# Patient Record
Sex: Female | Born: 1995
Health system: Southern US, Community
[De-identification: ages and names within clinical notes are randomized; demographics above are authoritative.]

## PROBLEM LIST (undated history)

## (undated) DIAGNOSIS — K219 Gastro-esophageal reflux disease without esophagitis: Secondary | ICD-10-CM

## (undated) DIAGNOSIS — O24419 Gestational diabetes mellitus in pregnancy, unspecified control: Secondary | ICD-10-CM

## (undated) DIAGNOSIS — R197 Diarrhea, unspecified: Secondary | ICD-10-CM

## (undated) DIAGNOSIS — I10 Essential (primary) hypertension: Secondary | ICD-10-CM

## (undated) DIAGNOSIS — E739 Lactose intolerance, unspecified: Secondary | ICD-10-CM

## (undated) DIAGNOSIS — F419 Anxiety disorder, unspecified: Secondary | ICD-10-CM

## (undated) HISTORY — PX: CHOLECYSTECTOMY: SHX55

## (undated) HISTORY — DX: Gestational diabetes mellitus in pregnancy, unspecified control: O24.419

## (undated) HISTORY — PX: OTHER SURGICAL HISTORY: SHX169

## (undated) HISTORY — DX: Lactose intolerance, unspecified: E73.9

## (undated) HISTORY — PX: WISDOM TOOTH EXTRACTION: SHX21

## (undated) HISTORY — DX: Essential (primary) hypertension: I10

---
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
## 2014-08-10 ENCOUNTER — Ambulatory Visit (INDEPENDENT_AMBULATORY_CARE_PROVIDER_SITE_OTHER): Payer: 59 | Admitting: Advanced Practice Midwife

## 2014-08-10 ENCOUNTER — Encounter: Payer: Self-pay | Admitting: Advanced Practice Midwife

## 2014-08-10 VITALS — BP 132/98 | Ht 66.0 in | Wt 240.0 lb

## 2014-08-10 DIAGNOSIS — Z309 Encounter for contraceptive management, unspecified: Secondary | ICD-10-CM

## 2014-08-10 DIAGNOSIS — Z3009 Encounter for other general counseling and advice on contraception: Secondary | ICD-10-CM

## 2014-08-10 MED ORDER — MISOPROSTOL 200 MCG PO TABS: 600.0000 ug | ORAL_TABLET | Freq: Once | ORAL | Status: DC

## 2014-08-10 NOTE — Patient Instructions (Signed)
Levonorgestrel intrauterine device (IUD) What is this medicine? LEVONORGESTREL IUD (LEE voe nor jes trel) is a contraceptive (birth control) device. The device is placed inside the uterus by a healthcare professional. It is used to prevent pregnancy and can also be used to treat heavy bleeding that occurs during your period. Depending on the device, it can be used for 3 to 5 years. This medicine may be used for other purposes; ask your health care provider or pharmacist if you have questions. COMMON BRAND NAME(S): LILETTA, Mirena, Skyla What should I tell my health care provider before I take this medicine? They need to know if you have any of these conditions: -abnormal Pap smear -cancer of the breast, uterus, or cervix -diabetes -endometritis -genital or pelvic infection now or in the past -have more than one sexual partner or your partner has more than one partner -heart disease -history of an ectopic or tubal pregnancy -immune system problems -IUD in place -liver disease or tumor -problems with blood clots or take blood-thinners -use intravenous drugs -uterus of unusual shape -vaginal bleeding that has not been explained -an unusual or allergic reaction to levonorgestrel, other hormones, silicone, or polyethylene, medicines, foods, dyes, or preservatives -pregnant or trying to get pregnant -breast-feeding How should I use this medicine? This device is placed inside the uterus by a health care professional. Talk to your pediatrician regarding the use of this medicine in children. Special care may be needed. Overdosage: If you think you have taken too much of this medicine contact a poison control center or emergency room at once. NOTE: This medicine is only for you. Do not share this medicine with others. What if I miss a dose? This does not apply. What may interact with this medicine? Do not take this medicine with any of the following  medications: -amprenavir -bosentan -fosamprenavir This medicine may also interact with the following medications: -aprepitant -barbiturate medicines for inducing sleep or treating seizures -bexarotene -griseofulvin -medicines to treat seizures like carbamazepine, ethotoin, felbamate, oxcarbazepine, phenytoin, topiramate -modafinil -pioglitazone -rifabutin -rifampin -rifapentine -some medicines to treat HIV infection like atazanavir, indinavir, lopinavir, nelfinavir, tipranavir, ritonavir -St. John's wort -warfarin This list may not describe all possible interactions. Give your health care provider a list of all the medicines, herbs, non-prescription drugs, or dietary supplements you use. Also tell them if you smoke, drink alcohol, or use illegal drugs. Some items may interact with your medicine. What should I watch for while using this medicine? Visit your doctor or health care professional for regular check ups. See your doctor if you or your partner has sexual contact with others, becomes HIV positive, or gets a sexual transmitted disease. This product does not protect you against HIV infection (AIDS) or other sexually transmitted diseases. You can check the placement of the IUD yourself by reaching up to the top of your vagina with clean fingers to feel the threads. Do not pull on the threads. It is a good habit to check placement after each menstrual period. Call your doctor right away if you feel more of the IUD than just the threads or if you cannot feel the threads at all. The IUD may come out by itself. You may become pregnant if the device comes out. If you notice that the IUD has come out use a backup birth control method like condoms and call your health care provider. Using tampons will not change the position of the IUD and are okay to use during your period. What side effects may   I notice from receiving this medicine? Side effects that you should report to your doctor or  health care professional as soon as possible: -allergic reactions like skin rash, itching or hives, swelling of the face, lips, or tongue -fever, flu-like symptoms -genital sores -high blood pressure -no menstrual period for 6 weeks during use -pain, swelling, warmth in the leg -pelvic pain or tenderness -severe or sudden headache -signs of pregnancy -stomach cramping -sudden shortness of breath -trouble with balance, talking, or walking -unusual vaginal bleeding, discharge -yellowing of the eyes or skin Side effects that usually do not require medical attention (report to your doctor or health care professional if they continue or are bothersome): -acne -breast pain -change in sex drive or performance -changes in weight -cramping, dizziness, or faintness while the device is being inserted -headache -irregular menstrual bleeding within first 3 to 6 months of use -nausea This list may not describe all possible side effects. Call your doctor for medical advice about side effects. You may report side effects to FDA at 1-800-FDA-1088. Where should I keep my medicine? This does not apply. NOTE: This sheet is a summary. It may not cover all possible information. If you have questions about this medicine, talk to your doctor, pharmacist, or health care provider.  2015, Elsevier/Gold Standard. (2011-08-28 13:54:04)  

## 2014-08-10 NOTE — Progress Notes (Signed)
[facility] Visit  Patient name: Alexandra Jones MRN [id number]  Date of birth: March 03, 1996  CC & HPI:  Alexandra Jones is a 18 y.o. Caucasian female presenting today for to discuss Mirena.  She is currently on COC's and is going abroad this summer--doesn't want to have to worry about taking a pill everyday and does not want Nexplanon  Pertinent History Reviewed:  Medical & Surgical Hx:   History reviewed. No pertinent past medical history. Past Surgical History  Procedure Laterality Date  . Cholecystectomy     Current outpatient prescriptions: norethindrone-ethinyl estradiol (MICROGESTIN,JUNEL,LOESTRIN) 1-20 MG-MCG tablet, Take 1 tablet by mouth daily., Disp: , Rfl: ;  misoprostol (CYTOTEC) 200 MCG tablet, Take 3 tablets (600 mcg total) by mouth once. 6 hours before your appointment, Disp: 3 tablet, Rfl: 0 Social History: Reviewed -  reports that she has never smoked. She has never used smokeless tobacco.  Objective Findings:  Vitals: BP 132/98 mmHg  Ht 5\' 6"  (1.676 m)  Wt 240 lb (108.863 kg)  BMI 38.76 kg/m2  LMP 07/11/2014  No results found for this or any previous visit (from the past 24 hour(s)).   Assessment & Plan:  A:   Contraception counseling P:  Cytotec 66mcg PO 6 hours before appt   F/U 1 week for IUD(lyletta).  Pt will be on her period then   CRESENZO-DISHMAN,Mamie Hundertmark CNM 08/10/2014 3:34 PM

## 2014-08-17 ENCOUNTER — Encounter: Payer: Self-pay | Admitting: Obstetrics and Gynecology

## 2014-08-17 ENCOUNTER — Ambulatory Visit (INDEPENDENT_AMBULATORY_CARE_PROVIDER_SITE_OTHER): Payer: 59 | Admitting: Obstetrics and Gynecology

## 2014-08-17 VITALS — BP 140/82 | Ht 66.0 in | Wt 236.0 lb

## 2014-08-17 DIAGNOSIS — Z30014 Encounter for initial prescription of intrauterine contraceptive device: Secondary | ICD-10-CM

## 2014-08-17 DIAGNOSIS — Z3200 Encounter for pregnancy test, result unknown: Secondary | ICD-10-CM

## 2014-08-17 DIAGNOSIS — Z3202 Encounter for pregnancy test, result negative: Secondary | ICD-10-CM

## 2014-08-17 DIAGNOSIS — Z113 Encounter for screening for infections with a predominantly sexual mode of transmission: Secondary | ICD-10-CM

## 2014-08-17 LAB — POCT URINE PREGNANCY: Preg Test, Ur: NEGATIVE

## 2014-08-17 NOTE — Progress Notes (Signed)
Patient ID: Alexandra Jones, female   DOB: 10/18/1995, 19 y.o.   MRN: [id number] Pt here today for IUD insertion. Pt last had intercourse on 08/05/14, Pt stopped taking her BCP on Saturday and started her period yesterday. Pt had a negative UPT today.  [place] PROCEDURE NOTE  Alexandra Jones is a 19 y.o. No obstetric history on file. here for Mirena IUD insertion. No GYN concerns.  No paps in past will do GC/ CHl  IUD Insertion Procedure Note Patient identified, informed consent performed, consent signed.   Discussed risks of irregular bleeding, cramping, infection, malpositioning or misplacement of the IUD outside the uterus which may require further procedure such as laparoscopy. Time out was performed.  Urine pregnancy test negative.  Speculum placed in the vagina.  Cervix visualized.  Cleaned with Betadine x 2.  Grasped anteriorly with a single tooth tenaculum.  Uterus sounded to 7 cm. Liletta IUD placed per manufacturer's recommendations.  Strings trimmed to 3 cm. Tenaculum was removed, good hemostasis noted.  Patient tolerated procedure well.  The prongs appear incompletely spread on u/s post procedure, but are within 1 cm of apex of endometrium  Patient was given post-procedure instructions.  She was advised to have backup contraception for one week.  Patient was also asked to check IUD strings periodically and follow up in 2-3weeks for IUD check.

## 2014-08-19 LAB — GC/CHLAMYDIA PROBE AMP
CT Probe RNA: NEGATIVE
GC Probe RNA: NEGATIVE

## 2014-09-07 ENCOUNTER — Other Ambulatory Visit: Payer: Self-pay | Admitting: Obstetrics and Gynecology

## 2014-09-07 DIAGNOSIS — Z30431 Encounter for routine checking of intrauterine contraceptive device: Secondary | ICD-10-CM

## 2014-09-14 ENCOUNTER — Ambulatory Visit (INDEPENDENT_AMBULATORY_CARE_PROVIDER_SITE_OTHER): Payer: 59

## 2014-09-14 ENCOUNTER — Ambulatory Visit (INDEPENDENT_AMBULATORY_CARE_PROVIDER_SITE_OTHER): Payer: 59 | Admitting: Obstetrics and Gynecology

## 2014-09-14 ENCOUNTER — Other Ambulatory Visit: Payer: Self-pay | Admitting: Obstetrics and Gynecology

## 2014-09-14 ENCOUNTER — Encounter: Payer: Self-pay | Admitting: Obstetrics and Gynecology

## 2014-09-14 VITALS — BP 130/60 | Wt 241.0 lb

## 2014-09-14 DIAGNOSIS — Z30431 Encounter for routine checking of intrauterine contraceptive device: Secondary | ICD-10-CM

## 2014-09-14 DIAGNOSIS — J069 Acute upper respiratory infection, unspecified: Secondary | ICD-10-CM

## 2014-09-14 MED ORDER — HYDROCODONE-HOMATROPINE 5-1.5 MG/5ML PO SYRP: 5.0000 mL | ORAL_SOLUTION | Freq: Four times a day (QID) | ORAL | Status: DC | PRN

## 2014-09-14 MED ORDER — DM-GUAIFENESIN ER 30-600 MG PO TB12: 1.0000 | ORAL_TABLET | Freq: Two times a day (BID) | ORAL | Status: DC

## 2014-09-14 MED ORDER — AZITHROMYCIN 1 G PO PACK: 1.0000 g | PACK | Freq: Once | ORAL | Status: DC

## 2014-09-14 NOTE — Progress Notes (Signed)
Patient ID: Alexandra Jones, female   DOB: 09/16/1995, 19 y.o.   MRN: [id number]   [facility] Visit  Patient name: Alexandra Jones MRN [id number]  Date of birth: 02-14-1996  CC & HPI:  Alexandra Jones is a 19 y.o. female presenting today for u/s to confirm IUD location, due to difficult  Insertion. GYNECOLOGIC SONOGRAM   Alexandra Jones is a 19 y.o. No obstetric history on file for a pelvic sonogram for ck IUD.  Uterus Anteverted   Endometrium IUD noted centrally within the endometrial caivty  Right ovary Appears WNL normal follicular pattern noted   Left ovary Appears WNL normal follicular pattern noted   No free fluid or adnexal masses noted within the pelvis  Technician Comments:  Anteverted uterus, IUD located centrally within the cavity, bilateral adnexa appears WNL   Lazarus Gowda 09/14/2014 3:38 PM   ROS:  Has had a nonproductive cough x 3 wk, and desires tx  Pertinent History Reviewed:   Reviewed: Significant for had normal liter than usual menses this month. Medical        History reviewed. No pertinent past medical history.                            Surgical Hx:    Past Surgical History  Procedure Laterality Date  . Cholecystectomy     Medications: Reviewed & Updated - see associated section                      No current outpatient prescriptions on file.   Social History: Reviewed -  reports that she has never smoked. She has never used smokeless tobacco.  Objective Findings:  Vitals: Blood pressure 130/60, weight 241 lb (109.317 kg), last menstrual period 08/16/2014.  Physical Examination: Physical Examination: General appearance - alert, well appearing, and in no distress, oriented to person, place, and time and overweight Mouth - mucous membranes moist, pharynx normal without lesions, tonsils normal, dental hygiene good, tongue normal and nonpurulent tonsils Neck - supple, no  significant adenopathy               Chest - clear to auscultation, no wheezes, rales or rhonchi, symmetric air entry, breathing deeply reproduces the cough Abdomen - soft, nontender, nondistended, no masses or organomegaly Pelvic - normal external genitalia, vulva, vagina, cervix, uterus and adnexa, CERVIX: normal appearing cervix without discharge or lesions, string short 5 mm but in place    Assessment & Plan:   A:1. iud recheck: confimed IUD location     2. URI with persistent cough P:  1. IUD use, expectations reviewed 2. Rx Azithromycin, Mucinex, Hycodan  This documentation was by [facility] and is considered accurate.

## 2014-09-14 NOTE — Progress Notes (Signed)
Patient ID: Alexandra Jones, female   DOB: 05-08-1996, 19 y.o.   MRN: [id number] Pt here today for follow up on IUD insertion.

## 2015-10-23 DIAGNOSIS — D235 Other benign neoplasm of skin of trunk: Secondary | ICD-10-CM | POA: Diagnosis not present

## 2015-12-26 ENCOUNTER — Telehealth: Payer: Self-pay | Admitting: Gastroenterology

## 2015-12-26 MED ORDER — RIFAXIMIN 200 MG PO TABS: 200.0000 mg | ORAL_TABLET | Freq: Three times a day (TID) | ORAL | Status: DC

## 2015-12-26 NOTE — Telephone Encounter (Signed)
ABX needed for travel. Rx sent.

## 2015-12-27 NOTE — Telephone Encounter (Signed)
Note on the bag AS DIRECTED BY DR.FIELDS since this is different strength than the one she sent to the pharmacy.  Call if you have questions.

## 2015-12-27 NOTE — Telephone Encounter (Signed)
T/C from Dr. Oneida Alar to leave samples of the Xifaxin 550 mg at front for pt. I am leaving #18 tablets at front for pt to pick up.

## 2015-12-30 NOTE — Telephone Encounter (Signed)
REVIEWED-NO ADDITIONAL RECOMMENDATIONS. 

## 2016-01-28 ENCOUNTER — Other Ambulatory Visit (HOSPITAL_COMMUNITY)
Admission: RE | Admit: 2016-01-28 | Discharge: 2016-01-28 | Disposition: A | Discharge status: Home / Self Care | Payer: 59 | Source: Ambulatory Visit | Attending: Pathology | Admitting: Pathology

## 2016-01-28 DIAGNOSIS — R21 Rash and other nonspecific skin eruption: Secondary | ICD-10-CM | POA: Insufficient documentation

## 2016-01-28 LAB — CBC WITH DIFFERENTIAL/PLATELET
Basophils Absolute: 0.2 10*3/uL — ABNORMAL HIGH (ref 0.0–0.1)
Basophils Relative: 3 %
Eosinophils Absolute: 0.1 10*3/uL (ref 0.0–0.7)
Eosinophils Relative: 2 %
HCT: 39.8 % (ref 36.0–46.0)
Hemoglobin: 13.6 g/dL (ref 12.0–15.0)
Lymphocytes Relative: 43 %
Lymphs Abs: 2.2 10*3/uL (ref 0.7–4.0)
MCH: 30 pg (ref 26.0–34.0)
MCHC: 34.2 g/dL (ref 30.0–36.0)
MCV: 87.7 fL (ref 78.0–100.0)
Monocytes Absolute: 0.6 10*3/uL (ref 0.1–1.0)
Monocytes Relative: 13 %
Neutro Abs: 1.9 10*3/uL (ref 1.7–7.7)
Neutrophils Relative %: 39 %
Platelets: 132 10*3/uL — ABNORMAL LOW (ref 150–400)
RBC: 4.54 MIL/uL (ref 3.87–5.11)
RDW: 13.2 % (ref 11.5–15.5)
WBC: 5 10*3/uL (ref 4.0–10.5)

## 2016-01-28 LAB — COMPREHENSIVE METABOLIC PANEL
ALT: 48 U/L (ref 14–54)
AST: 45 U/L — ABNORMAL HIGH (ref 15–41)
Albumin: 4.3 g/dL (ref 3.5–5.0)
Alkaline Phosphatase: 56 U/L (ref 38–126)
Anion gap: 7 (ref 5–15)
BUN: 11 mg/dL (ref 6–20)
CO2: 24 mmol/L (ref 22–32)
Calcium: 9 mg/dL (ref 8.9–10.3)
Chloride: 106 mmol/L (ref 101–111)
Creatinine, Ser: 0.74 mg/dL (ref 0.44–1.00)
GFR calc Af Amer: >60 mL/min (ref >60)
GFR calc non Af Amer: >60 mL/min (ref >60)
Glucose, Bld: 98 mg/dL (ref 65–99)
Potassium: 4 mmol/L (ref 3.5–5.1)
Sodium: 137 mmol/L (ref 135–145)
Total Bilirubin: 0.5 mg/dL (ref 0.3–1.2)
Total Protein: 7.8 g/dL (ref 6.5–8.1)

## 2016-01-29 LAB — VARICELLA ZOSTER ANTIBODY, IGM: Varicella-Zoster Ab, IgM: 1.88 index — ABNORMAL HIGH (ref 0.00–0.90)

## 2016-01-29 LAB — VARICELLA ZOSTER ANTIBODY, IGG: Varicella IgG: 879 index (ref >165)

## 2016-01-31 DIAGNOSIS — L410 Pityriasis lichenoides et varioliformis acuta: Secondary | ICD-10-CM | POA: Diagnosis not present

## 2016-03-20 DIAGNOSIS — L905 Scar conditions and fibrosis of skin: Secondary | ICD-10-CM | POA: Diagnosis not present

## 2016-03-20 DIAGNOSIS — B09 Unspecified viral infection characterized by skin and mucous membrane lesions: Secondary | ICD-10-CM | POA: Diagnosis not present

## 2016-06-03 ENCOUNTER — Other Ambulatory Visit: Payer: Self-pay | Admitting: Obstetrics and Gynecology

## 2016-06-03 DIAGNOSIS — T839XXA Unspecified complication of genitourinary prosthetic device, implant and graft, initial encounter: Secondary | ICD-10-CM

## 2016-06-03 DIAGNOSIS — T8389XA Other specified complication of genitourinary prosthetic devices, implants and grafts, initial encounter: Principal | ICD-10-CM

## 2016-06-03 DIAGNOSIS — T8332XA Displacement of intrauterine contraceptive device, initial encounter: Secondary | ICD-10-CM

## 2016-06-03 NOTE — Progress Notes (Signed)
Ultrasound non--ob to be ordered for Saturday 10/28 due to missing IUD

## 2016-06-07 ENCOUNTER — Ambulatory Visit (HOSPITAL_COMMUNITY)
Admission: RE | Admit: 2016-06-07 | Discharge: 2016-06-07 | Disposition: A | Discharge status: Home / Self Care | Payer: 59 | Source: Ambulatory Visit | Attending: Obstetrics and Gynecology | Admitting: Obstetrics and Gynecology

## 2016-06-07 DIAGNOSIS — Z975 Presence of (intrauterine) contraceptive device: Secondary | ICD-10-CM | POA: Diagnosis not present

## 2016-06-07 DIAGNOSIS — T839XXA Unspecified complication of genitourinary prosthetic device, implant and graft, initial encounter: Secondary | ICD-10-CM

## 2016-06-07 DIAGNOSIS — T8332XA Displacement of intrauterine contraceptive device, initial encounter: Secondary | ICD-10-CM | POA: Diagnosis not present

## 2016-08-20 DIAGNOSIS — L905 Scar conditions and fibrosis of skin: Secondary | ICD-10-CM | POA: Diagnosis not present

## 2016-09-13 ENCOUNTER — Telehealth: Payer: 59 | Admitting: Nurse Practitioner

## 2016-09-13 DIAGNOSIS — J111 Influenza due to unidentified influenza virus with other respiratory manifestations: Secondary | ICD-10-CM

## 2016-09-13 MED ORDER — OSELTAMIVIR PHOSPHATE 75 MG PO CAPS: 75.0000 mg | ORAL_CAPSULE | Freq: Two times a day (BID) | ORAL | 0 refills | Status: DC

## 2016-09-13 NOTE — Progress Notes (Signed)

## 2016-10-03 DIAGNOSIS — L905 Scar conditions and fibrosis of skin: Secondary | ICD-10-CM | POA: Diagnosis not present

## 2016-10-05 ENCOUNTER — Telehealth: Payer: 59 | Admitting: Family

## 2016-10-05 DIAGNOSIS — J329 Chronic sinusitis, unspecified: Secondary | ICD-10-CM | POA: Diagnosis not present

## 2016-10-05 DIAGNOSIS — B9689 Other specified bacterial agents as the cause of diseases classified elsewhere: Secondary | ICD-10-CM | POA: Diagnosis not present

## 2016-10-05 MED ORDER — DOXYCYCLINE HYCLATE 100 MG PO TABS: 100.0000 mg | ORAL_TABLET | Freq: Two times a day (BID) | ORAL | 0 refills | Status: DC

## 2016-10-05 NOTE — Progress Notes (Signed)

## 2016-10-10 DIAGNOSIS — M26609 Unspecified temporomandibular joint disorder, unspecified side: Secondary | ICD-10-CM | POA: Diagnosis not present

## 2016-10-10 DIAGNOSIS — H6982 Other specified disorders of Eustachian tube, left ear: Secondary | ICD-10-CM | POA: Diagnosis not present

## 2017-02-09 DIAGNOSIS — K319 Disease of stomach and duodenum, unspecified: Secondary | ICD-10-CM | POA: Diagnosis not present

## 2017-02-09 DIAGNOSIS — R1013 Epigastric pain: Secondary | ICD-10-CM | POA: Diagnosis not present

## 2017-02-09 DIAGNOSIS — Z9049 Acquired absence of other specified parts of digestive tract: Secondary | ICD-10-CM | POA: Diagnosis not present

## 2017-02-20 DIAGNOSIS — Z6836 Body mass index (BMI) 36.0-36.9, adult: Secondary | ICD-10-CM | POA: Diagnosis not present

## 2017-02-20 DIAGNOSIS — Z0000 Encounter for general adult medical examination without abnormal findings: Secondary | ICD-10-CM | POA: Diagnosis not present

## 2017-02-20 DIAGNOSIS — R1013 Epigastric pain: Secondary | ICD-10-CM | POA: Diagnosis not present

## 2017-02-20 DIAGNOSIS — F419 Anxiety disorder, unspecified: Secondary | ICD-10-CM | POA: Diagnosis not present

## 2017-03-13 ENCOUNTER — Encounter: Payer: Self-pay | Admitting: Internal Medicine

## 2017-05-01 ENCOUNTER — Ambulatory Visit (INDEPENDENT_AMBULATORY_CARE_PROVIDER_SITE_OTHER): Payer: 59 | Admitting: Gastroenterology

## 2017-05-01 ENCOUNTER — Encounter: Payer: Self-pay | Admitting: Gastroenterology

## 2017-05-01 VITALS — BP 112/68 | HR 72 | Temp 97.4°F | Ht 65.0 in | Wt 226.6 lb

## 2017-05-01 DIAGNOSIS — R1010 Upper abdominal pain, unspecified: Secondary | ICD-10-CM | POA: Diagnosis not present

## 2017-05-01 DIAGNOSIS — K625 Hemorrhage of anus and rectum: Secondary | ICD-10-CM | POA: Diagnosis not present

## 2017-05-01 DIAGNOSIS — R197 Diarrhea, unspecified: Secondary | ICD-10-CM | POA: Insufficient documentation

## 2017-05-01 DIAGNOSIS — R109 Unspecified abdominal pain: Secondary | ICD-10-CM | POA: Insufficient documentation

## 2017-05-01 HISTORY — DX: Diarrhea, unspecified: R19.7

## 2017-05-01 NOTE — Progress Notes (Addendum)
REVIEWED-NO ADDITIONAL RECOMMENDATIONS.  Primary Care Physician:  Deloria Lair., MD  Primary Gastroenterologist:  Barney Drain, MD   Chief Complaint  Patient presents with  . Abdominal Pain    HPI:  Alexandra Jones is a 21 y.o. female here at the request of Dr. Scotty Court for further evaluation of abdominal pain, diarrhea.   Patient states she's had problems with her stomach ever since she had her gallbladder out at age 66. She may go a couple weeks at a time without any major issues but then she'll develop for 5 days of explosive diarrhea, 5-10 times daily and sometimes associated with bright red blood per rectum which she feels is from anorectal irritation. During these episodes she'll have abdominal cramping and discomfort. Passes hot yellow liquid stool. PCP tried Prilosec in the morning and Zantac in the evening without relief. Denies significant heartburn. Only rarely with certain foods. Denies vomiting or dysphagia. Complains of nocturnal diarrhea with these episodes. In between episodes she may have regular bowel movements or even be constipated. She has utilized Imodium with some relief for her diarrhea in the past. Avoids laxatives.  Travel to [place] in December 2017. Works at [employer]. Plans to go to get her masters for occupational therapy.  No current outpatient prescriptions on file.   No current facility-administered medications for this visit.     Allergies as of 05/01/2017 - Review Complete 05/01/2017  Allergen Reaction Noted  . Penicillins Rash 08/10/2014    History reviewed. No pertinent past medical history.  Past Surgical History:  Procedure Laterality Date  . CHOLECYSTECTOMY      Family History  Problem Relation Age of Onset  . Hypertension Maternal Grandmother   . Cancer Maternal Grandmother        breast  . Cancer Maternal Grandfather        prostate  . Colon cancer Maternal Grandfather        over 20  . Celiac disease Neg Hx   . Crohn's disease  Neg Hx   . Ulcerative colitis Neg Hx     Social History   Social History  . Marital status: Single    Spouse name: N/A  . Number of children: N/A  . Years of education: N/A   Occupational History  . Not on file.   Social History Main Topics  . Smoking status: Never Smoker  . Smokeless tobacco: Never Used  . Alcohol use No  . Drug use: No  . Sexual activity: Yes    Birth control/ protection: IUD   Other Topics Concern  . Not on file   Social History Narrative  . No narrative on file      ROS:  General: Negative for anorexia, Unintentional weight loss, fever, chills, fatigue, weakness. Eyes: Negative for vision changes.  ENT: Negative for hoarseness, difficulty swallowing , nasal congestion. CV: Negative for chest pain, angina, palpitations, dyspnea on exertion, peripheral edema.  Respiratory: Negative for dyspnea at rest, dyspnea on exertion, cough, sputum, wheezing.  GI: See history of present illness. GU:  Negative for dysuria, hematuria, urinary incontinence, urinary frequency, nocturnal urination.  MS: Negative for joint pain, low back pain.  Derm: Negative for rash or itching.  Neuro: Negative for weakness, abnormal sensation, seizure, frequent headaches, memory loss, confusion.  Psych: Negative for anxiety, depression, suicidal ideation, hallucinations.  Endo: Negative for unusual weight change.  Heme: Negative for bruising or bleeding. Allergy: Negative for rash or hives.    Physical Examination:  BP 112/68  Pulse 72   Temp (!) 97.4 F (36.3 C) (Oral)   Ht 5\' 5"  (1.651 m)   Wt 226 lb 9.6 oz (102.8 kg)   LMP  (LMP Unknown) Comment: IUD  BMI 37.71 kg/m    General: Well-nourished, well-developed in no acute distress.  Head: Normocephalic, atraumatic.   Eyes: Conjunctiva pink, no icterus. Mouth: Oropharyngeal mucosa moist and pink , no lesions erythema or exudate. Neck: Supple without thyromegaly, masses, or lymphadenopathy.  Lungs: Clear to  auscultation bilaterally.  Heart: Regular rate and rhythm, no murmurs rubs or gallops.  Abdomen: Bowel sounds are normal, nontender, nondistended, no hepatosplenomegaly or masses, no abdominal bruits or    hernia , no rebound or guarding.   Rectal: Not performed Extremities: No lower extremity edema. No clubbing or deformities.  Neuro: Alert and oriented x 4 , grossly normal neurologically.  Skin: Warm and dry, no rash or jaundice.   Psych: Alert and cooperative, normal mood and affect.

## 2017-05-01 NOTE — Assessment & Plan Note (Signed)
Intermittent explosive diarrhea lasting for days at a time but may go weeks in between without symptoms. Associated with abdominal pain/cramping and sometimes minimal hematochezia. Symptoms occurred after cholecystectomy at age 21. She may have IBS with benign anorectal bleeding versus bile salt diarrhea. Cannot exclude IBD, celiac, malignancy. Less likely infectious etiology given intermittent nature of symptoms.   Recommend labs. Anticipate colonoscopy in the future. Further recommendations to follow

## 2017-05-01 NOTE — Patient Instructions (Signed)
1. Please have your labs done and make sure we get a copy. Our fax number is [phone].

## 2017-05-04 NOTE — Progress Notes (Signed)
CC'D TO PCP °

## 2017-05-08 ENCOUNTER — Telehealth: Payer: Self-pay

## 2017-05-08 NOTE — Telephone Encounter (Signed)
Lab results received and in file box for Alexandra Jones, [place].

## 2017-05-09 LAB — TISSUE TRANSGLUTAMINASE, IGA: Transglutaminase IgA: <2 U/mL (ref 0–3)

## 2017-05-18 NOTE — Telephone Encounter (Signed)
See result note.  

## 2017-05-18 NOTE — Progress Notes (Signed)
LMOM to call.

## 2017-05-18 NOTE — Progress Notes (Signed)
Labs came over fax from [facility] thru patient employer and not all came over electronically therefore will have them scanned.  Dated 05/06/2018 White blood cell count 7300, hemoglobin 12.9, hematocrit 39.2, platelets 2 in 14,000, glucose 91, BUN 11, creatinine 0.76, sodium 142, potassium 4.6, total bilirubin 0.3, alkaline phosphatase 53, AST 13, ALT 17, albumin 4.2, TSH 2.4, IgA 109 normal  TTG was less than 2 as above.   Proceed with colonoscopy with Dr. Oneida Alar for further evaluation of chronic diarrhea, rectal bleeding if patient agreeable. Augment conscious sedation with Phenergan 25 mg IV 45 minutes before the procedure.

## 2017-05-18 NOTE — Progress Notes (Signed)
Pt is aware and OK to schedule colonoscopy.

## 2017-05-18 NOTE — Progress Notes (Signed)
To Alexandra Jones.

## 2017-05-19 ENCOUNTER — Other Ambulatory Visit: Payer: Self-pay

## 2017-05-19 DIAGNOSIS — K529 Noninfective gastroenteritis and colitis, unspecified: Secondary | ICD-10-CM

## 2017-05-19 DIAGNOSIS — K625 Hemorrhage of anus and rectum: Secondary | ICD-10-CM

## 2017-05-19 MED ORDER — PEG 3350-KCL-NA BICARB-NACL 420 G PO SOLR: 4000.0000 mL | ORAL | 0 refills | Status: DC

## 2017-06-11 IMAGING — US US TRANSVAGINAL NON-OB
1 series · 14 of 25 positions shown · non-contrast
Comparison: 09/14/2014.

CLINICAL DATA: Unable to locate IUD string.

EXAM:
TRANSABDOMINAL AND TRANSVAGINAL ULTRASOUND OF PELVIS
TECHNIQUE: Both transabdominal and transvaginal ultrasound examinations of the
pelvis were performed. Transabdominal technique was performed for
global imaging of the pelvis including uterus, ovaries, adnexal
regions, and pelvic cul-de-sac. It was necessary to proceed with
endovaginal exam following the transabdominal exam to visualize the
endometrium in better detail.

[Series 1: us transvaginal non-ob · 0.20mm/px · 14 of 125 slices shown]
[im 1/125]
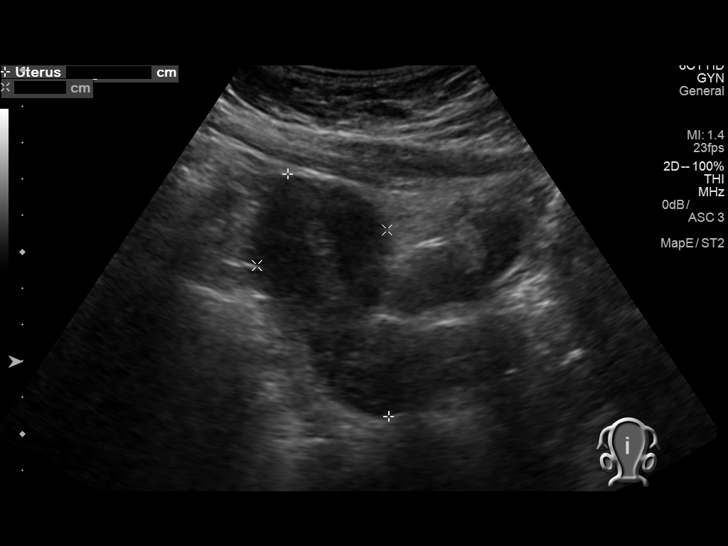
[im 11/125]
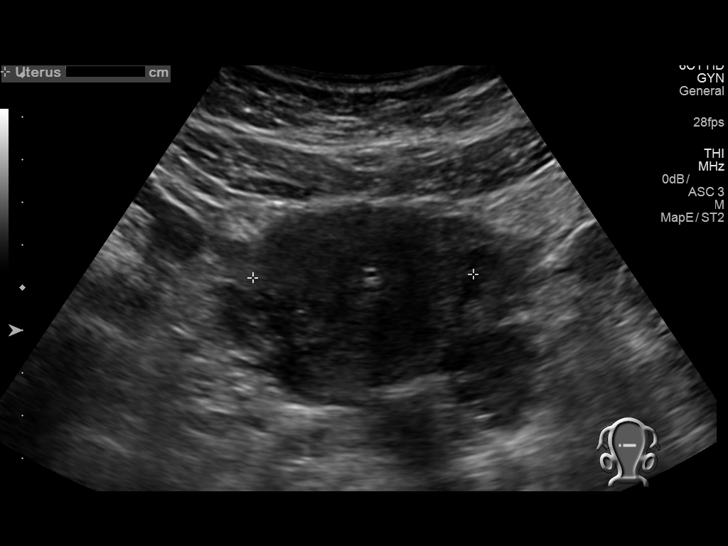
[im 21/125]
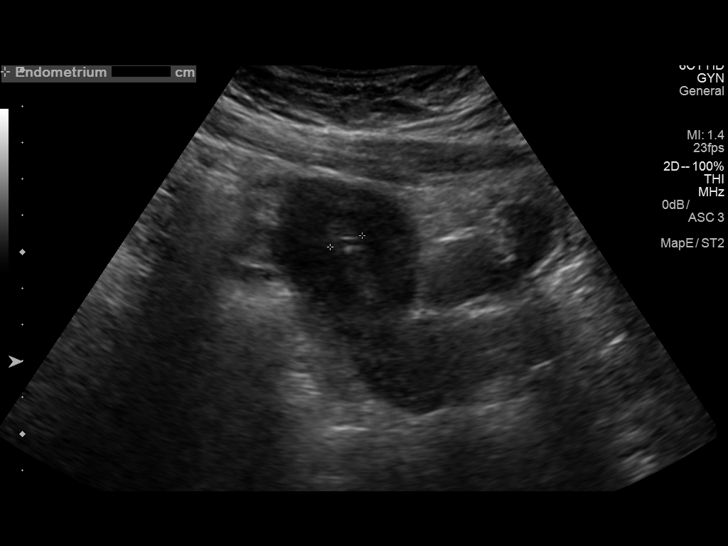
[im 32/125]
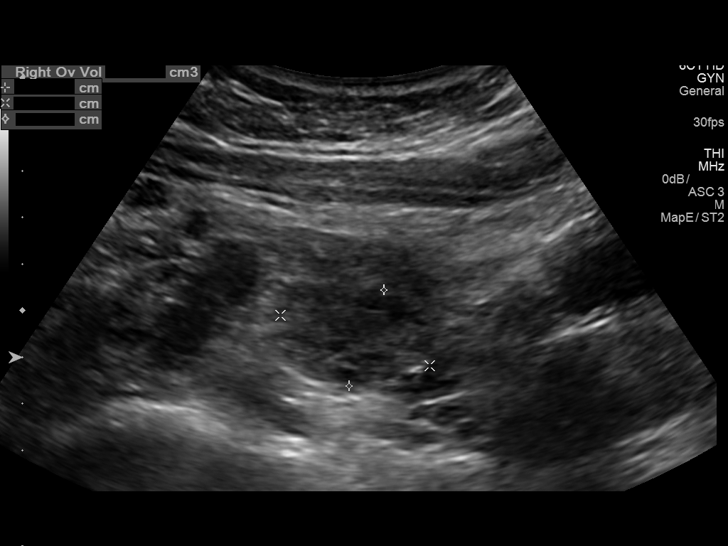
[im 42/125]
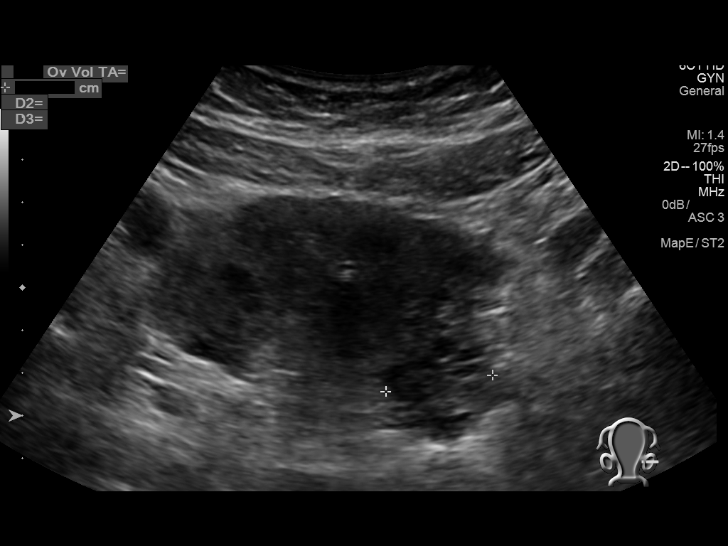
[im 47/125]
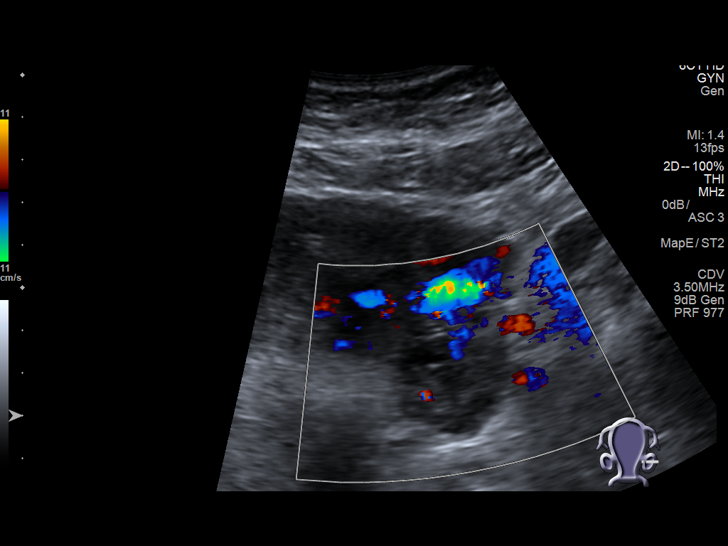
[im 57/125]
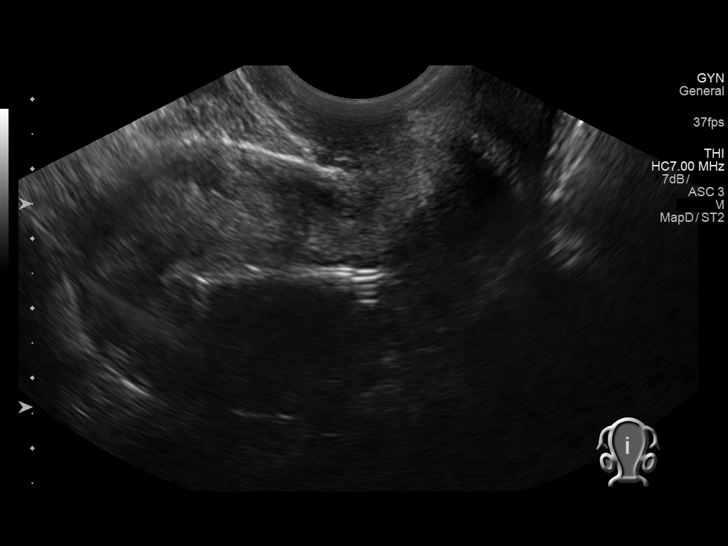
[im 68/125]
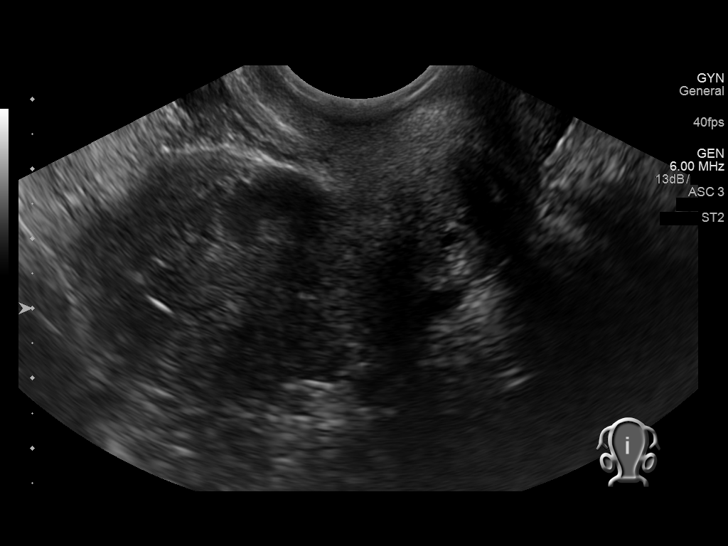
[im 78/125]
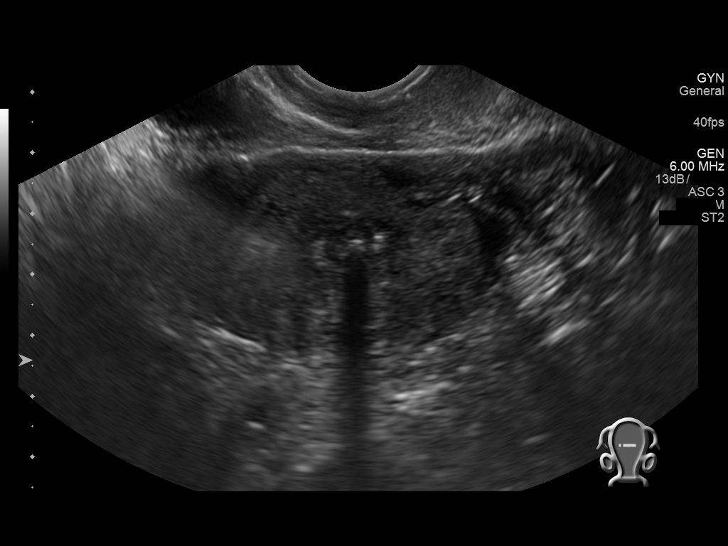
[im 83/125]
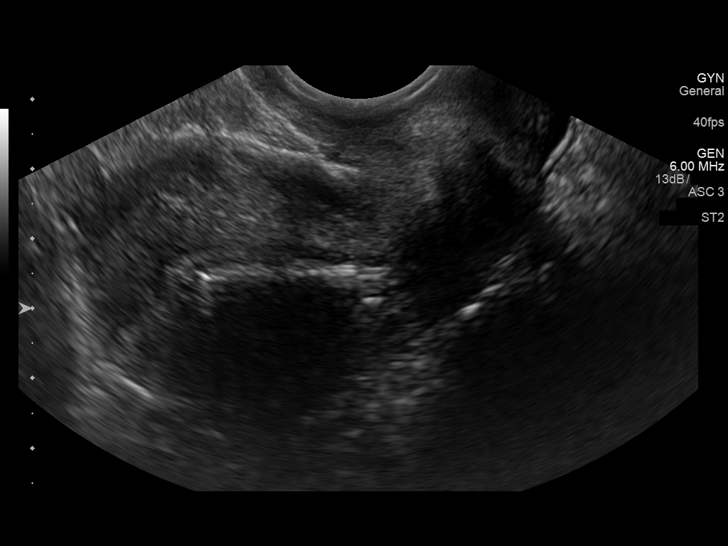
[im 94/125]
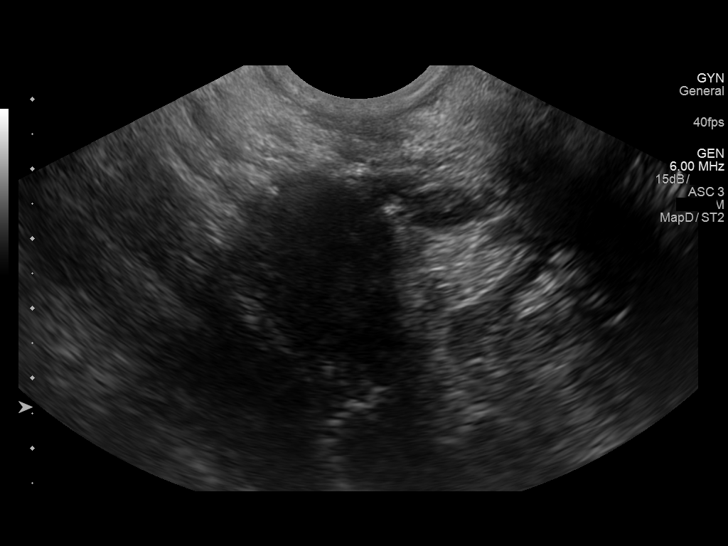
[im 104/125]
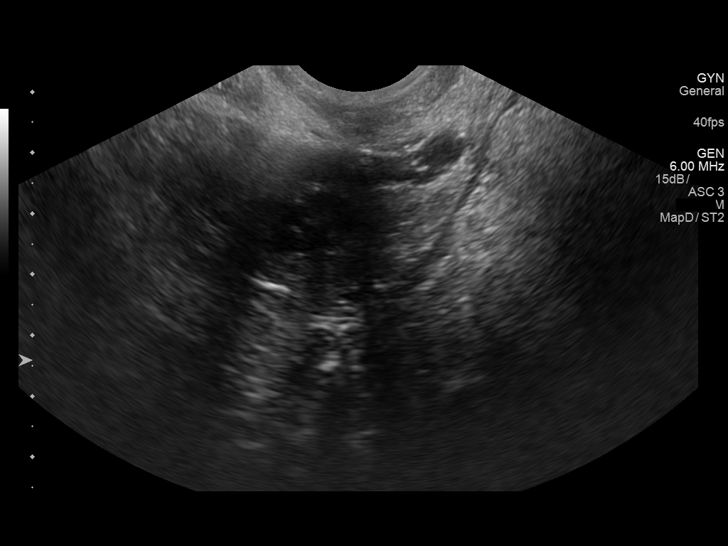
[im 114/125]
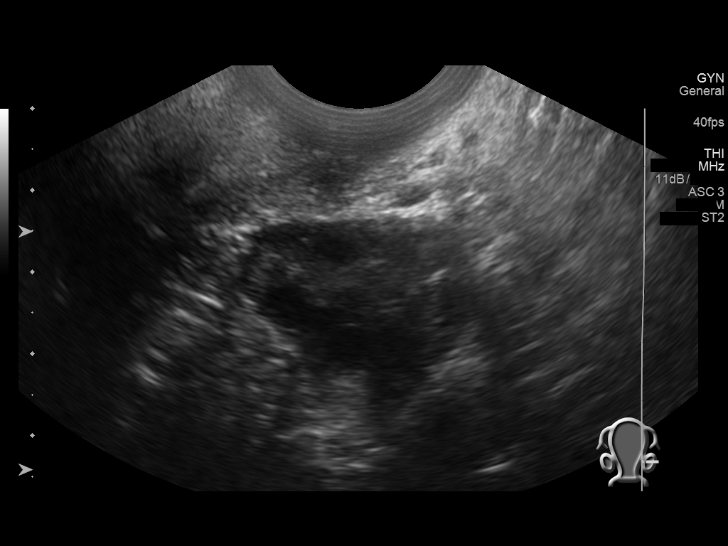
[im 125/125]
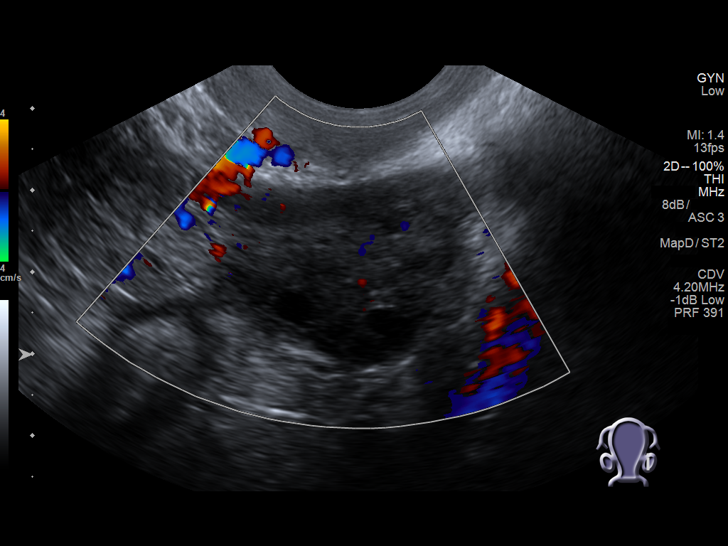

[14 of 25 positions shown; findings below may reference images not displayed]

FINDINGS: Uterus

Measurements: 7.3 x 5.1 x 3.7 cm. No fibroids or other mass
visualized.

Endometrium

Thickness: 9.2 mm. IUD in expected position within the endometrial
canal.

Right ovary

Measurements: 3.7 x 2.8 x 2.2 cm. Normal appearance/no adnexal mass.

Left ovary

Measurements: 3.3 x 2.7 x 2.0 cm. Normal appearance/no adnexal mass.

Other findings

No abnormal free fluid.
IMPRESSION: Normally positioned intrauterine device.  No abnormality seen.

## 2017-06-18 ENCOUNTER — Telehealth: Payer: Self-pay

## 2017-06-18 NOTE — Telephone Encounter (Signed)
Called pt. TCS for 06/22/17 moved up to 9:15am, pt to arrive at 8:15am. She is aware to start drinking prep at 4:15am and be NPO after 6:15am.

## 2017-06-22 ENCOUNTER — Encounter (HOSPITAL_COMMUNITY): Payer: Self-pay | Admitting: *Deleted

## 2017-06-22 ENCOUNTER — Ambulatory Visit (HOSPITAL_COMMUNITY)
Admission: RE | Admit: 2017-06-22 | Discharge: 2017-06-22 | Disposition: A | Discharge status: Home / Self Care | Payer: Commercial Managed Care - PPO | Source: Ambulatory Visit | Attending: Gastroenterology | Admitting: Gastroenterology

## 2017-06-22 ENCOUNTER — Encounter (HOSPITAL_COMMUNITY)
Admission: RE | Disposition: A | Discharge status: Home / Self Care | Payer: Self-pay | Source: Ambulatory Visit | Attending: Gastroenterology

## 2017-06-22 ENCOUNTER — Other Ambulatory Visit: Payer: Self-pay

## 2017-06-22 DIAGNOSIS — K529 Noninfective gastroenteritis and colitis, unspecified: Secondary | ICD-10-CM | POA: Diagnosis not present

## 2017-06-22 DIAGNOSIS — K219 Gastro-esophageal reflux disease without esophagitis: Secondary | ICD-10-CM | POA: Insufficient documentation

## 2017-06-22 DIAGNOSIS — Z803 Family history of malignant neoplasm of breast: Secondary | ICD-10-CM | POA: Insufficient documentation

## 2017-06-22 DIAGNOSIS — Z880 Allergy status to penicillin: Secondary | ICD-10-CM | POA: Diagnosis not present

## 2017-06-22 DIAGNOSIS — Z79899 Other long term (current) drug therapy: Secondary | ICD-10-CM | POA: Diagnosis not present

## 2017-06-22 DIAGNOSIS — K921 Melena: Secondary | ICD-10-CM | POA: Insufficient documentation

## 2017-06-22 DIAGNOSIS — K625 Hemorrhage of anus and rectum: Secondary | ICD-10-CM | POA: Diagnosis not present

## 2017-06-22 DIAGNOSIS — Q438 Other specified congenital malformations of intestine: Secondary | ICD-10-CM | POA: Insufficient documentation

## 2017-06-22 DIAGNOSIS — Z8042 Family history of malignant neoplasm of prostate: Secondary | ICD-10-CM | POA: Diagnosis not present

## 2017-06-22 DIAGNOSIS — Z8249 Family history of ischemic heart disease and other diseases of the circulatory system: Secondary | ICD-10-CM | POA: Insufficient documentation

## 2017-06-22 DIAGNOSIS — R197 Diarrhea, unspecified: Secondary | ICD-10-CM | POA: Diagnosis present

## 2017-06-22 DIAGNOSIS — Z800 Family history of malignant neoplasm of digestive organs: Secondary | ICD-10-CM | POA: Diagnosis not present

## 2017-06-22 DIAGNOSIS — K648 Other hemorrhoids: Secondary | ICD-10-CM | POA: Insufficient documentation

## 2017-06-22 HISTORY — DX: Gastro-esophageal reflux disease without esophagitis: K21.9

## 2017-06-22 HISTORY — PX: COLONOSCOPY: SHX5424

## 2017-06-22 SURGERY — COLONOSCOPY
Anesthesia: Moderate Sedation

## 2017-06-22 MED ORDER — MIDAZOLAM HCL 5 MG/5ML IJ SOLN
INTRAMUSCULAR | Status: DC | PRN
  Administered 2017-06-22 (×4): 2 mg via INTRAVENOUS

## 2017-06-22 MED ORDER — SODIUM CHLORIDE 0.9% FLUSH
INTRAVENOUS | Status: AC
  Filled 2017-06-22: qty 10

## 2017-06-22 MED ORDER — PROMETHAZINE HCL 25 MG/ML IJ SOLN
25.0000 mg | Freq: Once | INTRAMUSCULAR | Status: AC
  Administered 2017-06-22: 25 mg via INTRAVENOUS

## 2017-06-22 MED ORDER — MEPERIDINE HCL 100 MG/ML IJ SOLN
INTRAMUSCULAR | Status: DC | PRN
  Administered 2017-06-22: 50 mg
  Administered 2017-06-22 (×2): 25 mg

## 2017-06-22 MED ORDER — SODIUM CHLORIDE 0.9 % IV SOLN
INTRAVENOUS | Status: DC
  Administered 2017-06-22: 09:00:00 via INTRAVENOUS

## 2017-06-22 MED ORDER — MIDAZOLAM HCL 5 MG/5ML IJ SOLN
INTRAMUSCULAR | Status: AC
  Filled 2017-06-22: qty 10

## 2017-06-22 MED ORDER — MEPERIDINE HCL 100 MG/ML IJ SOLN
INTRAMUSCULAR | Status: AC
  Filled 2017-06-22: qty 2

## 2017-06-22 MED ORDER — PROMETHAZINE HCL 25 MG/ML IJ SOLN
INTRAMUSCULAR | Status: AC
  Filled 2017-06-22: qty 1

## 2017-06-22 NOTE — Op Note (Signed)
[facility] Patient Name: Alexandra Jones Procedure Date: 06/22/2017 8:58 AM MRN: [id number] Date of Birth: Sep 11, 1995 Attending MD: Barney Drain MD, MD CSN: [id number] Age: 21 Admit Type: Outpatient Procedure:                Colonoscopy with COLD FORCEPS BIOPSY Indications:              Clinically significant diarrhea of unexplained                            origin-INTERMITTENT AND ASSOCIATED Hematochezia,                            PSHx: CHOLECYSTECTOMY Providers:                Barney Drain MD, MD, Janeece Riggers, RN, Aram Candela Referring MD:             Zella Richer. Scotty Court, MD Medicines:                Promethazine 25 mg IV, Meperidine 100 mg IV,                            Midazolam 7 mg IV Complications:            No immediate complications. Estimated Blood Loss:     Estimated blood loss was minimal. Procedure:                Pre-Anesthesia Assessment:                           - Prior to the procedure, a History and Physical                            was performed, and patient medications and                            allergies were reviewed. The patient's tolerance of                            previous anesthesia was also reviewed. The risks                            and benefits of the procedure and the sedation                            options and risks were discussed with the patient.                            All questions were answered, and informed consent                            was obtained. Prior Anticoagulants: The patient has                            taken ibuprofen, last dose was 7 days prior to  procedure. ASA Grade Assessment: I - A normal,                            healthy patient. After reviewing the risks and                            benefits, the patient was deemed in satisfactory                            condition to undergo the procedure. After obtaining                            informed consent, the colonoscope  was passed under                            direct vision. Throughout the procedure, the                            patient's blood pressure, pulse, and oxygen                            saturations were monitored continuously. The                            EC-[id number] ([id number]) scope was introduced through                            the anus and advanced to the 15 cm into the ileum.                            The colonoscopy was somewhat difficult due to a                            tortuous colon. Successful completion of the                            procedure was aided by COLOWRAP. The patient                            tolerated the procedure well. The quality of the                            bowel preparation was good. The terminal ileum,                            ileocecal valve, appendiceal orifice, and rectum                            were photographed. Scope In: 9:36:10 AM Scope Out: 9:53:53 AM Scope Withdrawal Time: 0 hours 14 minutes 13 seconds  Total Procedure Duration: 0 hours 17 minutes 43 seconds  Findings:      The terminal ileum appeared normal.      The recto-sigmoid colon and sigmoid colon were mildly redundant.  Biopsies for histology were taken with a cold forceps from the cecum,       ascending colon, transverse colon, descending colon and sigmoid colon       for evaluation of microscopic colitis.      Internal hemorrhoids were found during retroflexion. The hemorrhoids       were small. Impression:               - The examined portion of the ileum was normal.                           - Redundant LEFT colon.                           - RECTAL BLEEDING DUE TO Internal hemorrhoids.                           - INTERMITTENT DIARRHEA MOST LIKELY DUE TO BILE                            SALT INDUCED DIARRHEA, LESS LIKELY MICROSCOPIC                            COLITIS Moderate Sedation:      Moderate (conscious) sedation was administered by the endoscopy nurse        and supervised by the endoscopist. The following parameters were       monitored: oxygen saturation, heart rate, blood pressure, and response       to care. Total physician intraservice time was 33 minutes. Recommendation:           - High fiber diet and low fat diet.                           - Continue present medications. CHEW ONE TUMES WITH                            MEALS UP TO THREE IMES A DAY [place].                           - Await pathology results.                           - Return to my office in 3 months.                           - Patient has a contact number available for                            emergencies. The signs and symptoms of potential                            delayed complications were discussed with the                            patient. Return to normal activities tomorrow.  Written discharge instructions were provided to the                            patient.                           - Repeat colonoscopy [day] AGE 85 FOR SURVEILLANCE. Procedure Code(s):        --- Professional ---                           [phone], Colonoscopy, flexible; with biopsy, single                            or multiple                           99152, Moderate sedation services provided by the                            same physician or other qualified health care                            professional performing the diagnostic or                            therapeutic service that the sedation supports,                            requiring the presence of an independent trained                            observer to assist in the monitoring of the                            patient's level of consciousness and physiological                            status; initial 15 minutes of intraservice time,                            patient age 53 years or older                           [phone], Moderate sedation services; each additional                             15 minutes intraservice time Diagnosis Code(s):        --- Professional ---                           K64.8, Other hemorrhoids                           R19.7, Diarrhea, unspecified  K92.1, Melena (includes Hematochezia)                           Q43.8, Other specified congenital malformations of                            intestine CPT copyright 2016 [employer]. All rights reserved. The codes documented in this report are preliminary and upon coder review may  be revised to meet current compliance requirements. Barney Drain, MD Barney Drain MD, MD 06/22/2017 10:04:57 AM This report has been signed electronically. Number of Addenda: 0

## 2017-06-22 NOTE — Discharge Instructions (Signed)
THE LAST PART OF YOUR SMALL BOWEL IS NORMAL. YOUR LOOSE STOOLS ARE MOST LIKELY DUE TO NOT HAVING A GALLBALDDR. YOU DID NOT HAVE ANY POLYPS. You have  internal hemorrhoids , which is the most cause for your rectal bleeding. I BIOPSIED YOUR COLON.   CONTINUE YOUR WEIGHT LOSS EFFORTS.  WHILE I DO NOT WANT TO ALARM YOU, YOUR BODY MASS INDEX IS OVER 30 WHICH MEANS YOU ARE OBESE. OBESITY CAN ACTIVATE CANCER GENES. OBESITY IS ASSOCIATED WITH AN INCREASED RISK FOR CIRRHOSIS AND ALL CANCERS, INCLUDING ESOPHAGEAL AND COLON CANCER.   Strictly FOLLOW A HIGH FIBER/LOW FAT DIET. AVOID ITEMS THAT CAUSE BLOATING. MEATS SHOULD BE BAKED, BROILED, OR BOILED. AVOID FRIED FOODS. SEE INFO BELOW.   TO PREVENT DIARRHEA, CHEW ONE TUMS WITH MEALS THREE TIMES A DAY.  IF NEEDED USE IMODIUM ONE 30 MINUTES THREE TIMES A DAY WITH MEALS AND AT BEDTIME TO PREVENT DIARRHEA.    YOUR BIOPSY RESULTS WILL BE AVAILABLE IN MY CHART AFTER NOV 16 AND MY OFFICE WILL CONTACT YOU IN 10-14 DAYS WITH YOUR RESULTS.   FOLLOW UP IN 3 MOS.   Next colonoscopy AT AGE 89.  Colonoscopy Care After Read the instructions outlined below and refer to this sheet in the next week. These discharge instructions provide you with general information on caring for yourself after you leave the hospital. While your treatment has been planned according to the most current medical practices available, unavoidable complications occasionally occur. If you have any problems or questions after discharge, call DR. Antron Seth, [phone].  ACTIVITY  You may resume your regular activity, but move at a slower pace for the next 24 hours.   Take frequent rest periods for the next 24 hours.   Walking will help get rid of the air and reduce the bloated feeling in your belly (abdomen).   No driving for 24 hours (because of the medicine (anesthesia) used during the test).   You may shower.   Do not sign any important legal documents or operate any machinery for 24  hours (because of the anesthesia used during the test).    NUTRITION  Drink plenty of fluids.   You may resume your normal diet as instructed by your doctor.   Begin with a light meal and progress to your normal diet. Heavy or fried foods are harder to digest and may make you feel sick to your stomach (nauseated).   Avoid alcoholic beverages for 24 hours or as instructed.    MEDICATIONS  You may resume your normal medications.   WHAT YOU CAN EXPECT TODAY  Some feelings of bloating in the abdomen.   Passage of more gas than usual.   Spotting of blood in your stool or on the toilet paper  .  IF YOU HAD POLYPS REMOVED DURING THE COLONOSCOPY:  Eat a soft diet IF YOU HAVE NAUSEA, BLOATING, ABDOMINAL PAIN, OR VOMITING.    FINDING OUT THE RESULTS OF YOUR TEST Not all test results are available during your visit. DR. Oneida Alar WILL CALL YOU WITHIN 14 DAYS OF YOUR PROCEDUE WITH YOUR RESULTS. Do not assume everything is normal if you have not heard from DR. Niveah Boerner, CALL HER OFFICE AT [phone].  SEEK IMMEDIATE MEDICAL ATTENTION AND CALL THE OFFICE: [phone] IF:  You have more than a spotting of blood in your stool.   Your belly is swollen (abdominal distention).   You are nauseated or vomiting.   You have a temperature over 101F.   You have abdominal pain  or discomfort that is severe or gets worse throughout the day.  High-Fiber Diet A high-fiber diet changes your normal diet to include more whole grains, legumes, fruits, and vegetables. Changes in the diet involve replacing refined carbohydrates with unrefined foods. The calorie level of the diet is essentially unchanged. The Dietary Reference Intake (recommended amount) for adult males is 38 grams per day. For adult females, it is 25 grams per day. Pregnant and lactating women should consume 28 grams of fiber per day. Fiber is the intact part of a plant that is not broken down during digestion. Functional fiber is fiber  that has been isolated from the plant to provide a beneficial effect in the body.  PURPOSE  Increase stool bulk.   Ease and regulate bowel movements.   Lower cholesterol.   REDUCE RISK OF COLON CANCER  INDICATIONS THAT YOU NEED MORE FIBER  Constipation and hemorrhoids.   Uncomplicated diverticulosis (intestine condition) and irritable bowel syndrome.   Weight management.   As a protective measure against hardening of the arteries (atherosclerosis), diabetes, and cancer.   GUIDELINES FOR INCREASING FIBER IN THE DIET  Start adding fiber to the diet slowly. A gradual increase of about 5 more grams (2 slices of whole-wheat bread, 2 servings of most fruits or vegetables, or 1 bowl of high-fiber cereal) per day is best. Too rapid an increase in fiber may result in constipation, flatulence, and bloating.   Drink enough water and fluids to keep your urine clear or pale yellow. Water, juice, or caffeine-free drinks are recommended. Not drinking enough fluid may cause constipation.   Eat a variety of high-fiber foods rather than one type of fiber.   Try to increase your intake of fiber through using high-fiber foods rather than fiber pills or supplements that contain small amounts of fiber.   The goal is to change the types of food eaten. Do not supplement your present diet with high-fiber foods, but replace foods in your present diet.   INCLUDE A VARIETY OF FIBER SOURCES  Replace refined and processed grains with whole grains, canned fruits with fresh fruits, and incorporate other fiber sources. White rice, white breads, and most bakery goods contain little or no fiber.   Brown whole-grain rice, buckwheat oats, and many fruits and vegetables are all good sources of fiber. These include: broccoli, Brussels sprouts, cabbage, cauliflower, beets, sweet potatoes, white potatoes (skin on), carrots, tomatoes, eggplant, squash, berries, fresh fruits, and dried fruits.   Cereals appear to be  the richest source of fiber. Cereal fiber is found in whole grains and bran. Bran is the fiber-rich outer coat of cereal grain, which is largely removed in refining. In whole-grain cereals, the bran remains. In breakfast cereals, the largest amount of fiber is found in those with "bran" in their names. The fiber content is sometimes indicated on the label.   You may need to include additional fruits and vegetables each day.   In baking, for 1 cup white flour, you may use the following substitutions:   1 cup whole-wheat flour minus 2 tablespoons.   1/2 cup white flour plus 1/2 cup whole-wheat flour.   Low-Fat Diet BREADS, CEREALS, PASTA, RICE, DRIED PEAS, AND BEANS These products are high in carbohydrates and most are low in fat. Therefore, they can be increased in the diet as substitutes for fatty foods. They too, however, contain calories and should not be eaten in excess. Cereals can be eaten for snacks as well as for breakfast.  FRUITS AND VEGETABLES It is good to eat fruits and vegetables. Besides being sources of fiber, both are rich in vitamins and some minerals. They help you get the daily allowances of these nutrients. Fruits and vegetables can be used for snacks and desserts.  MEATS Limit lean meat, chicken, [place], and fish to no more than 6 ounces per day. Beef, Pork, and Lamb Use lean cuts of beef, pork, and lamb. Lean cuts include:  Extra-lean ground beef.  Arm roast.  Sirloin tip.  Center-cut ham.  Round steak.  Loin chops.  Rump roast.  Tenderloin.  Trim all fat off the outside of meats before cooking. It is not necessary to severely decrease the intake of red meat, but lean choices should be made. Lean meat is rich in protein and contains a highly absorbable form of iron. Premenopausal women, in particular, should avoid reducing lean red meat because this could increase the risk for low red blood cells (iron-deficiency anemia).  Chicken and [place] These are good  sources of protein. The fat of poultry can be reduced by removing the skin and underlying fat layers before cooking. Chicken and [place] can be substituted for lean red meat in the diet. Poultry should not be fried or covered with high-fat sauces. Fish and Shellfish Fish is a good source of protein. Shellfish contain cholesterol, but they usually are low in saturated fatty acids. The preparation of fish is important. Like chicken and [place], they should not be fried or covered with high-fat sauces. EGGS Egg whites contain no fat or cholesterol. They can be eaten often. Try 1 to 2 egg whites instead of whole eggs in recipes or use egg substitutes that do not contain yolk. MILK AND DAIRY PRODUCTS Use skim or 1% milk instead of 2% or whole milk. Decrease whole milk, natural, and processed cheeses. Use nonfat or low-fat (2%) cottage cheese or low-fat cheeses made from vegetable oils. Choose nonfat or low-fat (1 to 2%) yogurt. Experiment with evaporated skim milk in recipes that call for heavy cream. Substitute low-fat yogurt or low-fat cottage cheese for sour cream in dips and salad dressings. Have at least 2 servings of low-fat dairy products, such as 2 glasses of skim (or 1%) milk each day to help get your daily calcium intake. FATS AND OILS Reduce the total intake of fats, especially saturated fat. Butterfat, lard, and beef fats are high in saturated fat and cholesterol. These should be avoided as much as possible. Vegetable fats do not contain cholesterol, but certain vegetable fats, such as coconut oil, palm oil, and palm kernel oil are very high in saturated fats. These should be limited. These fats are often used in bakery goods, processed foods, popcorn, oils, and nondairy creamers. Vegetable shortenings and some peanut butters contain hydrogenated oils, which are also saturated fats. Read the labels on these foods and check for saturated vegetable oils. Unsaturated vegetable oils and fats do not raise  blood cholesterol. However, they should be limited because they are fats and are high in calories. Total fat should still be limited to 30% of your daily caloric intake. Desirable liquid vegetable oils are corn oil, cottonseed oil, olive oil, canola oil, safflower oil, soybean oil, and sunflower oil. Peanut oil is not as good, but small amounts are acceptable. Buy a heart-healthy tub margarine that has no partially hydrogenated oils in the ingredients. Mayonnaise and salad dressings often are made from unsaturated fats, but they should also be limited because of their high calorie and fat  content. Seeds, nuts, peanut butter, olives, and avocados are high in fat, but the fat is mainly the unsaturated type. These foods should be limited mainly to avoid excess calories and fat. OTHER EATING TIPS Snacks  Most sweets should be limited as snacks. They tend to be rich in calories and fats, and their caloric content outweighs their nutritional value. Some good choices in snacks are graham crackers, melba toast, soda crackers, bagels (no egg), English muffins, fruits, and vegetables. These snacks are preferable to snack crackers, [place] fries, TORTILLA CHIPS, and POTATO chips. Popcorn should be air-popped or cooked in small amounts of liquid vegetable oil. Desserts Eat fruit, low-fat yogurt, and fruit ices instead of pastries, cake, and cookies. Sherbet, angel food cake, gelatin dessert, frozen low-fat yogurt, or other frozen products that do not contain saturated fat (pure fruit juice bars, frozen ice pops) are also acceptable.  COOKING METHODS Choose those methods that use little or no fat. They include: Poaching.  Braising.  Steaming.  Grilling.  Baking.  Stir-frying.  Broiling.  Microwaving.  Foods can be cooked in a nonstick pan without added fat, or use a nonfat cooking spray in regular cookware. Limit fried foods and avoid frying in saturated fat. Add moisture to lean meats by using water, broth,  cooking wines, and other nonfat or low-fat sauces along with the cooking methods mentioned above. Soups and stews should be chilled after cooking. The fat that forms on top after a few hours in the refrigerator should be skimmed off. When preparing meals, avoid using excess salt. Salt can contribute to raising blood pressure in some people.  EATING AWAY FROM HOME Order entres, potatoes, and vegetables without sauces or butter. When meat exceeds the size of a deck of cards (3 to 4 ounces), the rest can be taken home for another meal. Choose vegetable or fruit salads and ask for low-calorie salad dressings to be served on the side. Use dressings sparingly. Limit high-fat toppings, such as bacon, crumbled eggs, cheese, sunflower seeds, and olives. Ask for heart-healthy tub margarine instead of butter.    Hemorrhoids Hemorrhoids are dilated (enlarged) veins around the rectum. Sometimes clots will form in the veins. This makes them swollen and painful. These are called thrombosed hemorrhoids. Causes of hemorrhoids include:  Constipation.   Straining to have a bowel movement.   HEAVY LIFTING  HOME CARE INSTRUCTIONS  Eat a well balanced diet and drink 6 to 8 glasses of water every day to avoid constipation. You may also use a bulk laxative.   Avoid straining to have bowel movements.   Keep anal area dry and clean.   Do not use a donut shaped pillow or sit on the toilet for long periods. This increases blood pooling and pain.   Move your bowels when your body has the urge; this will require less straining and will decrease pain and pressure.

## 2017-06-22 NOTE — H&P (Signed)
Primary Care Physician:  Deloria Lair., MD Primary Gastroenterologist:  Dr. Oneida Alar  Pre-Procedure History & Physical: HPI:  Alexandra Jones is a 21 y.o. female here for Diarrhea/BRBPR.  Past Medical History:  Diagnosis Date  . GERD (gastroesophageal reflux disease)     Past Surgical History:  Procedure Laterality Date  . CHOLECYSTECTOMY    . wisdom teeth extraction      Prior to Admission medications   Medication Sig Start Date End Date Taking? Authorizing Provider  ibuprofen (ADVIL,MOTRIN) 200 MG tablet Take 400 mg daily as needed by mouth for headache or moderate pain.   Yes [provider]  loratadine (CLARITIN) 10 MG tablet Take 10 mg daily as needed by mouth for allergies.   Yes [provider]  Multiple Vitamin (MULTIVITAMIN WITH MINERALS) TABS tablet Take 1 tablet daily by mouth.   Yes [provider]  polyethylene glycol-electrolytes (TRILYTE) 420 g solution Take 4,000 mLs by mouth as directed. 05/19/17  Yes Sarabella Caprio L, MD  Probiotic CAPS Take 1 capsule daily by mouth.   Yes [provider]  ranitidine (ZANTAC) 150 MG tablet Take 150 mg daily as needed by mouth for heartburn.    [provider]    Allergies as of 05/19/2017 - Review Complete 05/01/2017  Allergen Reaction Noted  . Penicillins Rash 08/10/2014    Family History  Problem Relation Age of Onset  . Hypertension Maternal Grandmother   . Cancer Maternal Grandmother        breast  . Cancer Maternal Grandfather        prostate  . Colon cancer Maternal Grandfather        over 8  . Celiac disease Neg Hx   . Crohn's disease Neg Hx   . Ulcerative colitis Neg Hx     Social History   Socioeconomic History  . Marital status: Single    Spouse name: Not on file  . Number of children: Not on file  . Years of education: Not on file  . Highest education level: Not on file  Social Needs  . Financial resource strain: Not on file  . Food insecurity - worry:  Not on file  . Food insecurity - inability: Not on file  . Transportation needs - medical: Not on file  . Transportation needs - non-medical: Not on file  Occupational History  . Not on file  Tobacco Use  . Smoking status: Never Smoker  . Smokeless tobacco: Never Used  Substance and Sexual Activity  . Alcohol use: Yes    Alcohol/week: 0.0 oz    Comment: occasional  . Drug use: No  . Sexual activity: Yes    Birth control/protection: IUD  Other Topics Concern  . Not on file  Social History Narrative  . Not on file    Review of Systems: See HPI, otherwise negative ROS   Physical Exam: BP 122/69   Pulse 91   Temp 99.2 F (37.3 C) (Oral)   Resp 19   SpO2 100%  General:   Alert,  pleasant and cooperative in NAD Head:  Normocephalic and atraumatic. Neck:  Supple; Lungs:  Clear throughout to auscultation.    Heart:  Regular rate and rhythm. Abdomen:  Soft, nontender and nondistended. Normal bowel sounds, without guarding, and without rebound.   Neurologic:  Alert and  oriented x4;  grossly normal neurologically.  Impression/Plan:     Diarrhea/BRBPR  PLAN: TCS TODAY WITH BIOPSY DISCUSSED PROCEDURE, BENEFITS, & RISKS: < 1% chance  of medication reaction, bleeding, perforation, or rupture of spleen/liver.

## 2017-06-23 LAB — TISSUE TRANSGLUTAMINASE, IGA: Tissue Transglutaminase Ab, IgA: <2 U/mL (ref 0–3)

## 2017-06-23 NOTE — Progress Notes (Signed)
PT is aware.

## 2017-06-23 NOTE — Progress Notes (Signed)
PATIENT SCHEDULED  °

## 2017-06-25 ENCOUNTER — Encounter (HOSPITAL_COMMUNITY): Payer: Self-pay | Admitting: Gastroenterology

## 2017-09-22 ENCOUNTER — Ambulatory Visit: Payer: Commercial Managed Care - PPO | Admitting: Gastroenterology

## 2017-12-17 DIAGNOSIS — E669 Obesity, unspecified: Secondary | ICD-10-CM | POA: Insufficient documentation

## 2018-01-13 ENCOUNTER — Ambulatory Visit (INDEPENDENT_AMBULATORY_CARE_PROVIDER_SITE_OTHER): Payer: Commercial Managed Care - PPO | Admitting: Advanced Practice Midwife

## 2018-01-13 ENCOUNTER — Other Ambulatory Visit (HOSPITAL_COMMUNITY)
Admission: RE | Admit: 2018-01-13 | Discharge: 2018-01-13 | Disposition: A | Discharge status: Home / Self Care | Payer: Commercial Managed Care - PPO | Source: Ambulatory Visit | Attending: Advanced Practice Midwife | Admitting: Advanced Practice Midwife

## 2018-01-13 ENCOUNTER — Encounter: Payer: Self-pay | Admitting: Advanced Practice Midwife

## 2018-01-13 ENCOUNTER — Other Ambulatory Visit: Payer: Self-pay

## 2018-01-13 VITALS — BP 130/88 | HR 86 | Ht 66.0 in | Wt 236.0 lb

## 2018-01-13 DIAGNOSIS — Z01419 Encounter for gynecological examination (general) (routine) without abnormal findings: Secondary | ICD-10-CM | POA: Insufficient documentation

## 2018-01-13 DIAGNOSIS — Z975 Presence of (intrauterine) contraceptive device: Secondary | ICD-10-CM | POA: Insufficient documentation

## 2018-01-13 NOTE — Progress Notes (Signed)
Alexandra Jones Expose 22 y.o.  Vitals:   01/13/18 1558  BP: 130/88  Pulse: 86     Filed Weights   01/13/18 1558  Weight: 236 lb (107 kg)    Past Medical History: Past Medical History:  Diagnosis Date  . GERD (gastroesophageal reflux disease)     Past Surgical History: Past Surgical History:  Procedure Laterality Date  . CHOLECYSTECTOMY    . COLONOSCOPY N/A 06/22/2017   Procedure: COLONOSCOPY;  Surgeon: Danie Binder, MD;  Location: [facility];  Service: Endoscopy;  Laterality: N/A;  1:45pm  . wisdom teeth extraction      Family History: Family History  Problem Relation Age of Onset  . Hypertension Maternal Grandmother   . Cancer Maternal Grandmother        breast  . Cancer Maternal Grandfather        prostate  . Colon cancer Maternal Grandfather        over 53  . Celiac disease Neg Hx   . Crohn's disease Neg Hx   . Ulcerative colitis Neg Hx     Social History: Social History   Tobacco Use  . Smoking status: Never Smoker  . Smokeless tobacco: Never Used  Substance Use Topics  . Alcohol use: Yes    Alcohol/week: 0.0 oz    Comment: occasional  . Drug use: No    Allergies:  Allergies  Allergen Reactions  . Hydrocodone Hives  . Penicillins Rash    fever Has patient had a PCN reaction causing immediate rash, facial/tongue/throat swelling, SOB or lightheadedness with hypotension: Yes Has patient had a PCN reaction causing severe rash involving mucus membranes or skin necrosis: No Has patient had a PCN reaction that required hospitalization: No Has patient had a PCN reaction occurring within the last 10 years: Yes If all of the above answers are "NO", then may proceed with Cephalosporin use.       Current Outpatient Medications:  .  escitalopram (LEXAPRO) 10 MG tablet, Take 5 mg by mouth daily., Disp: , Rfl: 2 .  ibuprofen (ADVIL,MOTRIN) 200 MG tablet, Take 400 mg daily as needed by mouth for headache or moderate pain., Disp: , Rfl:  .  Levonorgestrel  (LILETTA, 52 MG,) 19.5 MCG/DAY IUD IUD, 1 each by Intrauterine route once., Disp: , Rfl:  .  loratadine (CLARITIN) 10 MG tablet, Take 10 mg daily as needed by mouth for allergies., Disp: , Rfl:  .  Multiple Vitamin (MULTI-VITAMINS) TABS, Take by mouth., Disp: , Rfl:  .  Probiotic CAPS, Take 1 capsule daily by mouth., Disp: , Rfl:   History of Present Illness: here for pap and physical.  Has had [place] IUD since 12/15; IUD strings not palpable so had [place] which revealed properly placed IUD.  rarely bleeds.  Has seen GI MD for abdominal pain/bloating, colonoscopy/bx were normal. Still has some issues   Review of Systems   Patient denies any headaches, blurred vision, shortness of breath, chest pain, problems w/ urination, or intercourse.   Physical Exam: General:  Well developed, well nourished, no acute distress Skin:  Warm and dry Neck:  Midline trachea, normal thyroid Lungs; Clear to auscultation bilaterally Breast:  No dominant palpable mass, retraction, or nipple discharge Cardiovascular: Regular rate and rhythm Abdomen:  Soft, non tender, no hepatosplenomegaly Pelvic:  External genitalia is normal in appearance.  The vagina is normal in appearance.  The cervix is nulliparous.  IUD strings are visible.  Uterus is felt to be normal size, shape,  and contour.  No adnexal masses or tenderness noted.  Extremities:  No swelling or varicosities noted Psych:  No mood changes.     Impression: normal GYN exam  Plan: if pap normal, repeat q 3 years Has had gardsil

## 2018-01-15 LAB — CYTOLOGY - PAP
Chlamydia: NEGATIVE
Diagnosis: NEGATIVE
Neisseria Gonorrhea: NEGATIVE

## 2018-01-20 ENCOUNTER — Encounter (INDEPENDENT_AMBULATORY_CARE_PROVIDER_SITE_OTHER): Payer: Self-pay

## 2018-01-20 ENCOUNTER — Other Ambulatory Visit: Payer: Self-pay | Admitting: Advanced Practice Midwife

## 2018-01-20 MED ORDER — FLUCONAZOLE 150 MG PO TABS: 150.0000 mg | ORAL_TABLET | Freq: Once | ORAL | 0 refills | Status: AC

## 2018-02-16 DIAGNOSIS — F419 Anxiety disorder, unspecified: Secondary | ICD-10-CM | POA: Diagnosis not present

## 2018-02-16 DIAGNOSIS — R519 Headache, unspecified: Secondary | ICD-10-CM | POA: Insufficient documentation

## 2018-02-16 DIAGNOSIS — R51 Headache: Secondary | ICD-10-CM | POA: Diagnosis not present

## 2018-02-16 DIAGNOSIS — R140 Abdominal distension (gaseous): Secondary | ICD-10-CM | POA: Diagnosis not present

## 2018-03-02 DIAGNOSIS — Z0100 Encounter for examination of eyes and vision without abnormal findings: Secondary | ICD-10-CM | POA: Diagnosis not present

## 2018-03-08 DIAGNOSIS — Z23 Encounter for immunization: Secondary | ICD-10-CM | POA: Diagnosis not present

## 2018-03-08 DIAGNOSIS — Z111 Encounter for screening for respiratory tuberculosis: Secondary | ICD-10-CM | POA: Diagnosis not present

## 2018-03-24 DIAGNOSIS — Z111 Encounter for screening for respiratory tuberculosis: Secondary | ICD-10-CM | POA: Diagnosis not present

## 2018-03-29 ENCOUNTER — Ambulatory Visit (HOSPITAL_COMMUNITY)
Admission: RE | Admit: 2018-03-29 | Discharge: 2018-03-29 | Disposition: A | Discharge status: Home / Self Care | Payer: 59 | Source: Ambulatory Visit | Attending: Family Medicine | Admitting: Family Medicine

## 2018-03-29 ENCOUNTER — Other Ambulatory Visit (HOSPITAL_COMMUNITY): Payer: Self-pay | Admitting: Family Medicine

## 2018-03-29 DIAGNOSIS — S6991XD Unspecified injury of right wrist, hand and finger(s), subsequent encounter: Secondary | ICD-10-CM | POA: Diagnosis not present

## 2018-03-29 DIAGNOSIS — S6701XA Crushing injury of right thumb, initial encounter: Secondary | ICD-10-CM | POA: Diagnosis not present

## 2018-03-29 DIAGNOSIS — S6991XA Unspecified injury of right wrist, hand and finger(s), initial encounter: Secondary | ICD-10-CM | POA: Diagnosis not present

## 2018-04-09 ENCOUNTER — Telehealth: Payer: 59 | Admitting: Nurse Practitioner

## 2018-04-09 DIAGNOSIS — H60339 Swimmer's ear, unspecified ear: Secondary | ICD-10-CM

## 2018-04-09 MED ORDER — CIPROFLOXACIN-DEXAMETHASONE 0.3-0.1 % OT SUSP: 4.0000 [drp] | Freq: Two times a day (BID) | OTIC | 0 refills | Status: DC

## 2018-04-09 MED ORDER — CIPROFLOXACIN HCL 500 MG PO TABS: 500.0000 mg | ORAL_TABLET | Freq: Two times a day (BID) | ORAL | 0 refills | Status: DC

## 2018-04-09 NOTE — Progress Notes (Deleted)
I also sent in cipro 500mg  1 po bid for 5 days

## 2018-04-09 NOTE — Progress Notes (Addendum)
E Visit for Swimmer's Ear  We are sorry that you are not feeling well. Here is how we plan to help!  Based on what you have shared with me it looks like you have swimmers ear. Swimmer's ear is a redness or swelling, irritation, or infection of your outer ear canal.  These symptoms usually occur within a few days of swimming.  Your ear canal is a tube that goes from the opening of the ear to the eardrum.  When water stays in your ear canal, germs can grow.  This is a painful condition that often happens to children and swimmers of all ages.  It is not contagious and oral antibiotics are not required to treat uncomplicated swimmer's ear.  The usual symptoms include: Itching inside the ear, Redness or a sense of swelling in the ear, Pain when the ear is tugged on when pressure is placed on the ear, Pus draining from the infected ear. and I have prescribed: Ciprofloxin 0.2% and hydrocortisone 1% otic suspension 3 drops in affected ears twice daily until completed  Also sent in cipro 500mg  1  Po bid for 5 days.   In certain cases swimmer's ear may progress to a more serious bacterial infection of the middle or inner ear.  If you have a fever 102 and up and significantly worsening symptoms, this could indicate a more serious infection moving to the middle/inner and needs face to face evaluation in an office by a provider.  Your symptoms should improve over the next 3 days and should resolve in about 7 days.  HOME CARE:   Wash your hands frequently.  Do not place the tip of the bottle on your ear or touch it with your fingers.  You can take Acetominophen 650 mg every 4-6 hours as needed for pain.  If pain is severe or moderate, you can apply a heating pad (set on low) or hot water bottle (wrapped in a towel) to outer ear for 20 minutes.  This will also increase drainage.  Avoid ear plugs  Do not use Q-tips  After showers, help the water run out by tilting your head to one side.  GET HELP RIGHT  AWAY IF:   Fever is over 102.2 degrees.  You develop progressive ear pain or hearing loss.  Ear symptoms persist longer than 3 days after treatment.  MAKE SURE YOU:   Understand these instructions.  Will watch your condition.  Will get help right away if you are not doing well or get worse.  TO PREVENT SWIMMER'S EAR:  Use a bathing cap or custom fitted swim molds to keep your ears dry.  Towel off after swimming to dry your ears.  Tilt your head or pull your earlobes to allow the water to escape your ear canal.  If there is still water in your ears, consider using a hairdryer on the lowest setting.  Thank you for choosing an e-visit. Your e-visit answers were reviewed by a board certified advanced clinical practitioner to complete your personal care plan. Depending upon the condition, your plan could have included both over the counter or prescription medications. Please review your pharmacy choice. Be sure that the pharmacy you have chosen is open so that you can pick up your prescription now.  If there is a problem you may message your provider in [place] to have the prescription routed to another pharmacy. Your safety is important to [place]. If you have drug allergies check your prescription carefully.  For the  next 24 hours, you can use MyChart to ask questions about today's visit, request a non-urgent call back, or ask for a work or school excuse from your e-visit provider. You will get an email in the next two days asking about your experience. I hope that your e-visit has been valuable and will speed your recovery.       

## 2018-04-09 NOTE — Addendum Note (Signed)
Addended by: Chevis Pretty on: 04/09/2018 08:45 PM   Modules accepted: Orders

## 2018-06-03 DIAGNOSIS — Z23 Encounter for immunization: Secondary | ICD-10-CM | POA: Diagnosis not present

## 2018-07-05 MED FILL — ESCITALOPRAM 5 MG TABLET: 5 | 30 days supply | Qty: 30 | Fill #0

## 2018-08-06 MED FILL — ESCITALOPRAM 5 MG TABLET: 5 | 90 days supply | Qty: 90 | Fill #1

## 2018-11-05 MED FILL — ESCITALOPRAM 5 MG TABLET: 5 | 30 days supply | Qty: 30 | Fill #0

## 2018-12-13 MED FILL — ESCITALOPRAM 5 MG TABLET: 5 | 90 days supply | Qty: 90 | Fill #0

## 2019-02-11 ENCOUNTER — Telehealth: Payer: 59 | Admitting: Family

## 2019-02-11 DIAGNOSIS — H9201 Otalgia, right ear: Secondary | ICD-10-CM

## 2019-02-11 NOTE — Progress Notes (Signed)
With the foul odor and hearing loss, this requires a physical exam.   Based on what you shared with me, I feel your condition warrants further evaluation and I recommend that you be seen for a face to face office visit.  NOTE: If you entered your credit card information for this eVisit, you will not be charged. You may see a "hold" on your card for the $35 but that hold will drop off and you will not have a charge processed.  If you are having a true medical emergency please call 911.     For an urgent face to face visit, [facility] has five urgent care centers for your convenience:    [email or web address] to reserve your spot online an avoid wait times  [facility] [address] [place], [place] [address] Modified hours of operation: Monday-Friday, 12 PM to 6 PM  Closed Saturday & Sunday  *Across the street from [place] (New Address!) [address] [place], [place] [address] *Just off [address], across the road from [place] hours of operation: Monday-Friday, 12 PM to 6 PM  Closed Saturday & Sunday   The following sites will take your insurance:  . [facility]    [phone]                  Get Driving Directions  [address], [place] [address] . 10 am to 8 pm Monday-Friday . 12 pm to 8 pm Saturday-Sunday   . [facility] at [place]                  Get Driving Directions  [address], [place], [place] [address] . 8 am to 8 pm Monday-Friday . 9 am to 6 pm Saturday . 11 am to 6 pm Sunday   . [facility] at [place]                  Get Driving Directions   [address] [place], [place] [address] . 8 am to 8 pm Monday-Friday . 8 am to 4 pm Saturday-Sunday    . [facility] [place]  [address], [place], [place] [address]  . Monday-Friday, 12 PM to 6 PM    Your e-visit answers were reviewed by a board certified advanced clinical practitioner to complete your personal care plan.  Thank you for using e-Visits.

## 2019-02-23 ENCOUNTER — Telehealth: Payer: 59 | Admitting: Family

## 2019-02-23 DIAGNOSIS — H60501 Unspecified acute noninfective otitis externa, right ear: Secondary | ICD-10-CM | POA: Diagnosis not present

## 2019-02-23 MED ORDER — CIPROFLOXACIN-DEXAMETHASONE 0.3-0.1 % OT SUSP: 4.0000 [drp] | Freq: Two times a day (BID) | OTIC | 0 refills | Status: DC

## 2019-02-23 NOTE — Progress Notes (Signed)
E Visit for Swimmer's Ear  We are sorry that you are not feeling well. Here is how we plan to help!  Based on what you have shared with me it looks like you have swimmers ear. Swimmer's ear is a redness or swelling, irritation, or infection of your outer ear canal.  These symptoms usually occur within a few days of swimming.  Your ear canal is a tube that goes from the opening of the ear to the eardrum.  When water stays in your ear canal, germs can grow.  This is a painful condition that often happens to children and swimmers of all ages.  It is not contagious and oral antibiotics are not required to treat uncomplicated swimmer's ear.  The usual symptoms include: Itching inside the ear, Redness or a sense of swelling in the ear, Pain when the ear is tugged on when pressure is placed on the ear, Pus draining from the infected ear. and I have prescribed: Ciprofloxin 0.2% and hydrocortisone 1% otic suspension 3 drops in affected ears twice daily for 7 days  Approximately 5 minutes was spent documenting and reviewing patient's chart.    In certain cases swimmer's ear may progress to a more serious bacterial infection of the middle or inner ear.  If you have a fever 102 and up and significantly worsening symptoms, this could indicate a more serious infection moving to the middle/inner and needs face to face evaluation in an office by a provider.  Your symptoms should improve over the next 3 days and should resolve in about 7 days.  HOME CARE:   Wash your hands frequently.  Do not place the tip of the bottle on your ear or touch it with your fingers.  You can take Acetominophen 650 mg every 4-6 hours as needed for pain.  If pain is severe or moderate, you can apply a heating pad (set on low) or hot water bottle (wrapped in a towel) to outer ear for 20 minutes.  This will also increase drainage.  Avoid ear plugs  Do not use Q-tips  After showers, help the water run out by tilting your head to one  side.  GET HELP RIGHT AWAY IF:   Fever is over 102.2 degrees.  You develop progressive ear pain or hearing loss.  Ear symptoms persist longer than 3 days after treatment.  MAKE SURE YOU:   Understand these instructions.  Will watch your condition.  Will get help right away if you are not doing well or get worse.  TO PREVENT SWIMMER'S EAR:  Use a bathing cap or custom fitted swim molds to keep your ears dry.  Towel off after swimming to dry your ears.  Tilt your head or pull your earlobes to allow the water to escape your ear canal.  If there is still water in your ears, consider using a hairdryer on the lowest setting.  Thank you for choosing an e-visit. Your e-visit answers were reviewed by a board certified advanced clinical practitioner to complete your personal care plan. Depending upon the condition, your plan could have included both over the counter or prescription medications. Please review your pharmacy choice. Be sure that the pharmacy you have chosen is open so that you can pick up your prescription now.  If there is a problem you may message your provider in [place] to have the prescription routed to another pharmacy. Your safety is important to [place]. If you have drug allergies check your prescription carefully.  For the  next 24 hours, you can use MyChart to ask questions about today's visit, request a non-urgent call back, or ask for a work or school excuse from your e-visit provider. You will get an email in the next two days asking about your experience. I hope that your e-visit has been valuable and will speed your recovery.

## 2019-03-14 DIAGNOSIS — F419 Anxiety disorder, unspecified: Secondary | ICD-10-CM

## 2019-03-14 DIAGNOSIS — R1013 Epigastric pain: Secondary | ICD-10-CM | POA: Insufficient documentation

## 2019-03-14 HISTORY — DX: Anxiety disorder, unspecified: F41.9

## 2019-03-14 NOTE — Progress Notes (Signed)
New Patient Office Visit  Assessment & Plan:  1. Well adult exam - Preventive care education provided.   2. Generalized anxiety disorder - Well controlled on current regimen.  - escitalopram (LEXAPRO) 10 MG tablet; Take 0.5 tablets (5 mg total) by mouth daily.  Dispense: 45 tablet; Refill: 3 - CMP14+EGFR; Future  3. Gastroesophageal reflux disease, esophagitis presence not specified - Well controlled on current regimen.  - CMP14+EGFR; Future  4. Acute otitis externa of right ear, unspecified type - Encouraged to use antibiotic ear drops for a few more days.   5. Obesity (BMI 35.0-39.9 without comorbidity) - Encouraged diet and continuance of daily exercise.  - Lipid panel; Future  6. Screening-pulmonary TB - PPD   Follow-up: Return in about 1 year (around 03/15/2020) for annual physical. and on Friday to have TB test read and fasting labs.   Hendricks Limes, MSN, APRN, FNP-C [facility]  Subjective:  Patient ID: Alexandra Jones, female    DOB: Nov 06, 1995  Age: 23 y.o. MRN: [id number]  Patient Care Team: Loman Brooklyn, FNP as PCP - General (Family Medicine) Danie Binder, MD as Consulting Physician (Gastroenterology) Jonnie Kind, MD as Consulting Physician (Obstetrics and Gynecology)  CC:  Chief Complaint  Patient presents with  . New Patient (Initial Visit)    HPI Alexandra Jones presents to establish care. She is transferring from [place]; I am her previous PCP. Patient due for annual physical and is also in need of medication refills.   Eye exam: last year Dentist: every 6 months Diet: nothing special Exercise: for the past few weeks she has been walking or doing a beach body workout daily Pap: UTD Immunizations: UTD  Anxiety: patient states she is doing well on current dose of Lexapro.   Depression screen [facility] 2/9 03/16/2019 01/13/2018  Decreased Interest 0 0  Down, Depressed, Hopeless 0 0  PHQ - 2 Score 0 0   GAD 7  : Generalized Anxiety Score 03/16/2019  Nervous, Anxious, on Edge 1  Control/stop worrying 1  Worry too much - different things 1  Trouble relaxing 1  Restless 0  Easily annoyed or irritable 1  Afraid - awful might happen 0  Total GAD 7 Score 5  Anxiety Difficulty Not difficult at all   GERD: patient started taking OTC famotidine which is helping acid reflux.   Ear pain: patient reports she was recently treated for otitis externa. The antibiotic drops she used did help significantly but she reports she is still experiencing some pain and pressure in the right ear.   Review of Systems  Constitutional: Negative for chills, fever, malaise/fatigue and weight loss.  HENT: Positive for ear pain (right). Negative for congestion, ear discharge, nosebleeds, sinus pain, sore throat and tinnitus.   Eyes: Negative for blurred vision, double vision, pain, discharge and redness.  Respiratory: Negative for cough, shortness of breath and wheezing.   Cardiovascular: Negative for chest pain, palpitations and leg swelling.  Gastrointestinal: Positive for diarrhea. Negative for abdominal pain, constipation, heartburn, nausea and vomiting.  Genitourinary: Negative for dysuria, frequency and urgency.  Musculoskeletal: Negative for myalgias.  Skin: Negative for rash.  Neurological: Negative for dizziness, seizures, weakness and headaches.  Psychiatric/Behavioral: Negative for depression, substance abuse and suicidal ideas. The patient is nervous/anxious.     Current Outpatient Medications:  .  escitalopram (LEXAPRO) 10 MG tablet, Take 0.5 tablets (5 mg total) by mouth daily., Disp: 45 tablet, Rfl: 3 .  famotidine (  PEPCID) 20 MG tablet, Take 20 mg by mouth 2 (two) times daily., Disp: , Rfl:  .  ibuprofen (ADVIL,MOTRIN) 200 MG tablet, Take 400 mg daily as needed by mouth for headache or moderate pain., Disp: , Rfl:  .  Levonorgestrel (LILETTA, 52 MG,) 19.5 MCG/DAY IUD IUD, 1 each by Intrauterine route once.,  Disp: , Rfl:  .  loratadine (CLARITIN) 10 MG tablet, Take 10 mg daily as needed by mouth for allergies., Disp: , Rfl:  .  Multiple Vitamin (MULTI-VITAMINS) TABS, Take by mouth., Disp: , Rfl:  .  Probiotic CAPS, Take 1 capsule daily by mouth., Disp: , Rfl:   Allergies  Allergen Reactions  . Hydrocodone Hives  . Penicillins Rash    fever Has patient had a PCN reaction causing immediate rash, facial/tongue/throat swelling, SOB or lightheadedness with hypotension: Yes Has patient had a PCN reaction causing severe rash involving mucus membranes or skin necrosis: No Has patient had a PCN reaction that required hospitalization: No Has patient had a PCN reaction occurring within the last 10 years: Yes If all of the above answers are "NO", then may proceed with Cephalosporin use.     Past Medical History:  Diagnosis Date  . Anxiety disorder 03/14/2019   Last Assessment & Plan:  Controlled with Lexapro 5 mg PO QD.  Marland Kitchen Diarrhea 05/01/2017  . GERD (gastroesophageal reflux disease)     Past Surgical History:  Procedure Laterality Date  . CHOLECYSTECTOMY    . COLONOSCOPY N/A 06/22/2017   Procedure: COLONOSCOPY;  Surgeon: Danie Binder, MD;  Location: [facility];  Service: Endoscopy;  Laterality: N/A;  1:45pm  . wisdom teeth extraction      Family History  Problem Relation Age of Onset  . Prostate cancer Father   . Hypertension Maternal Grandmother   . Breast cancer Maternal Grandmother 80  . Kidney failure Maternal Grandmother   . Colon cancer Maternal Grandfather 41  . Heart failure Maternal Grandfather   . Prostate cancer Maternal Grandfather   . Celiac disease Neg Hx   . Crohn's disease Neg Hx   . Ulcerative colitis Neg Hx     Social History   Socioeconomic History  . Marital status: Single    Spouse name: Not on file  . Number of children: Not on file  . Years of education: Not on file  . Highest education level: Not on file  Occupational History  . Not on file   Social Needs  . Financial resource strain: Not on file  . Food insecurity    Worry: Not on file    Inability: Not on file  . Transportation needs    Medical: Not on file    Non-medical: Not on file  Tobacco Use  . Smoking status: Never Smoker  . Smokeless tobacco: Never Used  Substance and Sexual Activity  . Alcohol use: Yes    Alcohol/week: 0.0 standard drinks    Comment: occasional  . Drug use: No  . Sexual activity: Yes    Birth control/protection: I.U.D.  Lifestyle  . Physical activity    Days per week: Not on file    Minutes per session: Not on file  . Stress: Not on file  Relationships  . Social Herbalist on phone: Not on file    Gets together: Not on file    Attends religious service: Not on file    Active member of club or organization: Not on file    Attends meetings  of clubs or organizations: Not on file    Relationship status: Not on file  . Intimate partner violence    Fear of current or ex partner: Not on file    Emotionally abused: Not on file    Physically abused: Not on file    Forced sexual activity: Not on file  Other Topics Concern  . Not on file  Social History Narrative  . Not on file    Objective:   Today's Vitals: BP 118/85   Pulse (!) 59   Temp (!) 97.1 F (36.2 C) (Oral)   Ht '5\' 6"'  (1.676 m)   Wt 248 lb 12.8 oz (112.9 kg)   BMI 40.16 kg/m   Physical Exam Vitals signs reviewed.  Constitutional:      General: She is not in acute distress.    Appearance: Normal appearance. She is obese. She is not ill-appearing, toxic-appearing or diaphoretic.  HENT:     Head: Normocephalic and atraumatic.     Right Ear: Tympanic membrane and external ear normal. There is no impacted cerumen.     Left Ear: Tympanic membrane, ear canal and external ear normal. There is no impacted cerumen.     Ears:     Comments: Right ear canal with erythema and irritation.     Nose: Nose normal. No congestion or rhinorrhea.     Mouth/Throat:      Mouth: Mucous membranes are moist.     Pharynx: Oropharynx is clear. No oropharyngeal exudate or posterior oropharyngeal erythema.  Eyes:     General: No scleral icterus.       Right eye: No discharge.        Left eye: No discharge.     Conjunctiva/sclera: Conjunctivae normal.     Pupils: Pupils are equal, round, and reactive to light.  Neck:     Musculoskeletal: Normal range of motion and neck supple. No neck rigidity or muscular tenderness.  Cardiovascular:     Rate and Rhythm: Normal rate and regular rhythm.     Heart sounds: Normal heart sounds. No murmur. No friction rub. No gallop.   Pulmonary:     Effort: Pulmonary effort is normal. No respiratory distress.     Breath sounds: Normal breath sounds. No stridor. No wheezing, rhonchi or rales.  Abdominal:     General: Abdomen is flat. Bowel sounds are normal. There is no distension.     Palpations: Abdomen is soft. There is no mass.     Tenderness: There is no abdominal tenderness. There is no guarding or rebound.     Hernia: No hernia is present.  Musculoskeletal: Normal range of motion.  Lymphadenopathy:     Cervical: No cervical adenopathy.  Skin:    General: Skin is warm and dry.     Capillary Refill: Capillary refill takes less than 2 seconds.  Neurological:     General: No focal deficit present.     Mental Status: She is alert and oriented to person, place, and time. Mental status is at baseline.  Psychiatric:        Mood and Affect: Mood normal.        Behavior: Behavior normal.        Thought Content: Thought content normal.        Judgment: Judgment normal.

## 2019-03-15 ENCOUNTER — Other Ambulatory Visit: Payer: Self-pay

## 2019-03-16 ENCOUNTER — Ambulatory Visit (INDEPENDENT_AMBULATORY_CARE_PROVIDER_SITE_OTHER): Payer: 59 | Admitting: Family Medicine

## 2019-03-16 ENCOUNTER — Encounter: Payer: Self-pay | Admitting: Family Medicine

## 2019-03-16 VITALS — BP 118/85 | HR 59 | Temp 97.1°F | Ht 66.0 in | Wt 248.8 lb

## 2019-03-16 DIAGNOSIS — E669 Obesity, unspecified: Secondary | ICD-10-CM

## 2019-03-16 DIAGNOSIS — K219 Gastro-esophageal reflux disease without esophagitis: Secondary | ICD-10-CM

## 2019-03-16 DIAGNOSIS — F411 Generalized anxiety disorder: Secondary | ICD-10-CM

## 2019-03-16 DIAGNOSIS — Z0000 Encounter for general adult medical examination without abnormal findings: Secondary | ICD-10-CM

## 2019-03-16 DIAGNOSIS — H60501 Unspecified acute noninfective otitis externa, right ear: Secondary | ICD-10-CM

## 2019-03-16 DIAGNOSIS — Z111 Encounter for screening for respiratory tuberculosis: Secondary | ICD-10-CM

## 2019-03-16 MED ORDER — ESCITALOPRAM OXALATE 10 MG PO TABS: 5.0000 mg | ORAL_TABLET | Freq: Every day | ORAL | 3 refills | Status: DC

## 2019-03-16 MED FILL — ESCITALOPRAM 5 MG TABLET: 5 | 60 days supply | Qty: 60 | Fill #1

## 2019-03-16 NOTE — Patient Instructions (Addendum)
Return on Friday to have your TB test read and for fasting labs.   Preventive Care 25-25 Years Old, Female Preventive care refers to visits with your health care provider and lifestyle choices that can promote health and wellness. This includes:  A yearly physical exam. This may also be called an annual well check.  Regular dental visits and eye exams.  Immunizations.  Screening for certain conditions.  Healthy lifestyle choices, such as eating a healthy diet, getting regular exercise, not using drugs or products that contain nicotine and tobacco, and limiting alcohol use. What can I expect for my preventive care visit? Physical exam Your health care provider will check your:  Height and weight. This may be used to calculate body mass index (BMI), which tells if you are at a healthy weight.  Heart rate and blood pressure.  Skin for abnormal spots. Counseling Your health care provider may ask you questions about your:  Alcohol, tobacco, and drug use.  Emotional well-being.  Home and relationship well-being.  Sexual activity.  Eating habits.  Work and work Statistician.  Method of birth control.  Menstrual cycle.  Pregnancy history. What immunizations do I need?  Influenza (flu) vaccine  This is recommended every year. Tetanus, diphtheria, and pertussis (Tdap) vaccine  You may need a Td booster every 10 years. Varicella (chickenpox) vaccine  You may need this if you have not been vaccinated. Human papillomavirus (HPV) vaccine  If recommended by your health care provider, you may need three doses over 6 months. Measles, mumps, and rubella (MMR) vaccine  You may need at least one dose of MMR. You may also need a second dose. Meningococcal conjugate (MenACWY) vaccine  One dose is recommended if you are age 63-21 years and a first-year college student living in a residence hall, or if you have one of several medical conditions. You may also need additional  booster doses. Pneumococcal conjugate (PCV13) vaccine  You may need this if you have certain conditions and were not previously vaccinated. 25 Pneumococcal polysaccharide (PPSV23) vaccine  You may need one or two doses if you smoke cigarettes or if you have certain conditions. Hepatitis A vaccine  You may need this if you have certain conditions or if you travel or work in places where you may be exposed to hepatitis A. Hepatitis B vaccine  You may need this if you have certain conditions or if you travel or work in places where you may be exposed to hepatitis B. Haemophilus influenzae type b (Hib) vaccine  You may need this if you have certain conditions. You may receive vaccines as individual doses or as more than one vaccine together in one shot (combination vaccines). Talk with your health care provider about the risks and benefits of combination vaccines. What tests do I need?  Blood tests  Lipid and cholesterol levels. These may be checked every 5 years starting at age 16.  Hepatitis C test.  Hepatitis B test. Screening  Diabetes screening. This is done by checking your blood sugar (glucose) after you have not eaten for a while (fasting).  Sexually transmitted disease (STD) testing.  BRCA-related cancer screening. This may be done if you have a family history of breast, ovarian, tubal, or peritoneal cancers.  Pelvic exam and Pap test. This may be done every 3 years starting at age 62. Starting at age 76, this may be done every 5 years if you have a Pap test in combination with an HPV test. Talk with your health care  provider about your test results, treatment options, and if necessary, the need for more tests. Follow these instructions at home: Eating and drinking   Eat a diet that includes fresh fruits and vegetables, whole grains, lean protein, and low-fat dairy.  Take vitamin and mineral supplements as recommended by your health care provider.  Do not drink alcohol  if: ? Your health care provider tells you not to drink. ? You are pregnant, may be pregnant, or are planning to become pregnant.  If you drink alcohol: ? Limit how much you have to 0-1 drink a day. ? Be aware of how much alcohol is in your drink. In the [place] one drink equals one 12 oz bottle of beer (355 mL), one 5 oz glass of wine (148 mL), or one 1 oz glass of hard liquor (44 mL). Lifestyle  Take daily care of your teeth and gums.  Stay active. Exercise for at least 30 minutes on 5 or more days each week.  Do not use any products that contain nicotine or tobacco, such as cigarettes, e-cigarettes, and chewing tobacco. If you need help quitting, ask your health care provider.  If you are sexually active, practice safe sex. Use a condom or other form of birth control (contraception) in order to prevent pregnancy and STIs (sexually transmitted infections). If you plan to become pregnant, see your health care provider for a preconception visit. What's next?  Visit your health care provider once a year for a well check visit.  Ask your health care provider how often you should have your eyes and teeth checked.  Stay up to date on all vaccines. This information is not intended to replace advice given to you by your health care provider. Make sure you discuss any questions you have with your health care provider. Document Released: 09/23/2001 Document Revised: 04/08/2018 Document Reviewed: 04/08/2018 Elsevier Patient Education  2020 [employer].

## 2019-03-18 ENCOUNTER — Other Ambulatory Visit: Payer: 59

## 2019-03-18 ENCOUNTER — Other Ambulatory Visit: Payer: Self-pay

## 2019-03-18 DIAGNOSIS — F411 Generalized anxiety disorder: Secondary | ICD-10-CM | POA: Diagnosis not present

## 2019-03-18 DIAGNOSIS — E669 Obesity, unspecified: Secondary | ICD-10-CM

## 2019-03-18 DIAGNOSIS — K219 Gastro-esophageal reflux disease without esophagitis: Secondary | ICD-10-CM | POA: Diagnosis not present

## 2019-03-18 LAB — CMP14+EGFR
ALT: 26 IU/L (ref 0–32)
AST: 24 IU/L (ref 0–40)
Albumin/Globulin Ratio: 1.8 (ref 1.2–2.2)
Albumin: 4.6 g/dL (ref 3.9–5.0)
Alkaline Phosphatase: 57 IU/L (ref 39–117)
BUN/Creatinine Ratio: 11 (ref 9–23)
BUN: 9 mg/dL (ref 6–20)
Bilirubin Total: 0.5 mg/dL (ref 0.0–1.2)
CO2: 22 mmol/L (ref 20–29)
Calcium: 9.4 mg/dL (ref 8.7–10.2)
Chloride: 100 mmol/L (ref 96–106)
Creatinine, Ser: 0.84 mg/dL (ref 0.57–1.00)
GFR calc Af Amer: 112 mL/min/{1.73_m2} (ref >59)
GFR calc non Af Amer: 97 mL/min/{1.73_m2} (ref >59)
Globulin, Total: 2.5 g/dL (ref 1.5–4.5)
Glucose: 90 mg/dL (ref 65–99)
Potassium: 4.4 mmol/L (ref 3.5–5.2)
Sodium: 136 mmol/L (ref 134–144)
Total Protein: 7.1 g/dL (ref 6.0–8.5)

## 2019-03-18 LAB — TB SKIN TEST
Induration: 0 mm
TB Skin Test: NEGATIVE

## 2019-03-18 LAB — LIPID PANEL
Chol/HDL Ratio: 5.1 ratio — ABNORMAL HIGH (ref 0.0–4.4)
Cholesterol, Total: 167 mg/dL (ref 100–199)
HDL: 33 mg/dL — ABNORMAL LOW (ref >39)
LDL Calculated: 96 mg/dL (ref 0–99)
Triglycerides: 190 mg/dL — ABNORMAL HIGH (ref 0–149)
VLDL Cholesterol Cal: 38 mg/dL (ref 5–40)

## 2019-05-19 MED FILL — ESCITALOPRAM 5 MG TABLET: 5 | 30 days supply | Qty: 30 | Fill #0

## 2019-05-30 ENCOUNTER — Other Ambulatory Visit: Payer: Self-pay

## 2019-05-30 ENCOUNTER — Encounter: Payer: Self-pay | Admitting: Family Medicine

## 2019-05-30 ENCOUNTER — Ambulatory Visit (INDEPENDENT_AMBULATORY_CARE_PROVIDER_SITE_OTHER): Payer: 59 | Admitting: Family Medicine

## 2019-05-30 DIAGNOSIS — H9201 Otalgia, right ear: Secondary | ICD-10-CM | POA: Diagnosis not present

## 2019-05-30 DIAGNOSIS — H9211 Otorrhea, right ear: Secondary | ICD-10-CM | POA: Diagnosis not present

## 2019-05-30 MED ORDER — CIPROFLOXACIN HCL 500 MG PO TABS: 500.0000 mg | ORAL_TABLET | Freq: Two times a day (BID) | ORAL | 0 refills | Status: DC

## 2019-05-30 MED ORDER — CIPROFLOXACIN HCL 500 MG PO TABS: 500.0000 mg | ORAL_TABLET | Freq: Two times a day (BID) | ORAL | 0 refills | Status: AC

## 2019-05-30 NOTE — Progress Notes (Signed)
Virtual Visit via telephone Note Due to COVID-19 pandemic this visit was conducted virtually. This visit type was conducted due to national recommendations for restrictions regarding the COVID-19 Pandemic (e.g. social distancing, sheltering in place) in an effort to limit this patient's exposure and mitigate transmission in our community. All issues noted in this document were discussed and addressed.  A physical exam was not performed with this format.   I connected with Marliss Coots on 05/30/19 at 1150 by telephone and verified that I am speaking with the correct person using two identifiers. Archana DOLORAS BERTO is currently located at home and family is currently with them during visit. The provider, Monia Pouch, FNP is located in their office at time of visit.  I discussed the limitations, risks, security and privacy concerns of performing an evaluation and management service by telephone and the availability of in person appointments. I also discussed with the patient that there may be a patient responsible charge related to this service. The patient expressed understanding and agreed to proceed.  Subjective:  Patient ID: Alexandra Jones, female    DOB: 1995/12/03, 23 y.o.   MRN: [id number]  Chief Complaint:  Otalgia   HPI: BHAVIKA BIANCONI is a 23 y.o. female presenting on 05/30/2019 for Otalgia   Pt reports recurrent right ear pain with drainage. States she has had this at least three times in the last year. States she has fullness in her ear with clear drainage. Denies halo ring from drainage on pillow. States she has been treated with Ciprodex several times. States this is very expensive and she can not afford it. She has not seen ENT.  Otalgia  There is pain in the right ear. This is a recurrent problem. The current episode started more than 1 year ago. The problem has been waxing and waning. There has been no fever. The pain is at a severity of 2/10. The pain is mild. Associated symptoms  include ear discharge and hearing loss. Pertinent negatives include no abdominal pain, coughing, diarrhea, headaches, neck pain, rash, rhinorrhea, sore throat or vomiting. The treatment provided moderate relief.     Relevant past medical, surgical, family, and social history reviewed and updated as indicated.  Allergies and medications reviewed and updated.   Past Medical History:  Diagnosis Date  . Anxiety disorder 03/14/2019   Last Assessment & Plan:  Controlled with Lexapro 5 mg PO QD.  Marland Kitchen Diarrhea 05/01/2017  . GERD (gastroesophageal reflux disease)     Past Surgical History:  Procedure Laterality Date  . CHOLECYSTECTOMY    . COLONOSCOPY N/A 06/22/2017   Procedure: COLONOSCOPY;  Surgeon: Danie Binder, MD;  Location: [facility];  Service: Endoscopy;  Laterality: N/A;  1:45pm  . wisdom teeth extraction      Social History   Socioeconomic History  . Marital status: Single    Spouse name: Not on file  . Number of children: Not on file  . Years of education: Not on file  . Highest education level: Not on file  Occupational History  . Not on file  Social Needs  . Financial resource strain: Not on file  . Food insecurity    Worry: Not on file    Inability: Not on file  . Transportation needs    Medical: Not on file    Non-medical: Not on file  Tobacco Use  . Smoking status: Never Smoker  . Smokeless tobacco: Never Used  Substance and Sexual Activity  . Alcohol  use: Yes    Alcohol/week: 0.0 standard drinks    Comment: occasional  . Drug use: No  . Sexual activity: Yes    Birth control/protection: I.U.D.  Lifestyle  . Physical activity    Days per week: Not on file    Minutes per session: Not on file  . Stress: Not on file  Relationships  . Social Herbalist on phone: Not on file    Gets together: Not on file    Attends religious service: Not on file    Active member of club or organization: Not on file    Attends meetings of clubs or  organizations: Not on file    Relationship status: Not on file  . Intimate partner violence    Fear of current or ex partner: Not on file    Emotionally abused: Not on file    Physically abused: Not on file    Forced sexual activity: Not on file  Other Topics Concern  . Not on file  Social History Narrative  . Not on file    Outpatient Encounter Medications as of 05/30/2019  Medication Sig  . ciprofloxacin (CIPRO) 500 MG tablet Take 1 tablet (500 mg total) by mouth 2 (two) times daily for 10 days.  Marland Kitchen escitalopram (LEXAPRO) 10 MG tablet Take 0.5 tablets (5 mg total) by mouth daily.  . famotidine (PEPCID) 20 MG tablet Take 20 mg by mouth 2 (two) times daily.  Marland Kitchen ibuprofen (ADVIL,MOTRIN) 200 MG tablet Take 400 mg daily as needed by mouth for headache or moderate pain.  . Levonorgestrel (LILETTA, 52 MG,) 19.5 MCG/DAY IUD IUD 1 each by Intrauterine route once.  . loratadine (CLARITIN) 10 MG tablet Take 10 mg daily as needed by mouth for allergies.  . Multiple Vitamin (MULTI-VITAMINS) TABS Take by mouth.  . Probiotic CAPS Take 1 capsule daily by mouth.  . [DISCONTINUED] ciprofloxacin (CIPRO) 500 MG tablet Take 1 tablet (500 mg total) by mouth 2 (two) times daily for 5 days.   No facility-administered encounter medications on file as of 05/30/2019.     Allergies  Allergen Reactions  . Hydrocodone Hives  . Penicillins Rash    fever Has patient had a PCN reaction causing immediate rash, facial/tongue/throat swelling, SOB or lightheadedness with hypotension: Yes Has patient had a PCN reaction causing severe rash involving mucus membranes or skin necrosis: No Has patient had a PCN reaction that required hospitalization: No Has patient had a PCN reaction occurring within the last 10 years: Yes If all of the above answers are "NO", then may proceed with Cephalosporin use.     Review of Systems  Constitutional: Negative for activity change, appetite change, chills, fatigue and fever.   HENT: Positive for ear discharge, ear pain and hearing loss. Negative for congestion, dental problem, facial swelling, postnasal drip, rhinorrhea and sore throat.   Eyes: Negative.   Respiratory: Negative for cough, chest tightness and shortness of breath.   Cardiovascular: Negative for chest pain, palpitations and leg swelling.  Gastrointestinal: Negative for abdominal pain, blood in stool, constipation, diarrhea, nausea and vomiting.  Endocrine: Negative.   Genitourinary: Negative for dysuria, frequency and urgency.  Musculoskeletal: Negative for arthralgias, myalgias and neck pain.  Skin: Negative.  Negative for rash.  Allergic/Immunologic: Negative.   Neurological: Negative for dizziness, weakness, light-headedness and headaches.  Hematological: Negative.   Psychiatric/Behavioral: Negative for confusion, hallucinations, sleep disturbance and suicidal ideas.  All other systems reviewed and are negative.  Observations/Objective: No vital signs or physical exam, this was a telephone or virtual health encounter.  Pt alert and oriented, answers all questions appropriately, and able to speak in full sentences.    Assessment and Plan: Chandel was seen today for otalgia.  Diagnoses and all orders for this visit:  Drainage from right ear Acute otalgia, right Likely recurrent otitis externa. Due to recurrence of symptoms despite treatment, will refer to ENT. Pt unable to afford Ciprodex so will treat with oral Cirpo. Report any new, worsening, or persistent symptoms. Follow up with ENT as discussed.  -     Ambulatory referral to ENT -     ciprofloxacin (CIPRO) 500 MG tablet; Take 1 tablet (500 mg total) by mouth 2 (two) times daily for 10 days.     Follow Up Instructions: No follow-ups on file.    I discussed the assessment and treatment plan with the patient. The patient was provided an opportunity to ask questions and all were answered. The patient agreed with the plan and  demonstrated an understanding of the instructions.   The patient was advised to call back or seek an in-person evaluation if the symptoms worsen or if the condition fails to improve as anticipated.  The above assessment and management plan was discussed with the patient. The patient verbalized understanding of and has agreed to the management plan. Patient is aware to call the clinic if they develop any new symptoms or if symptoms persist or worsen. Patient is aware when to return to the clinic for a follow-up visit. Patient educated on when it is appropriate to go to the emergency department.    I provided 15 minutes of non-face-to-face time during this encounter. The call started at 1150. The call ended at 1205. The other time was used for coordination of care.    Monia Pouch, FNP-C [facility] [address] [place], [place] [address] [phone] 05/30/19

## 2019-05-31 ENCOUNTER — Telehealth: Payer: Self-pay | Admitting: Family Medicine

## 2019-06-01 ENCOUNTER — Ambulatory Visit (INDEPENDENT_AMBULATORY_CARE_PROVIDER_SITE_OTHER): Payer: 59 | Admitting: Family Medicine

## 2019-06-01 ENCOUNTER — Encounter: Payer: Self-pay | Admitting: Family Medicine

## 2019-06-01 ENCOUNTER — Ambulatory Visit
Admission: EM | Admit: 2019-06-01 | Discharge: 2019-06-01 | Disposition: A | Discharge status: Home / Self Care | Payer: 59 | Attending: Emergency Medicine | Admitting: Emergency Medicine

## 2019-06-01 ENCOUNTER — Other Ambulatory Visit: Payer: Self-pay

## 2019-06-01 DIAGNOSIS — H9201 Otalgia, right ear: Secondary | ICD-10-CM | POA: Diagnosis not present

## 2019-06-01 DIAGNOSIS — H6241 Otitis externa in other diseases classified elsewhere, right ear: Secondary | ICD-10-CM

## 2019-06-01 DIAGNOSIS — H60391 Other infective otitis externa, right ear: Secondary | ICD-10-CM | POA: Diagnosis not present

## 2019-06-01 DIAGNOSIS — B369 Superficial mycosis, unspecified: Secondary | ICD-10-CM

## 2019-06-01 MED ORDER — CLOTRIMAZOLE 1 % EX SOLN: CUTANEOUS | 0 refills | Status: DC

## 2019-06-01 NOTE — ED Provider Notes (Signed)
[facility]   QB:[id number] 06/01/19 Arrival Time: [id number]  CC: RT EAR PAIN  SUBJECTIVE: History from: patient.  Alexandra Jones is a 23 y.o. female who presents with of RT ear pain and drainage x 2 weeks.  Denies a precipitating event, such as swimming or wearing ear plugs.  Patient states the pain is constant and sharp/ shooting in character.  Patient has tried ciprodex ear drops and ciprofloxacin 500 mg without relief.  Symptoms are made worse with using ciprodex ear drops.  Reports similar symptoms in the past that improved with ear drops.  Complains of associated tinnitus, and changes in hearing in RT ear.  Denies fever, chills, fatigue, sinus pain, rhinorrhea, sore throat, SOB, wheezing, chest pain, nausea, changes in bowel or bladder habits.    ROS: As per HPI.  All other pertinent ROS negative.     Past Medical History:  Diagnosis Date  . Anxiety disorder 03/14/2019   Last Assessment & Plan:  Controlled with Lexapro 5 mg PO QD.  Marland Kitchen Diarrhea 05/01/2017  . GERD (gastroesophageal reflux disease)    Past Surgical History:  Procedure Laterality Date  . CHOLECYSTECTOMY    . COLONOSCOPY N/A 06/22/2017   Procedure: COLONOSCOPY;  Surgeon: Danie Binder, MD;  Location: [facility];  Service: Endoscopy;  Laterality: N/A;  1:45pm  . wisdom teeth extraction     Allergies  Allergen Reactions  . Hydrocodone Hives  . Penicillins Rash    fever Has patient had a PCN reaction causing immediate rash, facial/tongue/throat swelling, SOB or lightheadedness with hypotension: Yes Has patient had a PCN reaction causing severe rash involving mucus membranes or skin necrosis: No Has patient had a PCN reaction that required hospitalization: No Has patient had a PCN reaction occurring within the last 10 years: Yes If all of the above answers are "NO", then may proceed with Cephalosporin use.    No current facility-administered medications on file prior to encounter.    Current Outpatient  Medications on File Prior to Encounter  Medication Sig Dispense Refill  . ciprofloxacin (CIPRO) 500 MG tablet Take 1 tablet (500 mg total) by mouth 2 (two) times daily for 10 days. 20 tablet 0  . escitalopram (LEXAPRO) 10 MG tablet Take 0.5 tablets (5 mg total) by mouth daily. 45 tablet 3  . famotidine (PEPCID) 20 MG tablet Take 20 mg by mouth 2 (two) times daily.    Marland Kitchen ibuprofen (ADVIL,MOTRIN) 200 MG tablet Take 400 mg daily as needed by mouth for headache or moderate pain.    . Levonorgestrel (LILETTA, 52 MG,) 19.5 MCG/DAY IUD IUD 1 each by Intrauterine route once.    . loratadine (CLARITIN) 10 MG tablet Take 10 mg daily as needed by mouth for allergies.    . Multiple Vitamin (MULTI-VITAMINS) TABS Take by mouth.    . Probiotic CAPS Take 1 capsule daily by mouth.     Social History   Socioeconomic History  . Marital status: Single    Spouse name: Not on file  . Number of children: Not on file  . Years of education: Not on file  . Highest education level: Not on file  Occupational History  . Not on file  Social Needs  . Financial resource strain: Not on file  . Food insecurity    Worry: Not on file    Inability: Not on file  . Transportation needs    Medical: Not on file    Non-medical: Not on file  Tobacco Use  .  Smoking status: Never Smoker  . Smokeless tobacco: Never Used  Substance and Sexual Activity  . Alcohol use: Yes    Alcohol/week: 0.0 standard drinks    Comment: occasional  . Drug use: No  . Sexual activity: Yes    Birth control/protection: I.U.D.  Lifestyle  . Physical activity    Days per week: Not on file    Minutes per session: Not on file  . Stress: Not on file  Relationships  . Social Herbalist on phone: Not on file    Gets together: Not on file    Attends religious service: Not on file    Active member of club or organization: Not on file    Attends meetings of clubs or organizations: Not on file    Relationship status: Not on file  .  Intimate partner violence    Fear of current or ex partner: Not on file    Emotionally abused: Not on file    Physically abused: Not on file    Forced sexual activity: Not on file  Other Topics Concern  . Not on file  Social History Narrative  . Not on file   Family History  Problem Relation Age of Onset  . Prostate cancer Father   . Hypertension Maternal Grandmother   . Breast cancer Maternal Grandmother 60  . Kidney failure Maternal Grandmother   . Colon cancer Maternal Grandfather 71  . Heart failure Maternal Grandfather   . Prostate cancer Maternal Grandfather   . Celiac disease Neg Hx   . Crohn's disease Neg Hx   . Ulcerative colitis Neg Hx     OBJECTIVE:  Vitals:   06/01/19 1732  BP: 113/88  Pulse: 95  Resp: 18  Temp: 99.4 F (37.4 C)  SpO2: 96%     General appearance: alert; appears mildly fatigued, but nontoxic HEENT: NCAT; port-wine stain over LT side of face; Ears: LT EAC clear, LT TM pearly gray with visible cone of light, without erythema, RT EAC with white exudate, unable to visualize TM, tenderness with tragal manipulation; Eyes: PERRL, EOMI grossly; Nose: patent without rhinorrhea; Throat: oropharynx clear, tonsils not enlarged or erythematous without white tonsillar exudates, uvula midline Neck: supple without LAD Lungs: unlabored respirations, symmetrical air entry; cough: absent; no respiratory distress Heart: regular rate and rhythm.  Skin: warm and dry Psychological: alert and cooperative; normal mood and affect  ASSESSMENT & PLAN:  1. Infective otitis externa of right ear   2. Otomycosis of right ear     Meds ordered this encounter  Medications  . clotrimazole (LOTRIMIN) 1 % external solution    Sig: Instill 4 to 5 drops into the affected ear(s) twice daily for 10 to 14 days    Dispense:  60 mL    Refill:  0    Order Specific Question:   Supervising Provider    Answer:   Raylene Everts [id number]   Rest and drink plenty of fluids  Continue with antibiotic as prescribed.   Lotrimin solution prescribed to cover for possible fungal infection Continue to use OTC ibuprofen and/ or tylenol as needed for pain control Follow up with PCP in 1-2 days for recheck and to ensure symptoms are improving.   Return here or go to the ER if you have any new or worsening symptoms fever, chills, nausea, vomiting, increased pain, discharge, redness, changes in hearing, symptoms do not improve despite treatment, etc...  Reviewed expectations re: course of current medical issues.  Questions answered. Outlined signs and symptoms indicating need for more acute intervention. Patient verbalized understanding. After Visit Summary given.         Lestine Box, PA-C 06/01/19 1756

## 2019-06-01 NOTE — ED Triage Notes (Signed)
Pt has had right ear pain with drainage for past 2 weeks

## 2019-06-01 NOTE — Progress Notes (Signed)
Virtual Visit via Telephone Note  I connected with Alexandra Jones on 06/01/19 at 1:23 PM by telephone and verified that I am speaking with the correct person using two identifiers. Alexandra Jones is currently located at work and nobody is currently with her during this visit. The provider, Loman Brooklyn, FNP is located in their home at time of visit.  I discussed the limitations, risks, security and privacy concerns of performing an evaluation and management service by telephone and the availability of in person appointments. I also discussed with the patient that there may be a patient responsible charge related to this service. The patient expressed understanding and agreed to proceed.  Subjective: PCP: Loman Brooklyn, FNP  Chief Complaint  Patient presents with  . Ear Pain   Patient was evaluated via telephone two days ago due to right ear pain. She has used remaining drops from ear infection in July and she has also been taking the oral Cipro BID. She reports a constant ball of sharp pressure with pain shooting down her neck and a constant ringing since last night when she used the ear drops. A referral was placed to ENT two days ago as well.    ROS: Per HPI  Current Outpatient Medications:  .  ciprofloxacin (CIPRO) 500 MG tablet, Take 1 tablet (500 mg total) by mouth 2 (two) times daily for 10 days., Disp: 20 tablet, Rfl: 0 .  escitalopram (LEXAPRO) 10 MG tablet, Take 0.5 tablets (5 mg total) by mouth daily., Disp: 45 tablet, Rfl: 3 .  famotidine (PEPCID) 20 MG tablet, Take 20 mg by mouth 2 (two) times daily., Disp: , Rfl:  .  ibuprofen (ADVIL,MOTRIN) 200 MG tablet, Take 400 mg daily as needed by mouth for headache or moderate pain., Disp: , Rfl:  .  Levonorgestrel (LILETTA, 52 MG,) 19.5 MCG/DAY IUD IUD, 1 each by Intrauterine route once., Disp: , Rfl:  .  loratadine (CLARITIN) 10 MG tablet, Take 10 mg daily as needed by mouth for allergies., Disp: , Rfl:  .  Multiple Vitamin  (MULTI-VITAMINS) TABS, Take by mouth., Disp: , Rfl:  .  Probiotic CAPS, Take 1 capsule daily by mouth., Disp: , Rfl:   Allergies  Allergen Reactions  . Hydrocodone Hives  . Penicillins Rash    fever Has patient had a PCN reaction causing immediate rash, facial/tongue/throat swelling, SOB or lightheadedness with hypotension: Yes Has patient had a PCN reaction causing severe rash involving mucus membranes or skin necrosis: No Has patient had a PCN reaction that required hospitalization: No Has patient had a PCN reaction occurring within the last 10 years: Yes If all of the above answers are "NO", then may proceed with Cephalosporin use.    Past Medical History:  Diagnosis Date  . Anxiety disorder 03/14/2019   Last Assessment & Plan:  Controlled with Lexapro 5 mg PO QD.  Marland Kitchen Diarrhea 05/01/2017  . GERD (gastroesophageal reflux disease)     Observations/Objective: A&O  No respiratory distress or wheezing audible over the phone Mood, judgement, and thought processes all WNL  Assessment and Plan: 1. Right ear pain - Encouraged patient to go to urgent care so that someone could look in her ears and see what is going on due to worsening ear pain despite treatment.    Follow Up Instructions:  I discussed the assessment and treatment plan with the patient. The patient was provided an opportunity to ask questions and all were answered. The patient agreed with the  plan and demonstrated an understanding of the instructions.   The patient was advised to call back or seek an in-person evaluation if the symptoms worsen or if the condition fails to improve as anticipated.  The above assessment and management plan was discussed with the patient. The patient verbalized understanding of and has agreed to the management plan. Patient is aware to call the clinic if symptoms persist or worsen. Patient is aware when to return to the clinic for a follow-up visit. Patient educated on when it is appropriate  to go to the emergency department.   Time call ended: 1:30 PM  I provided 9 minutes of non-face-to-face time during this encounter.  Hendricks Limes, MSN, APRN, FNP-C [facility] 06/01/19

## 2019-06-01 NOTE — Discharge Instructions (Signed)
Rest and drink plenty of fluids Continue with antibiotic as prescribed.   Lotrimin solution prescribed to cover for possible fungal infection Continue to use OTC ibuprofen and/ or tylenol as needed for pain control Follow up with PCP in 1-2 days for recheck and to ensure symptoms are improving.   Return here or go to the ER if you have any new or worsening symptoms fever, chills, nausea, vomiting, increased pain, discharge, redness, changes in hearing, symptoms do not improve despite treatment, etc..Marland Kitchen

## 2019-06-02 ENCOUNTER — Encounter: Payer: Self-pay | Admitting: Family Medicine

## 2019-06-06 ENCOUNTER — Telehealth: Payer: Self-pay | Admitting: Family Medicine

## 2019-06-06 NOTE — Telephone Encounter (Signed)
lmtcb

## 2019-06-15 DIAGNOSIS — H60331 Swimmer's ear, right ear: Secondary | ICD-10-CM | POA: Diagnosis not present

## 2019-06-23 ENCOUNTER — Other Ambulatory Visit: Payer: Self-pay | Admitting: *Deleted

## 2019-06-23 DIAGNOSIS — F411 Generalized anxiety disorder: Secondary | ICD-10-CM

## 2019-06-30 ENCOUNTER — Ambulatory Visit (INDEPENDENT_AMBULATORY_CARE_PROVIDER_SITE_OTHER): Payer: 59 | Admitting: Otolaryngology

## 2019-06-30 ENCOUNTER — Other Ambulatory Visit: Payer: Self-pay

## 2019-07-01 ENCOUNTER — Other Ambulatory Visit: Payer: Self-pay

## 2019-07-01 DIAGNOSIS — Z20822 Contact with and (suspected) exposure to covid-19: Secondary | ICD-10-CM

## 2019-07-03 LAB — NOVEL CORONAVIRUS, NAA: SARS-CoV-2, NAA: NOT DETECTED

## 2019-07-05 ENCOUNTER — Encounter: Payer: Self-pay | Admitting: Family Medicine

## 2019-07-05 ENCOUNTER — Other Ambulatory Visit: Payer: Self-pay

## 2019-07-05 DIAGNOSIS — F411 Generalized anxiety disorder: Secondary | ICD-10-CM

## 2019-07-05 MED ORDER — ESCITALOPRAM OXALATE 5 MG PO TABS: 5.0000 mg | ORAL_TABLET | Freq: Every day | ORAL | 2 refills | Status: DC

## 2019-07-05 MED FILL — ESCITALOPRAM 5 MG TABLET: 5 | 90 days supply | Qty: 90 | Fill #0

## 2019-07-14 ENCOUNTER — Ambulatory Visit (INDEPENDENT_AMBULATORY_CARE_PROVIDER_SITE_OTHER): Payer: 59 | Admitting: Otolaryngology

## 2019-07-14 ENCOUNTER — Other Ambulatory Visit: Payer: Self-pay

## 2019-07-25 ENCOUNTER — Encounter: Payer: Self-pay | Admitting: Family Medicine

## 2019-07-25 ENCOUNTER — Ambulatory Visit (INDEPENDENT_AMBULATORY_CARE_PROVIDER_SITE_OTHER): Payer: 59 | Admitting: Family Medicine

## 2019-07-25 DIAGNOSIS — Z20828 Contact with and (suspected) exposure to other viral communicable diseases: Secondary | ICD-10-CM | POA: Diagnosis not present

## 2019-07-25 DIAGNOSIS — J029 Acute pharyngitis, unspecified: Secondary | ICD-10-CM | POA: Diagnosis not present

## 2019-07-25 MED ORDER — AZITHROMYCIN 250 MG PO TABS: ORAL_TABLET | ORAL | 0 refills | Status: DC

## 2019-07-25 NOTE — Progress Notes (Signed)
Virtual Visit via Telephone Note  I connected with Alexandra Jones on 07/25/19 at 9:08 AM by telephone and verified that I am speaking with the correct person using two identifiers. Alexandra Jones is currently located at home and nobody is currently with her during this visit. The provider, Loman Brooklyn, FNP is located in their home at time of visit.  I discussed the limitations, risks, security and privacy concerns of performing an evaluation and management service by telephone and the availability of in person appointments. I also discussed with the patient that there may be a patient responsible charge related to this service. The patient expressed understanding and agreed to proceed.  Subjective: PCP: Loman Brooklyn, FNP  Chief Complaint  Patient presents with  . Sore Throat   Patient complains of sore throat. Additional symptoms include left ear pain and fever (>102). Onset of symptoms was 2 days ago, gradually worsening since that time. She is drinking plenty of fluids. Evaluation to date: none. Treatment to date: Dayquil & Ibuprofen. She does not have a history of asthma, allergies, or COPD. She does not smoke.    ROS: Per HPI  Current Outpatient Medications:  .  azithromycin (ZITHROMAX) 250 MG tablet, As directed, Disp: 6 tablet, Rfl: 0 .  clotrimazole (LOTRIMIN) 1 % external solution, Instill 4 to 5 drops into the affected ear(s) twice daily for 10 to 14 days, Disp: 60 mL, Rfl: 0 .  escitalopram (LEXAPRO) 5 MG tablet, Take 1 tablet (5 mg total) by mouth daily., Disp: 90 tablet, Rfl: 2 .  famotidine (PEPCID) 20 MG tablet, Take 20 mg by mouth 2 (two) times daily., Disp: , Rfl:  .  ibuprofen (ADVIL,MOTRIN) 200 MG tablet, Take 400 mg daily as needed by mouth for headache or moderate pain., Disp: , Rfl:  .  Levonorgestrel (LILETTA, 52 MG,) 19.5 MCG/DAY IUD IUD, 1 each by Intrauterine route once., Disp: , Rfl:  .  loratadine (CLARITIN) 10 MG tablet, Take 10 mg daily as needed by  mouth for allergies., Disp: , Rfl:  .  Multiple Vitamin (MULTI-VITAMINS) TABS, Take by mouth., Disp: , Rfl:  .  Probiotic CAPS, Take 1 capsule daily by mouth., Disp: , Rfl:   Allergies  Allergen Reactions  . Hydrocodone Hives  . Penicillins Rash    fever Has patient had a PCN reaction causing immediate rash, facial/tongue/throat swelling, SOB or lightheadedness with hypotension: Yes Has patient had a PCN reaction causing severe rash involving mucus membranes or skin necrosis: No Has patient had a PCN reaction that required hospitalization: No Has patient had a PCN reaction occurring within the last 10 years: Yes If all of the above answers are "NO", then may proceed with Cephalosporin use.    Past Medical History:  Diagnosis Date  . Anxiety disorder 03/14/2019   Last Assessment & Plan:  Controlled with Lexapro 5 mg PO QD.  Marland Kitchen Diarrhea 05/01/2017  . GERD (gastroesophageal reflux disease)     Observations/Objective: A&O  No respiratory distress or wheezing audible over the phone Mood, judgement, and thought processes all WNL  Assessment and Plan: 1. Sore throat - Treating empirically as strep throat.  Encourage patient to go get a tested for COVID-19 due to the current pandemic. - azithromycin (ZITHROMAX) 250 MG tablet; As directed  Dispense: 6 tablet; Refill: 0   Follow Up Instructions:  I discussed the assessment and treatment plan with the patient. The patient was provided an opportunity to ask questions and all were  answered. The patient agreed with the plan and demonstrated an understanding of the instructions.   The patient was advised to call back or seek an in-person evaluation if the symptoms worsen or if the condition fails to improve as anticipated.  The above assessment and management plan was discussed with the patient. The patient verbalized understanding of and has agreed to the management plan. Patient is aware to call the clinic if symptoms persist or worsen.  Patient is aware when to return to the clinic for a follow-up visit. Patient educated on when it is appropriate to go to the emergency department.   Time call ended: 9:17 AM  I provided 13 minutes of non-face-to-face time during this encounter.  Hendricks Limes, MSN, APRN, FNP-C [facility] 07/25/19

## 2019-07-28 ENCOUNTER — Encounter: Payer: Self-pay | Admitting: Family Medicine

## 2019-07-28 ENCOUNTER — Other Ambulatory Visit: Payer: Self-pay

## 2019-07-28 ENCOUNTER — Ambulatory Visit (INDEPENDENT_AMBULATORY_CARE_PROVIDER_SITE_OTHER): Payer: 59 | Admitting: Family Medicine

## 2019-07-28 DIAGNOSIS — J039 Acute tonsillitis, unspecified: Secondary | ICD-10-CM | POA: Diagnosis not present

## 2019-07-28 MED ORDER — PREDNISONE 10 MG (21) PO TBPK: ORAL_TABLET | ORAL | 0 refills | Status: DC

## 2019-07-28 NOTE — Progress Notes (Signed)
    Subjective:    Patient ID: Alexandra Jones, female    DOB: 12/18/1995, 23 y.o.   MRN: [id number]   HPI: Alexandra Jones is a 23 y.o. female presenting for one week of sore throat. Tonsils have exudate. Fever went to 102. Got the zpack on 12/14. Still having fever to 100.4 . Throat improving, but still sore. HAving a lot of mouth ulcers - make it hard to chew.    Depression screen [facility] 2/9 03/16/2019 01/13/2018  Decreased Interest 0 0  Down, Depressed, Hopeless 0 0  PHQ - 2 Score 0 0     Relevant past medical, surgical, family and social history reviewed and updated as indicated.  Interim medical history since our last visit reviewed. Allergies and medications reviewed and updated.  ROS:  Review of Systems  Constitutional: Positive for activity change, appetite change and fever. Negative for chills and diaphoresis.  HENT: Positive for mouth sores, sore throat and trouble swallowing. Negative for congestion, ear pain, hearing loss, postnasal drip and rhinorrhea.   Respiratory: Negative for cough, chest tightness and shortness of breath.   Skin: Negative for rash.     Social History   Tobacco Use  Smoking Status Never Smoker  Smokeless Tobacco Never Used       Objective:     Wt Readings from Last 3 Encounters:  03/16/19 248 lb 12.8 oz (112.9 kg)  01/13/18 236 lb (107 kg)  05/01/17 226 lb 9.6 oz (102.8 kg)     Exam deferred. Pt. Harboring due to COVID 19. Phone visit performed.   Assessment & Plan:   1. Tonsillitis     Meds ordered this encounter  Medications  . predniSONE (STERAPRED UNI-PAK 21 TAB) 10 MG (21) TBPK tablet    Sig: Use as package directs    Dispense:  21 tablet    Refill:  0    No orders of the defined types were placed in this encounter.     Diagnoses and all orders for this visit:  Tonsillitis  Other orders -     predniSONE (STERAPRED UNI-PAK 21 TAB) 10 MG (21) TBPK tablet; Use as package directs    Virtual Visit via telephone  Note  I discussed the limitations, risks, security and privacy concerns of performing an evaluation and management service by telephone and the availability of in person appointments. The patient was identified with two identifiers. Pt.expressed understanding and agreed to proceed. Pt. Is at home. Dr. Livia Snellen is in his office.  Follow Up Instructions:   I discussed the assessment and treatment plan with the patient. The patient was provided an opportunity to ask questions and all were answered. The patient agreed with the plan and demonstrated an understanding of the instructions.   The patient was advised to call back or seek an in-person evaluation if the symptoms worsen or if the condition fails to improve as anticipated.   Total minutes including chart review and phone contact time: 9   Follow up plan: Return if symptoms worsen or fail to improve.  Claretta Fraise, MD [facility]

## 2019-08-11 ENCOUNTER — Other Ambulatory Visit: Payer: Self-pay

## 2019-08-11 ENCOUNTER — Ambulatory Visit: Payer: 59 | Attending: Internal Medicine

## 2019-08-11 DIAGNOSIS — Z20822 Contact with and (suspected) exposure to covid-19: Secondary | ICD-10-CM

## 2019-08-11 DIAGNOSIS — Z20828 Contact with and (suspected) exposure to other viral communicable diseases: Secondary | ICD-10-CM | POA: Diagnosis not present

## 2019-08-13 LAB — NOVEL CORONAVIRUS, NAA: SARS-CoV-2, NAA: DETECTED — AB

## 2019-08-26 DIAGNOSIS — H93293 Other abnormal auditory perceptions, bilateral: Secondary | ICD-10-CM | POA: Diagnosis not present

## 2019-08-26 DIAGNOSIS — H9313 Tinnitus, bilateral: Secondary | ICD-10-CM | POA: Diagnosis not present

## 2019-08-26 DIAGNOSIS — R42 Dizziness and giddiness: Secondary | ICD-10-CM | POA: Diagnosis not present

## 2019-08-26 DIAGNOSIS — H60331 Swimmer's ear, right ear: Secondary | ICD-10-CM | POA: Diagnosis not present

## 2019-09-09 DIAGNOSIS — H60331 Swimmer's ear, right ear: Secondary | ICD-10-CM | POA: Diagnosis not present

## 2019-10-03 MED FILL — ESCITALOPRAM 5 MG TABLET: 5 | 90 days supply | Qty: 90 | Fill #1

## 2019-11-10 ENCOUNTER — Ambulatory Visit: Payer: Self-pay | Attending: Internal Medicine

## 2019-11-10 DIAGNOSIS — Z23 Encounter for immunization: Secondary | ICD-10-CM

## 2019-11-10 NOTE — Progress Notes (Signed)
   Covid-19 Vaccination Clinic  Name:  Alexandra Jones    MRN: [id number] DOB: [birth year]  11/10/2019  Alexandra Jones was observed post Covid-19 immunization for 15 minutes without incident. She was provided with Vaccine Information Sheet and instruction to access the V-Safe system.   Alexandra Jones was instructed to call 911 with any severe reactions post vaccine: Marland Kitchen Difficulty breathing  . Swelling of face and throat  . A fast heartbeat  . A bad rash all over body  . Dizziness and weakness   Immunizations Administered    Name Date Dose VIS Date Route   Moderna COVID-19 Vaccine 11/10/2019 10:39 AM 0.5 mL 07/12/2019 Intramuscular   Manufacturer: Moderna   Lot: [id number]   [id number]

## 2019-11-14 ENCOUNTER — Ambulatory Visit (INDEPENDENT_AMBULATORY_CARE_PROVIDER_SITE_OTHER): Payer: PRIVATE HEALTH INSURANCE | Admitting: Family Medicine

## 2019-11-14 ENCOUNTER — Other Ambulatory Visit: Payer: Self-pay

## 2019-11-14 ENCOUNTER — Encounter: Payer: Self-pay | Admitting: Family Medicine

## 2019-11-14 VITALS — BP 124/75 | HR 88 | Temp 99.6°F | Ht 66.0 in | Wt 261.2 lb

## 2019-11-14 DIAGNOSIS — I459 Conduction disorder, unspecified: Secondary | ICD-10-CM | POA: Diagnosis not present

## 2019-11-14 NOTE — Progress Notes (Signed)
Assessment & Plan:  1. Skipped heart beats - Ambulatory referral to Cardiology  2. Morbid obesity ([place]) - Referral to Nutrition and Diabetes Services  Reassurance provided on her blood pressure. I did also give her education on the DASH diet.    Follow up plan: Return if symptoms worsen or fail to improve.  Hendricks Limes, MSN, APRN, FNP-C [facility]  Subjective:   Patient ID: Alexandra Jones, female    DOB: Dec 16, 1995, 24 y.o.   MRN: [id number]  HPI: Alexandra Jones is a 24 y.o. female presenting on 11/14/2019 for Hypertension (Patient states she took her bp lastnight and it was 130/90's.) and Bradycardia (Patient states she notices that her HR will be in the 40-50's.)  Patient is concerned about her blood pressure and heart rate. She states last night when she took her BP it was 130/83. She did use an arm cuff. She has never gotten another reading this high.  Her heart rate is going as low as 40-50s at night. She has a pulse oximeter and is checking her heart rate with it. She is not sleeping when she gets these readings but is resting in the bed. She reports she only sees the low readings at night.   The part that gives her the most anxiety is that she can feel herself have an irregular heart beats for a second at random times, and then her anxiety starts acting up because of it. She denies any dizziness or feeling faint. No chest pains. She does feel this skipping during the day and at night.    ROS: Negative unless specifically indicated above in HPI.   Relevant past medical history reviewed and updated as indicated.   Allergies and medications reviewed and updated.   Current Outpatient Medications:  .  escitalopram (LEXAPRO) 5 MG tablet, Take 1 tablet (5 mg total) by mouth daily., Disp: 90 tablet, Rfl: 2 .  famotidine (PEPCID) 20 MG tablet, Take 20 mg by mouth 2 (two) times daily., Disp: , Rfl:  .  ibuprofen (ADVIL,MOTRIN) 200 MG tablet, Take 400 mg  daily as needed by mouth for headache or moderate pain., Disp: , Rfl:  .  Levonorgestrel (LILETTA, 52 MG,) 19.5 MCG/DAY IUD IUD, 1 each by Intrauterine route once., Disp: , Rfl:  .  loratadine (CLARITIN) 10 MG tablet, Take 10 mg daily as needed by mouth for allergies., Disp: , Rfl:  .  Multiple Vitamin (MULTI-VITAMINS) TABS, Take by mouth., Disp: , Rfl:  .  Probiotic CAPS, Take 1 capsule daily by mouth., Disp: , Rfl:   Allergies  Allergen Reactions  . Hydrocodone Hives  . Penicillins Rash    fever Has patient had a PCN reaction causing immediate rash, facial/tongue/throat swelling, SOB or lightheadedness with hypotension: Yes Has patient had a PCN reaction causing severe rash involving mucus membranes or skin necrosis: No Has patient had a PCN reaction that required hospitalization: No Has patient had a PCN reaction occurring within the last 10 years: Yes If all of the above answers are "NO", then may proceed with Cephalosporin use.     Objective:   BP 124/75   Pulse 88   Temp 99.6 F (37.6 C) (Temporal)   Ht 5\' 6"  (1.676 m)   Wt 261 lb 3.2 oz (118.5 kg)   SpO2 99%   BMI 42.16 kg/m    Physical Exam Vitals reviewed.  Constitutional:      General: She is not in acute distress.  Appearance: Normal appearance. She is morbidly obese. She is not ill-appearing, toxic-appearing or diaphoretic.  HENT:     Head: Normocephalic and atraumatic.  Eyes:     General: No scleral icterus.       Right eye: No discharge.        Left eye: No discharge.     Conjunctiva/sclera: Conjunctivae normal.  Cardiovascular:     Rate and Rhythm: Normal rate and regular rhythm.     Heart sounds: Normal heart sounds. No murmur. No friction rub. No gallop.   Pulmonary:     Effort: Pulmonary effort is normal. No respiratory distress.     Breath sounds: Normal breath sounds. No stridor. No wheezing, rhonchi or rales.  Musculoskeletal:        General: Normal range of motion.     Cervical back: Normal  range of motion.  Skin:    General: Skin is warm and dry.     Capillary Refill: Capillary refill takes less than 2 seconds.  Neurological:     General: No focal deficit present.     Mental Status: She is alert and oriented to person, place, and time. Mental status is at baseline.  Psychiatric:        Mood and Affect: Mood normal.        Behavior: Behavior normal.        Thought Content: Thought content normal.        Judgment: Judgment normal.

## 2019-11-14 NOTE — Patient Instructions (Signed)
DASH Eating Plan DASH stands for "Dietary Approaches to Stop Hypertension." The DASH eating plan is a healthy eating plan that has been shown to reduce high blood pressure (hypertension). It may also reduce your risk for type 2 diabetes, heart disease, and stroke. The DASH eating plan may also help with weight loss. What are tips for following this plan?  General guidelines  Avoid eating more than 2,300 mg (milligrams) of salt (sodium) a day. If you have hypertension, you may need to reduce your sodium intake to 1,500 mg a day.  Limit alcohol intake to no more than 1 drink a day for nonpregnant women and 2 drinks a day for men. One drink equals 12 oz of beer, 5 oz of wine, or 1 oz of hard liquor.  Work with your health care provider to maintain a healthy body weight or to lose weight. Ask what an ideal weight is for you.  Get at least 30 minutes of exercise that causes your heart to beat faster (aerobic exercise) most days of the week. Activities may include walking, swimming, or biking.  Work with your health care provider or diet and nutrition specialist (dietitian) to adjust your eating plan to your individual calorie needs. Reading food labels   Check food labels for the amount of sodium per serving. Choose foods with less than 5 percent of the Daily Value of sodium. Generally, foods with less than 300 mg of sodium per serving fit into this eating plan.  To find whole grains, look for the word "whole" as the first word in the ingredient list. Shopping  Buy products labeled as "low-sodium" or "no salt added."  Buy fresh foods. Avoid canned foods and premade or frozen meals. Cooking  Avoid adding salt when cooking. Use salt-free seasonings or herbs instead of table salt or sea salt. Check with your health care provider or pharmacist before using salt substitutes.  Do not fry foods. Cook foods using healthy methods such as baking, boiling, grilling, and broiling instead.  Cook with  heart-healthy oils, such as olive, canola, soybean, or sunflower oil. Meal planning  Eat a balanced diet that includes: ? 5 or more servings of fruits and vegetables each day. At each meal, try to fill half of your plate with fruits and vegetables. ? Up to 6-8 servings of whole grains each day. ? Less than 6 oz of lean meat, poultry, or fish each day. A 3-oz serving of meat is about the same size as a deck of cards. One egg equals 1 oz. ? 2 servings of low-fat dairy each day. ? A serving of nuts, seeds, or beans 5 times each week. ? Heart-healthy fats. Healthy fats called Omega-3 fatty acids are found in foods such as flaxseeds and coldwater fish, like sardines, salmon, and mackerel.  Limit how much you eat of the following: ? Canned or prepackaged foods. ? Food that is high in trans fat, such as fried foods. ? Food that is high in saturated fat, such as fatty meat. ? Sweets, desserts, sugary drinks, and other foods with added sugar. ? Full-fat dairy products.  Do not salt foods before eating.  Try to eat at least 2 vegetarian meals each week.  Eat more home-cooked food and less restaurant, buffet, and fast food.  When eating at a restaurant, ask that your food be prepared with less salt or no salt, if possible. What foods are recommended? The items listed may not be a complete list. Talk with your dietitian about   what dietary choices are best for you. Grains Whole-grain or whole-wheat bread. Whole-grain or whole-wheat pasta. Brown rice. Oatmeal. Quinoa. Bulgur. Whole-grain and low-sodium cereals. Pita bread. Low-fat, low-sodium crackers. Whole-wheat flour tortillas. Vegetables Fresh or frozen vegetables (raw, steamed, roasted, or grilled). Low-sodium or reduced-sodium tomato and vegetable juice. Low-sodium or reduced-sodium tomato sauce and tomato paste. Low-sodium or reduced-sodium canned vegetables. Fruits All fresh, dried, or frozen fruit. Canned fruit in natural juice (without  added sugar). Meat and other protein foods Skinless chicken or turkey. Ground chicken or turkey. Pork with fat trimmed off. Fish and seafood. Egg whites. Dried beans, peas, or lentils. Unsalted nuts, nut butters, and seeds. Unsalted canned beans. Lean cuts of beef with fat trimmed off. Low-sodium, lean deli meat. Dairy Low-fat (1%) or fat-free (skim) milk. Fat-free, low-fat, or reduced-fat cheeses. Nonfat, low-sodium ricotta or cottage cheese. Low-fat or nonfat yogurt. Low-fat, low-sodium cheese. Fats and oils Soft margarine without trans fats. Vegetable oil. Low-fat, reduced-fat, or light mayonnaise and salad dressings (reduced-sodium). Canola, safflower, olive, soybean, and sunflower oils. Avocado. Seasoning and other foods Herbs. Spices. Seasoning mixes without salt. Unsalted popcorn and pretzels. Fat-free sweets. What foods are not recommended? The items listed may not be a complete list. Talk with your dietitian about what dietary choices are best for you. Grains Baked goods made with fat, such as croissants, muffins, or some breads. Dry pasta or rice meal packs. Vegetables Creamed or fried vegetables. Vegetables in a cheese sauce. Regular canned vegetables (not low-sodium or reduced-sodium). Regular canned tomato sauce and paste (not low-sodium or reduced-sodium). Regular tomato and vegetable juice (not low-sodium or reduced-sodium). Pickles. Olives. Fruits Canned fruit in a light or heavy syrup. Fried fruit. Fruit in cream or butter sauce. Meat and other protein foods Fatty cuts of meat. Ribs. Fried meat. Bacon. Sausage. Bologna and other processed lunch meats. Salami. Fatback. Hotdogs. Bratwurst. Salted nuts and seeds. Canned beans with added salt. Canned or smoked fish. Whole eggs or egg yolks. Chicken or turkey with skin. Dairy Whole or 2% milk, cream, and half-and-half. Whole or full-fat cream cheese. Whole-fat or sweetened yogurt. Full-fat cheese. Nondairy creamers. Whipped toppings.  Processed cheese and cheese spreads. Fats and oils Butter. Stick margarine. Lard. Shortening. Ghee. Bacon fat. Tropical oils, such as coconut, palm kernel, or palm oil. Seasoning and other foods Salted popcorn and pretzels. Onion salt, garlic salt, seasoned salt, table salt, and sea salt. Worcestershire sauce. Tartar sauce. Barbecue sauce. Teriyaki sauce. Soy sauce, including reduced-sodium. Steak sauce. Canned and packaged gravies. Fish sauce. Oyster sauce. Cocktail sauce. Horseradish that you find on the shelf. Ketchup. Mustard. Meat flavorings and tenderizers. Bouillon cubes. Hot sauce and Tabasco sauce. Premade or packaged marinades. Premade or packaged taco seasonings. Relishes. Regular salad dressings. Where to find more information:  National Heart, Lung, and Blood Institute: www.nhlbi.nih.gov  American Heart Association: www.heart.org Summary  The DASH eating plan is a healthy eating plan that has been shown to reduce high blood pressure (hypertension). It may also reduce your risk for type 2 diabetes, heart disease, and stroke.  With the DASH eating plan, you should limit salt (sodium) intake to 2,300 mg a day. If you have hypertension, you may need to reduce your sodium intake to 1,500 mg a day.  When on the DASH eating plan, aim to eat more fresh fruits and vegetables, whole grains, lean proteins, low-fat dairy, and heart-healthy fats.  Work with your health care provider or diet and nutrition specialist (dietitian) to adjust your eating plan to your   individual calorie needs. This information is not intended to replace advice given to you by your health care provider. Make sure you discuss any questions you have with your health care provider. Document Revised: 07/10/2017 Document Reviewed: 07/21/2016 Elsevier Patient Education  2020 Elsevier Inc.  

## 2019-11-17 ENCOUNTER — Encounter: Payer: Self-pay | Admitting: Family Medicine

## 2019-11-18 ENCOUNTER — Telehealth: Payer: Self-pay | Admitting: Cardiovascular Disease

## 2019-11-18 ENCOUNTER — Encounter: Payer: Self-pay | Admitting: Cardiovascular Disease

## 2019-11-18 ENCOUNTER — Ambulatory Visit (INDEPENDENT_AMBULATORY_CARE_PROVIDER_SITE_OTHER): Payer: Self-pay | Admitting: Cardiovascular Disease

## 2019-11-18 ENCOUNTER — Other Ambulatory Visit: Payer: Self-pay

## 2019-11-18 VITALS — BP 138/82 | HR 94 | Ht 65.0 in | Wt 260.0 lb

## 2019-11-18 DIAGNOSIS — R001 Bradycardia, unspecified: Secondary | ICD-10-CM

## 2019-11-18 DIAGNOSIS — R002 Palpitations: Secondary | ICD-10-CM

## 2019-11-18 NOTE — Patient Instructions (Addendum)
Medication Instructions:  Continue all current medications.  Labwork: TSH, Magnesium, BMET - lab order given today.   Testing/Procedures:  Your physician has requested that you have an echocardiogram. Echocardiography is a painless test that uses sound waves to create images of your heart. It provides your doctor with information about the size and shape of your heart and how well your heart's chambers and valves are working. This procedure takes approximately one hour. There are no restrictions for this procedure.  Your physician has recommended that you wear a 3 week event monitor. Event monitors are medical devices that record the heart's electrical activity. Doctors most often [place] these monitors to diagnose arrhythmias. Arrhythmias are problems with the speed or rhythm of the heartbeat. The monitor is a small, portable device. You can wear one while you do your normal daily activities. This is usually used to diagnose what is causing palpitations/syncope (passing out).   Follow-Up:  Office will contact with results via phone or letter.    3 months   Any Other Special Instructions Will Be Listed Below (If Applicable).  If you need a refill on your cardiac medications before your next appointment, please call your pharmacy.

## 2019-11-18 NOTE — Telephone Encounter (Signed)
Pre-cert Verification for the following procedure    CARDIAC EVENT MONITOR  

## 2019-11-18 NOTE — Progress Notes (Signed)
CARDIOLOGY CONSULT NOTE  Patient ID: KASH PEZZANO MRN: [id number] DOB/AGE: 05-Apr-1996 24 y.o.  Admit date: (Not on file) Primary Physician: Loman Brooklyn, FNP  Reason for Consultation: Palpitations  HPI: Alexandra Jones is a 24 y.o. female who is being seen today for the evaluation of palpitations at the request of Loman Brooklyn, FNP.   I reviewed notes by her PCP in the EMR.  She has had bradycardia with heart rates in the 40 to 50s at night.  She was not asleep but resting in bed when these occurred.  She has also had some diastolic hypertension.  She denies associated chest pain.  Past medical history also includes obesity.  ECG performed in the office today which I ordered and personally interpreted demonstrates normal sinus rhythm with no ischemic ST segment or T-wave abnormalities, nor any arrhythmias.  She has noticed irregularity with her heartbeat over the past 2 months.  She has been awoken by palpitations.  She does snore but denies witnessed apneic episodes.  She denies leg swelling, orthopnea, and paroxysmal nocturnal dyspnea.   Allergies  Allergen Reactions  . Hydrocodone Hives  . Penicillins Rash    fever Has patient had a PCN reaction causing immediate rash, facial/tongue/throat swelling, SOB or lightheadedness with hypotension: Yes Has patient had a PCN reaction causing severe rash involving mucus membranes or skin necrosis: No Has patient had a PCN reaction that required hospitalization: No Has patient had a PCN reaction occurring within the last 10 years: Yes If all of the above answers are "NO", then may proceed with Cephalosporin use.     Current Outpatient Medications  Medication Sig Dispense Refill  . escitalopram (LEXAPRO) 5 MG tablet Take 1 tablet (5 mg total) by mouth daily. 90 tablet 2  . famotidine (PEPCID) 20 MG tablet Take 20 mg by mouth daily.     Marland Kitchen ibuprofen (ADVIL,MOTRIN) 200 MG tablet Take 400 mg daily as needed by mouth for  headache or moderate pain.    . Levonorgestrel (LILETTA, 52 MG,) 19.5 MCG/DAY IUD IUD 1 each by Intrauterine route once.    . loratadine (CLARITIN) 10 MG tablet Take 10 mg daily as needed by mouth for allergies.    . Multiple Vitamin (MULTI-VITAMINS) TABS Take by mouth.    . Probiotic CAPS Take 1 capsule daily by mouth.     No current facility-administered medications for this visit.    Past Medical History:  Diagnosis Date  . Anxiety disorder 03/14/2019   Last Assessment & Plan:  Controlled with Lexapro 5 mg PO QD.  Marland Kitchen Diarrhea 05/01/2017  . GERD (gastroesophageal reflux disease)     Past Surgical History:  Procedure Laterality Date  . CHOLECYSTECTOMY    . COLONOSCOPY N/A 06/22/2017   Procedure: COLONOSCOPY;  Surgeon: Danie Binder, MD;  Location: [facility];  Service: Endoscopy;  Laterality: N/A;  1:45pm  . wisdom teeth extraction      Social History   Socioeconomic History  . Marital status: Single    Spouse name: Not on file  . Number of children: Not on file  . Years of education: Not on file  . Highest education level: Not on file  Occupational History  . Not on file  Tobacco Use  . Smoking status: Never Smoker  . Smokeless tobacco: Never Used  Substance and Sexual Activity  . Alcohol use: Yes    Alcohol/week: 0.0 standard drinks    Comment: occasional  .  Drug use: No  . Sexual activity: Yes    Birth control/protection: I.U.D.  Other Topics Concern  . Not on file  Social History Narrative  . Not on file   Social Determinants of Health   Financial Resource Strain:   . Difficulty of Paying Living Expenses:   Food Insecurity:   . Worried About Charity fundraiser in the Last Year:   . Arboriculturist in the Last Year:   Transportation Needs:   . Film/video editor (Medical):   Marland Kitchen Lack of Transportation (Non-Medical):   Physical Activity:   . Days of Exercise per Week:   . Minutes of Exercise per Session:   Stress:   . Feeling of Stress :     Social Connections:   . Frequency of Communication with Friends and Family:   . Frequency of Social Gatherings with Friends and Family:   . Attends Religious Services:   . Active Member of Clubs or Organizations:   . Attends Archivist Meetings:   Marland Kitchen Marital Status:   Intimate Partner Violence:   . Fear of Current or Ex-Partner:   . Emotionally Abused:   Marland Kitchen Physically Abused:   . Sexually Abused:      No family history of premature CAD in 1st degree relatives.  Current Meds  Medication Sig  . escitalopram (LEXAPRO) 5 MG tablet Take 1 tablet (5 mg total) by mouth daily.  . famotidine (PEPCID) 20 MG tablet Take 20 mg by mouth daily.   Marland Kitchen ibuprofen (ADVIL,MOTRIN) 200 MG tablet Take 400 mg daily as needed by mouth for headache or moderate pain.  . Levonorgestrel (LILETTA, 52 MG,) 19.5 MCG/DAY IUD IUD 1 each by Intrauterine route once.  . loratadine (CLARITIN) 10 MG tablet Take 10 mg daily as needed by mouth for allergies.  . Multiple Vitamin (MULTI-VITAMINS) TABS Take by mouth.  . Probiotic CAPS Take 1 capsule daily by mouth.      Review of systems complete and found to be negative unless listed above in HPI   [facility], LPN was present throughout the entirety of the encounter.  Physical exam Blood pressure 138/82, pulse 94, height 5\' 5"  (1.651 m), weight 260 lb (117.9 kg), SpO2 98 %. General: NAD Neck: No JVD, no thyromegaly or thyroid nodule.  Lungs: Clear to auscultation bilaterally with normal respiratory effort. CV: Nondisplaced PMI. Regular rate and rhythm, normal S1/S2, no S3/S4, no murmur.  No peripheral edema.  No carotid bruit.    Abdomen: Soft, nontender, no distention.  Skin: Intact without lesions or rashes.  Neurologic: Alert and oriented x 3.  Psych: Normal affect. Extremities: No clubbing or cyanosis.  HEENT: Normal.   ECG: Most recent ECG reviewed.   Labs: Lab Results  Component Value Date/Time   K 4.4 03/18/2019 10:56 AM   BUN 9  03/18/2019 10:56 AM   CREATININE 0.84 03/18/2019 10:56 AM   ALT 26 03/18/2019 10:56 AM   HGB 13.6 01/28/2016 03:30 PM     Lipids: Lab Results  Component Value Date/Time   LDLCALC 96 03/18/2019 10:56 AM   CHOL 167 03/18/2019 10:56 AM   TRIG 190 (H) 03/18/2019 10:56 AM   HDL 33 (L) 03/18/2019 10:56 AM        ASSESSMENT AND PLAN:   1.  Palpitations and bradycardia: I will obtain a 3-week event monitor. I will order a 2-D echocardiogram with Doppler to evaluate cardiac structure, function, and regional wall motion.  I will also check  a TSH, magnesium level, and basic metabolic panel.      Disposition: Follow up in 3 months  Signed: Kate Sable, M.D., F.A.C.C.  11/18/2019, 1:33 PM

## 2019-11-22 ENCOUNTER — Other Ambulatory Visit: Payer: Self-pay | Admitting: Cardiovascular Disease

## 2019-11-22 DIAGNOSIS — I499 Cardiac arrhythmia, unspecified: Secondary | ICD-10-CM

## 2019-11-22 DIAGNOSIS — R001 Bradycardia, unspecified: Secondary | ICD-10-CM

## 2019-11-23 ENCOUNTER — Ambulatory Visit (INDEPENDENT_AMBULATORY_CARE_PROVIDER_SITE_OTHER): Payer: PRIVATE HEALTH INSURANCE

## 2019-11-23 DIAGNOSIS — R002 Palpitations: Secondary | ICD-10-CM

## 2019-11-23 DIAGNOSIS — R001 Bradycardia, unspecified: Secondary | ICD-10-CM

## 2019-11-28 ENCOUNTER — Telehealth: Payer: Self-pay | Admitting: Cardiovascular Disease

## 2019-11-28 NOTE — Telephone Encounter (Signed)
Reports having a rash where monitor was placed. Advised to use cortisone cream and apply a new patch using the other positional option. Also advised to contact [employer] to see if they have patches for sensitive skin. Verbalized understanding.

## 2019-11-28 NOTE — Telephone Encounter (Signed)
Thinks she is having an allergic reaction to the adhesive strips on monitor

## 2019-12-02 NOTE — Telephone Encounter (Signed)
Reports that she has stopped wearing the heart monitor due to being allergic to the patch.

## 2019-12-07 ENCOUNTER — Other Ambulatory Visit: Payer: Self-pay

## 2019-12-07 ENCOUNTER — Ambulatory Visit (INDEPENDENT_AMBULATORY_CARE_PROVIDER_SITE_OTHER): Payer: PRIVATE HEALTH INSURANCE

## 2019-12-07 ENCOUNTER — Telehealth: Payer: Self-pay | Admitting: Cardiovascular Disease

## 2019-12-07 DIAGNOSIS — I499 Cardiac arrhythmia, unspecified: Secondary | ICD-10-CM

## 2019-12-07 DIAGNOSIS — R001 Bradycardia, unspecified: Secondary | ICD-10-CM | POA: Diagnosis not present

## 2019-12-07 NOTE — Telephone Encounter (Signed)
Patient stated labs were done on 11/21/2019 at the [facility].    Call placed to lab - lm to fax labs to office or return call if have a problem finding them.

## 2019-12-07 NOTE — Telephone Encounter (Signed)
Patient asking about lab results - stated she had the labs drawn on November 21, 2019

## 2019-12-08 ENCOUNTER — Telehealth: Payer: Self-pay | Admitting: *Deleted

## 2019-12-08 NOTE — Telephone Encounter (Signed)
-----   Message from Herminio Commons, MD sent at 12/08/2019 10:21 AM EDT ----- Normal echo

## 2019-12-08 NOTE — Telephone Encounter (Signed)
Alexandra Jones, [place]  624THL [id number] PM EDT    Patient notified via detailed voice message. Copy to pcp.

## 2019-12-14 ENCOUNTER — Ambulatory Visit: Payer: PRIVATE HEALTH INSURANCE | Attending: Internal Medicine

## 2019-12-14 DIAGNOSIS — Z23 Encounter for immunization: Secondary | ICD-10-CM

## 2019-12-14 NOTE — Progress Notes (Signed)
   Covid-19 Vaccination Clinic  Name:  IKEA POSTEN    MRN: [id number] DOB: Oct 15, 1995  12/14/2019  Ms. Donate was observed post Covid-19 immunization for 15 minutes without incident. She was provided with Vaccine Information Sheet and instruction to access the V-Safe system.   Ms. [place] was instructed to call 911 with any severe reactions post vaccine: Marland Kitchen Difficulty breathing  . Swelling of face and throat  . A fast heartbeat  . A bad rash all over body  . Dizziness and weakness   Immunizations Administered    Name Date Dose VIS Date Route   Moderna COVID-19 Vaccine 12/14/2019  8:43 AM 0.5 mL 07/2019 Intramuscular   Manufacturer: Moderna   Lot: [id number]   [id number]

## 2019-12-19 NOTE — Telephone Encounter (Signed)
Alexandra Jones, [place]  624THL 624THL AM EDT    Patient notified via detailed voice message.  Copy to pcp,    Herminio Commons, MD  12/15/2019 6:11 PM EDT    Good results.

## 2019-12-27 ENCOUNTER — Encounter: Payer: PRIVATE HEALTH INSURANCE | Attending: Family Medicine | Admitting: Nutrition

## 2020-01-18 ENCOUNTER — Other Ambulatory Visit: Payer: Self-pay

## 2020-03-18 NOTE — Progress Notes (Deleted)
Cardiology Office Note  Date: 03/18/2020   ID: MYSTIC LABO, DOB 1996-08-11, MRN [id number]  PCP:  Loman Brooklyn, FNP  Cardiologist:  No primary care provider on file. Electrophysiologist:  None   Chief Complaint: Palpitations  History of Present Illness: Alexandra Jones is a 24 y.o. female with a history of palpitations, bradycardia, anxiety.  Last encounter with Dr. Bronson Ing 11/18/2019 seen for palpitations at the request of her PCP.  She has been having episodes of bradycardia at night with some diastolic hypertension.  EKG was normal in the office.  She had noticed some irregularity in her heartbeat over the previous 2 months and had been awakened by palpitations during the night.  An event monitor and an echocardiogram were ordered along with TSH, BMP and magnesium.    Past Medical History:  Diagnosis Date  . Anxiety disorder 03/14/2019   Last Assessment & Plan:  Controlled with Lexapro 5 mg PO QD.  Marland Kitchen Diarrhea 05/01/2017  . GERD (gastroesophageal reflux disease)     Past Surgical History:  Procedure Laterality Date  . CHOLECYSTECTOMY    . COLONOSCOPY N/A 06/22/2017   Procedure: COLONOSCOPY;  Surgeon: Danie Binder, MD;  Location: [facility];  Service: Endoscopy;  Laterality: N/A;  1:45pm  . wisdom teeth extraction      Current Outpatient Medications  Medication Sig Dispense Refill  . escitalopram (LEXAPRO) 5 MG tablet Take 1 tablet (5 mg total) by mouth daily. 90 tablet 2  . famotidine (PEPCID) 20 MG tablet Take 20 mg by mouth daily.     Marland Kitchen ibuprofen (ADVIL,MOTRIN) 200 MG tablet Take 400 mg daily as needed by mouth for headache or moderate pain.    . Levonorgestrel (LILETTA, 52 MG,) 19.5 MCG/DAY IUD IUD 1 each by Intrauterine route once.    . loratadine (CLARITIN) 10 MG tablet Take 10 mg daily as needed by mouth for allergies.    . Multiple Vitamin (MULTI-VITAMINS) TABS Take by mouth.    . Probiotic CAPS Take 1 capsule daily by mouth.     No current  facility-administered medications for this visit.   Allergies:  Hydrocodone and Penicillins   Social History: The patient  reports that she has never smoked. She has never used smokeless tobacco. She reports current alcohol use. She reports that she does not use drugs.   Family History: The patient's family history includes Breast cancer (age of onset: 34) in her maternal grandmother; Colon cancer (age of onset: 22) in her maternal grandfather; Heart failure in her maternal grandfather; Hypertension in her maternal grandmother; Kidney failure in her maternal grandmother; Prostate cancer in her father and maternal grandfather.   ROS:  Please see the history of present illness. Otherwise, complete review of systems is positive for {NONE DEFAULTED:18576::"none"}.  All other systems are reviewed and negative.   Physical Exam: VS:  There were no vitals taken for this visit., BMI There is no height or weight on file to calculate BMI.  Wt Readings from Last 3 Encounters:  11/18/19 260 lb (117.9 kg)  11/14/19 261 lb 3.2 oz (118.5 kg)  03/16/19 248 lb 12.8 oz (112.9 kg)    General: Patient appears comfortable at rest. HEENT: Conjunctiva and lids normal, oropharynx clear with moist mucosa. Neck: Supple, no elevated JVP or carotid bruits, no thyromegaly. Lungs: Clear to auscultation, nonlabored breathing at rest. Cardiac: Regular rate and rhythm, no S3 or significant systolic murmur, no pericardial rub. Abdomen: Soft, nontender, no hepatomegaly, bowel sounds present,  no guarding or rebound. Extremities: No pitting edema, distal pulses 2+. Skin: Warm and dry. Musculoskeletal: No kyphosis. Neuropsychiatric: Alert and oriented x3, affect grossly appropriate.  ECG:  {EKG/Telemetry Strips [phone]}  Recent Labwork: No results found for requested labs within last 8760 hours.     Component Value Date/Time   CHOL 167 03/18/2019 1056   TRIG 190 (H) 03/18/2019 1056   HDL 33 (L) 03/18/2019  1056   CHOLHDL 5.1 (H) 03/18/2019 1056   LDLCALC 96 03/18/2019 1056    Other Studies Reviewed Today:  Echocardiogram 12/07/2019 1. Left ventricular ejection fraction, by estimation, is 60 to 65%. The left ventricle has normal function. The left ventricle has no regional wall motion abnormalities. Left ventricular diastolic parameters were normal. 2. Right ventricular systolic function is normal. The right ventricular size is normal. Tricuspid regurgitation signal is inadequate for assessing PA pressure. 3. Left atrial size was upper normal. 4. The mitral valve is grossly normal. Trivial mitral valve regurgitation. 5. The aortic valve is tricuspid. Aortic valve regurgitation is not visualized. 6. The inferior vena cava is normal in size with greater than 50% respiratory variability, suggesting right atrial pressure of 3 mmHg.  Cardiac event monitor  11/23/2019 Summary: The patient's monitoring period was 11/23/2019 - 12/13/2019. Baseline sample showed Sinus Arrhythmia with a heart rate of 70.1 bpm. There were 0 critical, 0 serious, and 2 stable events that occurred. The report analysis of the critical, serious, stable and manually triggered events are listed below. Automatically Detected Events: Manually Detected Events: 1 Stable: Sinus Arrhythmia 1 Stable: Sinus Tachycardia w/Artifact/Lead Loss   Assessment and Plan:  1. Palpitations    1. Palpitations ***  Medication Adjustments/Labs and Tests Ordered: Current medicines are reviewed at length with the patient today.  Concerns regarding medicines are outlined above.   Disposition: Follow-up with ***  Signed, Levell July, NP 03/18/2020 8:46 PM    [facility] [address], [place], [place] [address] Phone: [phone]; Fax: [phone]

## 2020-03-19 ENCOUNTER — Ambulatory Visit: Payer: Self-pay | Admitting: Family Medicine

## 2020-03-20 ENCOUNTER — Ambulatory Visit: Payer: Self-pay | Admitting: Cardiovascular Disease

## 2020-03-26 ENCOUNTER — Ambulatory Visit (INDEPENDENT_AMBULATORY_CARE_PROVIDER_SITE_OTHER): Payer: PRIVATE HEALTH INSURANCE | Admitting: Family Medicine

## 2020-03-26 ENCOUNTER — Other Ambulatory Visit: Payer: Self-pay

## 2020-03-26 DIAGNOSIS — Z111 Encounter for screening for respiratory tuberculosis: Secondary | ICD-10-CM

## 2020-03-28 ENCOUNTER — Ambulatory Visit: Payer: PRIVATE HEALTH INSURANCE

## 2020-03-28 ENCOUNTER — Encounter: Payer: Self-pay | Admitting: *Deleted

## 2020-03-28 LAB — TB SKIN TEST
Induration: 0 mm
TB Skin Test: NEGATIVE

## 2020-03-30 ENCOUNTER — Encounter: Payer: Self-pay | Admitting: Family Medicine

## 2020-04-12 ENCOUNTER — Other Ambulatory Visit: Payer: Self-pay

## 2020-04-12 ENCOUNTER — Encounter: Payer: Self-pay | Admitting: Family Medicine

## 2020-04-12 ENCOUNTER — Ambulatory Visit (INDEPENDENT_AMBULATORY_CARE_PROVIDER_SITE_OTHER): Payer: PRIVATE HEALTH INSURANCE | Admitting: Family Medicine

## 2020-04-12 VITALS — BP 120/78 | HR 71 | Temp 97.8°F | Ht 65.0 in | Wt 259.2 lb

## 2020-04-12 DIAGNOSIS — Z8669 Personal history of other diseases of the nervous system and sense organs: Secondary | ICD-10-CM

## 2020-04-12 DIAGNOSIS — Z0000 Encounter for general adult medical examination without abnormal findings: Secondary | ICD-10-CM

## 2020-04-12 DIAGNOSIS — F411 Generalized anxiety disorder: Secondary | ICD-10-CM | POA: Diagnosis not present

## 2020-04-12 DIAGNOSIS — K219 Gastro-esophageal reflux disease without esophagitis: Secondary | ICD-10-CM

## 2020-04-12 DIAGNOSIS — Z0001 Encounter for general adult medical examination with abnormal findings: Secondary | ICD-10-CM

## 2020-04-12 DIAGNOSIS — Z01118 Encounter for examination of ears and hearing with other abnormal findings: Secondary | ICD-10-CM

## 2020-04-12 MED ORDER — ESCITALOPRAM OXALATE 5 MG PO TABS
7.5000 mg | ORAL_TABLET | Freq: Every day | ORAL | 0 refills | Status: DC
  Filled 2021-03-11: qty 45, 30d supply, fill #0

## 2020-04-12 NOTE — Patient Instructions (Signed)
 Preventive Care 21-26 Years Old, Female Preventive care refers to visits with your health care provider and lifestyle choices that can promote health and wellness. This includes:  A yearly physical exam. This may also be called an annual well check.  Regular dental visits and eye exams.  Immunizations.  Screening for certain conditions.  Healthy lifestyle choices, such as eating a healthy diet, getting regular exercise, not using drugs or products that contain nicotine and tobacco, and limiting alcohol use. What can I expect for my preventive care visit? Physical exam Your health care provider will check your:  Height and weight. This may be used to calculate body mass index (BMI), which tells if you are at a healthy weight.  Heart rate and blood pressure.  Skin for abnormal spots. Counseling Your health care provider may ask you questions about your:  Alcohol, tobacco, and drug use.  Emotional well-being.  Home and relationship well-being.  Sexual activity.  Eating habits.  Work and work environment.  Method of birth control.  Menstrual cycle.  Pregnancy history. What immunizations do I need?  Influenza (flu) vaccine  This is recommended every year. Tetanus, diphtheria, and pertussis (Tdap) vaccine  You may need a Td booster every 10 years. Varicella (chickenpox) vaccine  You may need this if you have not been vaccinated. Human papillomavirus (HPV) vaccine  If recommended by your health care provider, you may need three doses over 6 months. Measles, mumps, and rubella (MMR) vaccine  You may need at least one dose of MMR. You may also need a second dose. Meningococcal conjugate (MenACWY) vaccine  One dose is recommended if you are age 19-21 years and a first-year college student living in a residence hall, or if you have one of several medical conditions. You may also need additional booster doses. Pneumococcal conjugate (PCV13) vaccine  You may need  this if you have certain conditions and were not previously vaccinated. Pneumococcal polysaccharide (PPSV23) vaccine  You may need one or two doses if you smoke cigarettes or if you have certain conditions. Hepatitis A vaccine  You may need this if you have certain conditions or if you travel or work in places where you may be exposed to hepatitis A. Hepatitis B vaccine  You may need this if you have certain conditions or if you travel or work in places where you may be exposed to hepatitis B. Haemophilus influenzae type b (Hib) vaccine  You may need this if you have certain conditions. You may receive vaccines as individual doses or as more than one vaccine together in one shot (combination vaccines). Talk with your health care provider about the risks and benefits of combination vaccines. What tests do I need?  Blood tests  Lipid and cholesterol levels. These may be checked every 5 years starting at age 20.  Hepatitis C test.  Hepatitis B test. Screening  Diabetes screening. This is done by checking your blood sugar (glucose) after you have not eaten for a while (fasting).  Sexually transmitted disease (STD) testing.  BRCA-related cancer screening. This may be done if you have a family history of breast, ovarian, tubal, or peritoneal cancers.  Pelvic exam and Pap test. This may be done every 3 years starting at age 21. Starting at age 30, this may be done every 5 years if you have a Pap test in combination with an HPV test. Talk with your health care provider about your test results, treatment options, and if necessary, the need for more   Follow these instructions at home: °Eating and drinking ° °· Eat a diet that includes fresh fruits and vegetables, whole grains, lean protein, and low-fat dairy. °· Take vitamin and mineral supplements as recommended by your health care provider. °· Do not drink alcohol if: °? Your health care provider tells you not to drink. °? You are  pregnant, may be pregnant, or are planning to become pregnant. °· If you drink alcohol: °? Limit how much you have to 0-1 drink a day. °? Be aware of how much alcohol is in your drink. In the [place] one drink equals one 12 oz bottle of beer (355 mL), one 5 oz glass of wine (148 mL), or one 1½ oz glass of hard liquor (44 mL). °Lifestyle °· Take daily care of your teeth and gums. °· Stay active. Exercise for at least 30 minutes on 5 or more days each week. °· Do not use any products that contain nicotine or tobacco, such as cigarettes, e-cigarettes, and chewing tobacco. If you need help quitting, ask your health care provider. °· If you are sexually active, practice safe sex. Use a condom or other form of birth control (contraception) in order to prevent pregnancy and STIs (sexually transmitted infections). If you plan to become pregnant, see your health care provider for a preconception visit. °What's next? °· Visit your health care provider once a year for a well check visit. °· Ask your health care provider how often you should have your eyes and teeth checked. °· Stay up to date on all vaccines. °This information is not intended to replace advice given to you by your health care provider. Make sure you discuss any questions you have with your health care provider. °Document Revised: 04/08/2018 Document Reviewed: 04/08/2018 °Elsevier Patient Education © 2020 [employer] Inc. ° °

## 2020-04-12 NOTE — Progress Notes (Signed)
Assessment & Plan:  1. Well adult exam - Preventive health education provided. Patient declined HIV and hepatitis C screening. She is UTD with TDAP, COVID-19 vaccines, and pap smear.   2. Generalized anxiety disorder - Somewhat controlled, but could be better. Patient is going to take 7.5 mg daily and see how she feels.  - escitalopram (LEXAPRO) 5 MG tablet; Take 1.5 tablets (7.5 mg total) by mouth daily.  Dispense: 135 tablet; Refill: 3  3. Gastroesophageal reflux disease, unspecified whether esophagitis present - Well controlled on current regimen.   4. Morbid obesity ([place]) - Patient is working hard with her exercise. Diet encouraged.   5. Abnormal exam of right ear - Ambulatory referral to ENT  6. History of recurrent ear infection - Ambulatory referral to ENT   Follow-up: Return in about 1 year (around 04/12/2021) for annual physical.   Hendricks Limes, MSN, APRN, FNP-C [facility]  Subjective:  Patient ID: Alexandra Jones, female    DOB: 05-22-1996  Age: 24 y.o. MRN: [id number]  Patient Care Team: Loman Brooklyn, FNP as PCP - General (Family Medicine) Danie Binder, MD (Inactive) as Consulting Physician (Gastroenterology) Jonnie Kind, MD as Consulting Physician (Obstetrics and Gynecology)   CC:  Chief Complaint  Patient presents with  . Annual Exam    HPI Alexandra Jones presents for her annual physical.   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 She is only taking Lexapro 5 mg QD. She did not feel well when she started with 10 mg  initially. She does not want to be on the medication forever but feels she needs it while she is in school.   Depression screen [facility] 2/9 04/12/2020 11/14/2019 03/16/2019  Decreased Interest 0 0 0  Down, Depressed, Hopeless 0 0 0  PHQ - 2 Score 0 0 0  Altered sleeping 2 - -  Tired, decreased energy 2 - -  Change in appetite 2 - -  Feeling bad or failure about yourself  1 - -  Trouble concentrating 0 - -  Moving slowly or fidgety/restless 1 - -  Suicidal thoughts 0 - -  PHQ-9 Score 8 - -   GAD 7 : Generalized Anxiety Score 04/12/2020 03/16/2019  Nervous, Anxious, on Edge 3 1  Control/stop worrying 3 1  Worry too much - different things 3 1  Trouble relaxing 3 1  Restless 2 0  Easily annoyed or irritable 1 1  Afraid - awful might happen 2 0  Total GAD 7 Score 17 5  Anxiety Difficulty - Not difficult at all    Patient is concerned about repeated ear infections that have not resolved after seeing Dr. Benjamine Mola, ENT for the past year.     Review of Systems  Constitutional: Negative for chills, fever, malaise/fatigue and weight loss.  HENT: Negative for congestion, nosebleeds, sinus pain and sore throat.   Eyes: Negative for blurred vision, double vision, pain, discharge and redness.  Respiratory: Negative for cough, shortness of breath and wheezing.   Cardiovascular: Negative for chest pain, palpitations and leg swelling.  Gastrointestinal: Negative for abdominal pain,  constipation, diarrhea, heartburn, nausea and vomiting.  Genitourinary: Negative for dysuria, frequency and urgency.  Musculoskeletal: Negative for myalgias.  Skin: Negative for rash.  Neurological: Negative for dizziness, seizures, weakness and headaches.  Psychiatric/Behavioral: Negative for depression, substance abuse and suicidal ideas. The patient is not nervous/anxious.     Current Outpatient Medications:  .  Cetirizine HCl (ZYRTEC PO), Take by mouth., Disp: , Rfl:  .  escitalopram (LEXAPRO) 5 MG tablet, Take 1 tablet (5  mg total) by mouth daily., Disp: 90 tablet, Rfl: 2 .  famotidine (PEPCID) 20 MG tablet, Take 20 mg by mouth daily. , Disp: , Rfl:  .  ibuprofen (ADVIL,MOTRIN) 200 MG tablet, Take 400 mg daily as needed by mouth for headache or moderate pain., Disp: , Rfl:  .  Levonorgestrel (LILETTA, 52 MG,) 19.5 MCG/DAY IUD IUD, 1 each by Intrauterine route once., Disp: , Rfl:  .  Multiple Vitamin (MULTI-VITAMINS) TABS, Take by mouth., Disp: , Rfl:  .  Probiotic CAPS, Take 1 capsule daily by mouth., Disp: , Rfl:   Allergies  Allergen Reactions  . Hydrocodone Hives  . Penicillins Rash    fever Has patient had a PCN reaction causing immediate rash, facial/tongue/throat swelling, SOB or lightheadedness with hypotension: Yes Has patient had a PCN reaction causing severe rash involving mucus membranes or skin necrosis: No Has patient had a PCN reaction that required hospitalization: No Has patient had a PCN reaction occurring within the last 10 years: Yes If all of the above answers are "NO", then may proceed with Cephalosporin use.     Past Medical History:  Diagnosis Date  . Anxiety disorder 03/14/2019   Last Assessment & Plan:  Controlled with Lexapro 5 mg PO QD.  Marland Kitchen Diarrhea 05/01/2017  . GERD (gastroesophageal reflux disease)     Past Surgical History:  Procedure Laterality Date  . CHOLECYSTECTOMY    . COLONOSCOPY N/A 06/22/2017   Procedure: COLONOSCOPY;  Surgeon: Danie Binder, MD;  Location: [facility];  Service: Endoscopy;  Laterality: N/A;  1:45pm  . wisdom teeth extraction      Family History  Problem Relation Age of Onset  . Prostate cancer Father   . Hypertension Maternal Grandmother   . Breast cancer Maternal Grandmother 8  . Kidney failure Maternal Grandmother   . Colon cancer Maternal Grandfather 60  . Heart failure Maternal Grandfather   . Prostate cancer Maternal Grandfather   . Celiac disease Neg Hx   . Crohn's disease Neg Hx   . Ulcerative colitis Neg Hx     Social  History   Socioeconomic History  . Marital status: Single    Spouse name: Not on file  . Number of children: Not on file  . Years of education: Not on file  . Highest education level: Not on file  Occupational History  . Not on file  Tobacco Use  . Smoking status: Never Smoker  . Smokeless tobacco: Never Used  Vaping Use  . Vaping Use: Never used  Substance and Sexual Activity  . Alcohol use: Yes    Alcohol/week: 0.0 standard drinks    Comment: occasional  . Drug use: No  . Sexual activity: Yes    Birth control/protection: I.U.D.  Other Topics Concern  . Not on file  Social History Narrative  . Not on file   Social Determinants of Health   Financial Resource Strain:   . Difficulty of Paying Living Expenses: Not on file  Food Insecurity:   . Worried  About Running Out of Food in the Last Year: Not on file  . Ran Out of Food in the Last Year: Not on file  Transportation Needs:   . Lack of Transportation (Medical): Not on file  . Lack of Transportation (Non-Medical): Not on file  Physical Activity:   . Days of Exercise per Week: Not on file  . Minutes of Exercise per Session: Not on file  Stress:   . Feeling of Stress : Not on file  Social Connections:   . Frequency of Communication with Friends and Family: Not on file  . Frequency of Social Gatherings with Friends and Family: Not on file  . Attends Religious Services: Not on file  . Active Member of Clubs or Organizations: Not on file  . Attends Archivist Meetings: Not on file  . Marital Status: Not on file  Intimate Partner Violence:   . Fear of Current or Ex-Partner: Not on file  . Emotionally Abused: Not on file  . Physically Abused: Not on file  . Sexually Abused: Not on file      Objective:    BP 120/78   Pulse 71   Temp 97.8 F (36.6 C) (Temporal)   Ht 5\' 5"  (1.651 m)   Wt 259 lb 3.2 oz (117.6 kg)   SpO2 98%   BMI 43.13 kg/m   Wt Readings from Last 3 Encounters:  04/12/20 259 lb 3.2  oz (117.6 kg)  11/18/19 260 lb (117.9 kg)  11/14/19 261 lb 3.2 oz (118.5 kg)    Physical Exam Vitals reviewed. Exam conducted with a chaperone present.  Constitutional:      General: She is not in acute distress.    Appearance: Normal appearance. She is morbidly obese. She is not ill-appearing, toxic-appearing or diaphoretic.  HENT:     Head: Normocephalic and atraumatic.     Right Ear: Tympanic membrane and external ear normal. There is no impacted cerumen.     Left Ear: Tympanic membrane, ear canal and external ear normal. There is no impacted cerumen.     Ears:     Comments: Debris of right ear canal.     Nose: Nose normal. No congestion or rhinorrhea.     Mouth/Throat:     Mouth: Mucous membranes are moist.     Pharynx: Oropharynx is clear. No oropharyngeal exudate or posterior oropharyngeal erythema.  Eyes:     General: No scleral icterus.       Right eye: No discharge.        Left eye: No discharge.     Conjunctiva/sclera: Conjunctivae normal.     Pupils: Pupils are equal, round, and reactive to light.  Cardiovascular:     Rate and Rhythm: Normal rate and regular rhythm.     Heart sounds: Normal heart sounds. No murmur heard.  No friction rub. No gallop.   Pulmonary:     Effort: Pulmonary effort is normal. No respiratory distress.     Breath sounds: Normal breath sounds. No stridor. No wheezing, rhonchi or rales.  Chest:     Breasts: Breasts are symmetrical.        Right: Normal.        Left: Normal.  Abdominal:     General: Abdomen is flat. Bowel sounds are normal. There is no distension.     Palpations: Abdomen is soft. There is no mass.     Tenderness: There is no abdominal tenderness. There is no guarding or rebound.  Hernia: No hernia is present.  Musculoskeletal:        General: Normal range of motion.     Cervical back: Normal range of motion and neck supple. No rigidity. No muscular tenderness.  Lymphadenopathy:     Cervical: No cervical adenopathy.      Upper Body:     Right upper body: No supraclavicular, axillary or pectoral adenopathy.     Left upper body: No supraclavicular, axillary or pectoral adenopathy.  Skin:    General: Skin is warm and dry.     Capillary Refill: Capillary refill takes less than 2 seconds.  Neurological:     General: No focal deficit present.     Mental Status: She is alert and oriented to person, place, and time. Mental status is at baseline.  Psychiatric:        Mood and Affect: Mood normal.        Behavior: Behavior normal.        Thought Content: Thought content normal.        Judgment: Judgment normal.    No results found for: TSH Lab Results  Component Value Date   WBC 5.0 01/28/2016   HGB 13.6 01/28/2016   HCT 39.8 01/28/2016   MCV 87.7 01/28/2016   PLT 132 (L) 01/28/2016   Lab Results  Component Value Date   NA 136 03/18/2019   K 4.4 03/18/2019   CO2 22 03/18/2019   GLUCOSE 90 03/18/2019   BUN 9 03/18/2019   CREATININE 0.84 03/18/2019   BILITOT 0.5 03/18/2019   ALKPHOS 57 03/18/2019   AST 24 03/18/2019   ALT 26 03/18/2019   PROT 7.1 03/18/2019   ALBUMIN 4.6 03/18/2019   CALCIUM 9.4 03/18/2019   ANIONGAP 7 01/28/2016   Lab Results  Component Value Date   CHOL 167 03/18/2019   Lab Results  Component Value Date   HDL 33 (L) 03/18/2019   Lab Results  Component Value Date   LDLCALC 96 03/18/2019   Lab Results  Component Value Date   TRIG 190 (H) 03/18/2019   Lab Results  Component Value Date   CHOLHDL 5.1 (H) 03/18/2019   No results found for: HGBA1C

## 2020-04-18 ENCOUNTER — Telehealth: Payer: Self-pay | Admitting: Family Medicine

## 2020-05-18 ENCOUNTER — Telehealth: Payer: Self-pay

## 2020-05-18 NOTE — Telephone Encounter (Signed)
Patient contacted office stating that her BP was currently 177/101. Patient states that her BP has been fluctuating over the past few days and a couple times she has had some chest tightness. Patient states that she is currently in [place]. I advised patient to seek care at the nearest urgent care of emergency dept for a complete evaluation. Patient verbalized understanding.

## 2020-06-25 NOTE — Progress Notes (Signed)
Cardiology Office Note  Date: 06/26/2020   ID: Alexandra Jones, DOB [birth year], MRN [id number]  PCP:  Loman Brooklyn, FNP  Cardiologist:  No primary care provider on file. Electrophysiologist:  None   Chief Complaint: Follow-up palpitations  History of Present Illness: Alexandra Jones is a 24 y.o. female with a history of palpitations, anxiety disorder, GERD, obesity, bradycardia .  Last encounter with Dr. Bronson Ing 11/18/2019.  She was seen after request of her primary care provider for evaluation of palpitations.  Previously had bradycardia in the 40s to 50s at night.  History of obesity.  Had noticed irregularity with heartbeat over the prior 2 months and being awakened by palpitations.  Denies any leg swelling, orthopnea or PND.  A 3-week event monitor was ordered as well as an echocardiogram.  Labs were ordered including TSH, magnesium and basic metabolic panel.  Cardiac monitor showed predominantly sinus rhythm and episodes of sinus tachycardia.  Patient is here for 72-month follow-up.  She denies any further issues of significant palpitations other bradycardia.  States she has a new diagnosis of hypertension.  She is currently taking 7.5 mg of amlodipine daily.  Blood pressure today is 118/84.  Otherwise she is feeling well denies any anginal or exertional symptoms, orthostatic symptoms, PND, orthopnea, CVA or TIA-like symptoms, bleeding issues, lower extremity edema, claudication.  She states she knows she is significantly overweight and is attempting to lose weight.  States she usually walks about 2 miles a day during the week without any issues.  Past Medical History:  Diagnosis Date  . Anxiety disorder 03/14/2019   Last Assessment & Plan:  Controlled with Lexapro 5 mg PO QD.  Marland Kitchen Diarrhea 05/01/2017  . GERD (gastroesophageal reflux disease)     Past Surgical History:  Procedure Laterality Date  . CHOLECYSTECTOMY    . COLONOSCOPY N/A 06/22/2017   Procedure: COLONOSCOPY;   Surgeon: Danie Binder, MD;  [facility]: [facility];  Service: Endoscopy;  Laterality: N/A;  1:45pm  . wisdom teeth extraction      Current Outpatient Medications  Medication Sig Dispense Refill  . amLODipine (NORVASC) 5 MG tablet Take 7.5 mg by mouth daily.    . Cetirizine HCl (ZYRTEC PO) Take by mouth as needed.     Marland Kitchen escitalopram (LEXAPRO) 5 MG tablet Take 1.5 tablets (7.5 mg total) by mouth daily. 135 tablet 3  . famotidine (PEPCID) 20 MG tablet Take 20 mg by mouth as needed.     Marland Kitchen ibuprofen (ADVIL,MOTRIN) 200 MG tablet Take 400 mg daily as needed by mouth for headache or moderate pain.    . Levonorgestrel (LILETTA, 52 MG,) 19.5 MCG/DAY IUD IUD 1 each by Intrauterine route once.    . Multiple Vitamin (MULTI-VITAMINS) TABS Take 1 tablet by mouth daily.     . Probiotic CAPS Take 1 capsule daily by mouth.     No current facility-administered medications for this visit.   Allergies:  Hydrocodone and Penicillins   Social History: The patient  reports that she has never smoked. She has never used smokeless tobacco. She reports current alcohol use. She reports that she does not use drugs.   Family History: The patient's family history includes Breast cancer (age of onset: 70) in her maternal grandmother; Colon cancer (age of onset: 36) in her maternal grandfather; Heart failure in her maternal grandfather; Hypertension in her maternal grandmother; Kidney failure in her maternal grandmother; Prostate cancer in her father and maternal grandfather.   ROS:  Please see the history of present illness. Otherwise, complete review of systems is positive for none.  All other systems are reviewed and negative.   Physical Exam: VS:  BP 118/84   Pulse 87   Ht 5\' 5"  (1.651 m)   Wt 252 lb 3.2 oz (114.4 kg)   SpO2 96%   BMI 41.97 kg/m , BMI Body mass index is 41.97 kg/m.  Wt Readings from Last 3 Encounters:  06/26/20 252 lb 3.2 oz (114.4 kg)  04/12/20 259 lb 3.2 oz (117.6 kg)  11/18/19 260 lb  (117.9 kg)    General: Morbidly obese patient appears comfortable at rest. Neck: Supple, no elevated JVP or carotid bruits, no thyromegaly. Lungs: Clear to auscultation, nonlabored breathing at rest. Cardiac: Regular rate and rhythm, no S3 or significant systolic murmur, no pericardial rub. Extremities: No pitting edema, distal pulses 2+. Skin: Warm and dry. Musculoskeletal: No kyphosis. Neuropsychiatric: Alert and oriented x3, affect grossly appropriate.  ECG:  EKG 11/18/2019 normal sinus rhythm rate of 88.  Recent Labwork: No results found for requested labs within last 8760 hours.     Component Value Date/Time   CHOL 167 03/18/2019 1056   TRIG 190 (H) 03/18/2019 1056   HDL 33 (L) 03/18/2019 1056   CHOLHDL 5.1 (H) 03/18/2019 1056   LDLCALC 96 03/18/2019 1056    Other Studies Reviewed Today:  Cardiac monitor 12/20/2019 Study Highlights   Predominantly sinus rhythm and episodes of sinus tachycardia seen.    Echocardiogram 4/28/ 2021 1. Left ventricular ejection fraction, by estimation, is 60 to 65%. The left ventricle has normal function. The left ventricle has no regional wall motion abnormalities. Left ventricular diastolic parameters were normal. 2. Right ventricular systolic function is normal. The right ventricular size is normal. Tricuspid regurgitation signal is inadequate for assessing PA pressure. 3. Left atrial size was upper normal. 4. The mitral valve is grossly normal. Trivial mitral valve regurgitation. 5. The aortic valve is tricuspid. Aortic valve regurgitation is not visualized. 6. The inferior vena cava is normal in size with greater than 50% respiratory variability, suggesting right atrial pressure of 3 mmHg.  Assessment and Plan:  1. Palpitations   2. Bradycardia   3. Essential hypertension   4. Morbid obesity ([place])    1. Palpitations Patient states since last visit her palpitations have significantly decreased and only has occasional issues with  palpitations.  2. Bradycardia Heart rate today is 87.  3. Essential hypertension She has a new diagnosis of hypertension.  Initially on amlodipine 5 mg which was subsequently increased to 7.5 mg daily.  4. Morbid obesity ([facility]) BMI 41.97.  Patient states she is attempting to walk daily and exercise more.  States she usually walks about 2 miles per day.  She knows she needs to lose weight.  She is currently in college and will be graduating soon.  She hopes to increase her exercise regimen once she is finished with college.  Medication Adjustments/Labs and Tests Ordered: Current medicines are reviewed at length with the patient today.  Concerns regarding medicines are outlined above.   Disposition: Follow-up with Dr. Domenic Polite or APP as needed  Signed, Levell July, NP 06/26/2020 10:52 AM    [facility] [place], [place], [place] [address] Phone: [phone]; Fax: [phone]

## 2020-06-26 ENCOUNTER — Ambulatory Visit (INDEPENDENT_AMBULATORY_CARE_PROVIDER_SITE_OTHER): Payer: PRIVATE HEALTH INSURANCE | Admitting: Family Medicine

## 2020-06-26 ENCOUNTER — Encounter: Payer: Self-pay | Admitting: Family Medicine

## 2020-06-26 VITALS — BP 118/84 | HR 87 | Ht 65.0 in | Wt 252.2 lb

## 2020-06-26 DIAGNOSIS — R001 Bradycardia, unspecified: Secondary | ICD-10-CM

## 2020-06-26 DIAGNOSIS — I10 Essential (primary) hypertension: Secondary | ICD-10-CM | POA: Diagnosis not present

## 2020-06-26 DIAGNOSIS — R002 Palpitations: Secondary | ICD-10-CM

## 2020-06-26 NOTE — Patient Instructions (Signed)
Medication Instructions:  Continue all current medications.  Labwork: none  Testing/Procedures: none  Follow-Up: As needed.    Any Other Special Instructions Will Be Listed Below (If Applicable).  If you need a refill on your cardiac medications before your next appointment, please call your pharmacy.  

## 2020-07-12 ENCOUNTER — Ambulatory Visit: Payer: PRIVATE HEALTH INSURANCE | Attending: Internal Medicine

## 2020-07-12 ENCOUNTER — Telehealth: Payer: PRIVATE HEALTH INSURANCE | Admitting: Family

## 2020-07-12 DIAGNOSIS — N390 Urinary tract infection, site not specified: Secondary | ICD-10-CM

## 2020-07-12 DIAGNOSIS — Z23 Encounter for immunization: Secondary | ICD-10-CM

## 2020-07-12 MED ORDER — SULFAMETHOXAZOLE-TRIMETHOPRIM 800-160 MG PO TABS: 1.0000 | ORAL_TABLET | Freq: Two times a day (BID) | ORAL | 0 refills | Status: DC

## 2020-07-12 NOTE — Progress Notes (Signed)
We are sorry that you are not feeling well.  Here is how we plan to help!  Based on what you shared with me it looks like you most likely have a simple urinary tract infection.  A UTI (Urinary Tract Infection) is a bacterial infection of the bladder.  Most cases of urinary tract infections are simple to treat but a key part of your care is to encourage you to drink plenty of fluids and watch your symptoms carefully.  I have prescribed Bactrim DS One tablet twice a day for 5 days.  Your symptoms should gradually improve. Call us if the burning in your urine worsens, you develop worsening fever, back pain or pelvic pain or if your symptoms do not resolve after completing the antibiotic.  Urinary tract infections can be prevented by drinking plenty of water to keep your body hydrated.  Also be sure when you wipe, wipe from front to back and don't hold it in!  If possible, empty your bladder every 4 hours.  Your e-visit answers were reviewed by a board certified advanced clinical practitioner to complete your personal care plan.  Depending on the condition, your plan could have included both over the counter or prescription medications.  If there is a problem please reply  once you have received a response from your provider.  Your safety is important to [place].  If you have drug allergies check your prescription carefully.    You can use MyChart to ask questions about today's visit, request a non-urgent call back, or ask for a work or school excuse for 24 hours related to this e-Visit. If it has been greater than 24 hours you will need to follow up with your provider, or enter a new e-Visit to address those concerns.   You will get an e-mail in the next two days asking about your experience.  I hope that your e-visit has been valuable and will speed your recovery. Thank you for using e-visits.  Greater than 5 minutes, yet less than 10 minutes of time have been spent researching, coordinating, and  implementing care for this patient.

## 2020-07-12 NOTE — Progress Notes (Signed)
   Covid-19 Vaccination Clinic  Name:  DEVIN FOSKEY    MRN: [id number] DOB: 1995/10/25  07/12/2020  Ms. Littles was observed post Covid-19 immunization for 15 minutes without incident. She was provided with Vaccine Information Sheet and instruction to access the V-Safe system.   Ms. Schreiter was instructed to call 911 with any severe reactions post vaccine: Marland Kitchen Difficulty breathing  . Swelling of face and throat  . A fast heartbeat  . A bad rash all over body  . Dizziness and weakness   Immunizations Administered    No immunizations on file.

## 2020-07-16 DIAGNOSIS — B369 Superficial mycosis, unspecified: Secondary | ICD-10-CM | POA: Insufficient documentation

## 2020-07-16 DIAGNOSIS — H6241 Otitis externa in other diseases classified elsewhere, right ear: Secondary | ICD-10-CM | POA: Insufficient documentation

## 2020-08-15 ENCOUNTER — Telehealth: Payer: Self-pay

## 2020-08-15 MED ORDER — AMLODIPINE BESYLATE 5 MG PO TABS: 7.5000 mg | ORAL_TABLET | Freq: Every day | ORAL | 0 refills | Status: DC

## 2020-08-15 NOTE — Telephone Encounter (Signed)
Rx sent & appointment scheduled

## 2020-08-15 NOTE — Telephone Encounter (Signed)
Go ahead and schedule her a follow-up but you can give her enough to get through that follow-up, even if it is a month or 2 worth.  But please make sure she has a follow-up appointment

## 2020-08-15 NOTE — Telephone Encounter (Signed)
  Prescription Request  08/15/2020  What is the name of the medication or equipment? amLODipine (NORVASC) 5 MG tablet   Have you contacted your pharmacy to request a refill? (if applicable) yes, pt was prescribed this at ER and was also told by Cardiologist she needs to stay on this and to request refill from PCP  Which pharmacy would you like this sent to? [facility]   Patient notified that their request is being sent to the clinical staff for review and that they should receive a response within 2 business days.

## 2020-08-15 NOTE — Telephone Encounter (Signed)
Covering PCP- please advise  

## 2020-08-23 ENCOUNTER — Ambulatory Visit (INDEPENDENT_AMBULATORY_CARE_PROVIDER_SITE_OTHER): Payer: 59 | Admitting: Advanced Practice Midwife

## 2020-08-23 ENCOUNTER — Other Ambulatory Visit: Payer: Self-pay

## 2020-08-23 ENCOUNTER — Encounter: Payer: Self-pay | Admitting: Advanced Practice Midwife

## 2020-08-23 VITALS — BP 138/89 | HR 101 | Ht 65.0 in | Wt 254.5 lb

## 2020-08-23 DIAGNOSIS — Z30432 Encounter for removal of intrauterine contraceptive device: Secondary | ICD-10-CM | POA: Diagnosis not present

## 2020-08-23 MED ORDER — PHEXXI 1.8-1-0.4 % VA GEL: 1.0000 | VAGINAL | 99 refills | Status: DC | PRN

## 2020-08-23 NOTE — Progress Notes (Signed)
  Sabina Timoteo Expose 25 y.o.  Vitals:   08/23/20 0850  BP: 138/89  Pulse: (!) 101   Past Medical History:  Diagnosis Date  . Anxiety disorder 03/14/2019   Last Assessment & Plan:  Controlled with Lexapro 5 mg PO QD.  Marland Kitchen Diarrhea 05/01/2017  . GERD (gastroesophageal reflux disease)    Past Surgical History:  Procedure Laterality Date  . CHOLECYSTECTOMY    . COLONOSCOPY N/A 06/22/2017   Procedure: COLONOSCOPY;  Surgeon: Danie Binder, MD;  Location: [facility];  Service: Endoscopy;  Laterality: N/A;  1:45pm  . wisdom teeth extraction     family history includes Breast cancer (age of onset: 75) in her maternal grandmother; Colon cancer (age of onset: 8) in her maternal grandfather; Heart failure in her maternal grandfather; Hypertension in her maternal grandmother; Kidney failure in her maternal grandmother; Prostate cancer in her father and maternal grandfather.  Current Outpatient Medications:  .  amLODipine (NORVASC) 5 MG tablet, Take 1.5 tablets (7.5 mg total) by mouth daily., Disp: 45 tablet, Rfl: 0 .  Cetirizine HCl (ZYRTEC PO), Take by mouth as needed. , Disp: , Rfl:  .  escitalopram (LEXAPRO) 5 MG tablet, Take 1.5 tablets (7.5 mg total) by mouth daily., Disp: 135 tablet, Rfl: 3 .  famotidine (PEPCID) 20 MG tablet, Take 20 mg by mouth as needed. , Disp: , Rfl:  .  ibuprofen (ADVIL,MOTRIN) 200 MG tablet, Take 400 mg daily as needed by mouth for headache or moderate pain., Disp: , Rfl:  .  Multiple Vitamin (MULTI-VITAMINS) TABS, Take 1 tablet by mouth., Disp: , Rfl:  .  Probiotic CAPS, Take 1 capsule by mouth., Disp: , Rfl:  .  Lactic Ac-Citric Ac-Pot Bitart (PHEXXI) 1.8-1-0.4 % GEL, Place 1 applicator vaginally as needed (immediately before intercourse--works for 1 hour)., Disp: 5 g, Rfl: prn    Here for IUD removal.  She had the [place] IUD placed 6 years ago and would like it removed .Marland KitchenWould like non hormonal option:  Phexxi, OTC spermicide, condoms. Risks/benefits/side effects  of each discussed.  Pt chooses phexxi/condoms.  A graves speculum was placed, and the strings were not visible. Reached inside the cx and the strings were grasped with a curved Claiborne Billings and the IUD easily removed.  Pt given IUD removal f/u instructions.

## 2020-09-08 ENCOUNTER — Other Ambulatory Visit: Payer: Self-pay | Admitting: Family Medicine

## 2020-09-11 ENCOUNTER — Ambulatory Visit: Payer: PRIVATE HEALTH INSURANCE | Admitting: Family Medicine

## 2020-09-21 ENCOUNTER — Ambulatory Visit (INDEPENDENT_AMBULATORY_CARE_PROVIDER_SITE_OTHER): Payer: 59 | Admitting: Family Medicine

## 2020-09-21 ENCOUNTER — Encounter: Payer: Self-pay | Admitting: Family Medicine

## 2020-09-21 ENCOUNTER — Other Ambulatory Visit: Payer: Self-pay

## 2020-09-21 VITALS — BP 126/88 | HR 97 | Temp 98.8°F | Ht 65.0 in | Wt 255.0 lb

## 2020-09-21 DIAGNOSIS — I10 Essential (primary) hypertension: Secondary | ICD-10-CM

## 2020-09-21 DIAGNOSIS — M25561 Pain in right knee: Secondary | ICD-10-CM | POA: Diagnosis not present

## 2020-09-21 DIAGNOSIS — F411 Generalized anxiety disorder: Secondary | ICD-10-CM | POA: Diagnosis not present

## 2020-09-21 MED ORDER — PREDNISONE 10 MG (21) PO TBPK: ORAL_TABLET | ORAL | 0 refills | Status: DC

## 2020-09-21 MED ORDER — AMLODIPINE BESYLATE 5 MG PO TABS
ORAL_TABLET | ORAL | 0 refills | Status: DC
  Filled 2021-03-11: qty 45, 30d supply, fill #0

## 2020-09-21 NOTE — Patient Instructions (Signed)
Knee Exercises Ask your health care provider which exercises are safe for you. Do exercises exactly as told by your health care provider and adjust them as directed. It is normal to feel mild stretching, pulling, tightness, or discomfort as you do these exercises. Stop right away if you feel sudden pain or your pain gets worse. Do not begin these exercises until told by your health care provider. Stretching and range-of-motion exercises These exercises warm up your muscles and joints and improve the movement and flexibility of your knee. These exercises also help to relieve pain and swelling. Knee extension, prone 1. Lie on your abdomen (prone position) on a bed. 2. Place your left / right knee just beyond the edge of the surface so your knee is not on the bed. You can put a towel under your left / right thigh just above your kneecap for comfort. 3. Relax your leg muscles and allow gravity to straighten your knee (extension). You should feel a stretch behind your left / right knee. 4. Hold this position for __________ seconds. 5. Scoot up so your knee is supported between repetitions. Repeat __________ times. Complete this exercise __________ times a day. Knee flexion, active 1. Lie on your back with both legs straight. If this causes back discomfort, bend your left / right knee so your foot is flat on the floor. 2. Slowly slide your left / right heel back toward your buttocks. Stop when you feel a gentle stretch in the front of your knee or thigh (flexion). 3. Hold this position for __________ seconds. 4. Slowly slide your left / right heel back to the starting position. Repeat __________ times. Complete this exercise __________ times a day.   Quadriceps stretch, prone 1. Lie on your abdomen on a firm surface, such as a bed or padded floor. 2. Bend your left / right knee and hold your ankle. If you cannot reach your ankle or pant leg, loop a belt around your foot and grab the belt  instead. 3. Gently pull your heel toward your buttocks. Your knee should not slide out to the side. You should feel a stretch in the front of your thigh and knee (quadriceps). 4. Hold this position for __________ seconds. Repeat __________ times. Complete this exercise __________ times a day.   Hamstring, supine 1. Lie on your back (supine position). 2. Loop a belt or towel over the ball of your left / right foot. The ball of your foot is on the walking surface, right under your toes. 3. Straighten your left / right knee and slowly pull on the belt to raise your leg until you feel a gentle stretch behind your knee (hamstring). ? Do not let your knee bend while you do this. ? Keep your other leg flat on the floor. 4. Hold this position for __________ seconds. Repeat __________ times. Complete this exercise __________ times a day. Strengthening exercises These exercises build strength and endurance in your knee. Endurance is the ability to use your muscles for a long time, even after they get tired. Quadriceps, isometric This exercise stretches the muscles in front of your thigh (quadriceps) without moving your knee joint (isometric). 1. Lie on your back with your left / right leg extended and your other knee bent. Put a rolled towel or small pillow under your knee if told by your health care provider. 2. Slowly tense the muscles in the front of your left / right thigh. You should see your kneecap slide up toward your  hip or see increased dimpling just above the knee. This motion will push the back of the knee toward the floor. 3. For __________ seconds, hold the muscle as tight as you can without increasing your pain. 4. Relax the muscles slowly and completely. Repeat __________ times. Complete this exercise __________ times a day.   Straight leg raises This exercise stretches the muscles in front of your thigh (quadriceps) and the muscles that move your hips (hip flexors). 1. Lie on your back  with your left / right leg extended and your other knee bent. 2. Tense the muscles in the front of your left / right thigh. You should see your kneecap slide up or see increased dimpling just above the knee. Your thigh may even shake a bit. 3. Keep these muscles tight as you raise your leg 4-6 inches (10-15 cm) off the floor. Do not let your knee bend. 4. Hold this position for __________ seconds. 5. Keep these muscles tense as you lower your leg. 6. Relax your muscles slowly and completely after each repetition. Repeat __________ times. Complete this exercise __________ times a day. Hamstring, isometric 1. Lie on your back on a firm surface. 2. Bend your left / right knee about __________ degrees. 3. Dig your left / right heel into the surface as if you are trying to pull it toward your buttocks. Tighten the muscles in the back of your thighs (hamstring) to "dig" as hard as you can without increasing any pain. 4. Hold this position for __________ seconds. 5. Release the tension gradually and allow your muscles to relax completely for __________ seconds after each repetition. Repeat __________ times. Complete this exercise __________ times a day. Hamstring curls If told by your health care provider, do this exercise while wearing ankle weights. Begin with __________ lb weights. Then increase the weight by 1 lb (0.5 kg) increments. Do not wear ankle weights that are more than __________ lb. 1. Lie on your abdomen with your legs straight. 2. Bend your left / right knee as far as you can without feeling pain. Keep your hips flat against the floor. 3. Hold this position for __________ seconds. 4. Slowly lower your leg to the starting position. Repeat __________ times. Complete this exercise __________ times a day.   Squats This exercise strengthens the muscles in front of your thigh and knee (quadriceps). 1. Stand in front of a table, with your feet and knees pointing straight ahead. You may rest  your hands on the table for balance but not for support. 2. Slowly bend your knees and lower your hips like you are going to sit in a chair. ? Keep your weight over your heels, not over your toes. ? Keep your lower legs upright so they are parallel with the table legs. ? Do not let your hips go lower than your knees. ? Do not bend lower than told by your health care provider. ? If your knee pain increases, do not bend as low. 3. Hold the squat position for __________ seconds. 4. Slowly push with your legs to return to standing. Do not use your hands to pull yourself to standing. Repeat __________ times. Complete this exercise __________ times a day. Wall slides This exercise strengthens the muscles in front of your thigh and knee (quadriceps). 1. Lean your back against a smooth wall or door, and walk your feet out 18-24 inches (46-61 cm) from it. 2. Place your feet hip-width apart. 3. Slowly slide down the wall or door  until your knees bend __________ degrees. Keep your knees over your heels, not over your toes. Keep your knees in line with your hips. 4. Hold this position for __________ seconds. Repeat __________ times. Complete this exercise __________ times a day.   Straight leg raises This exercise strengthens the muscles that rotate the leg at the hip and move it away from your body (hip abductors). 1. Lie on your side with your left / right leg in the top position. Lie so your head, shoulder, knee, and hip line up. You may bend your bottom knee to help you keep your balance. 2. Roll your hips slightly forward so your hips are stacked directly over each other and your left / right knee is facing forward. 3. Leading with your heel, lift your top leg 4-6 inches (10-15 cm). You should feel the muscles in your outer hip lifting. ? Do not let your foot drift forward. ? Do not let your knee roll toward the ceiling. 4. Hold this position for __________ seconds. 5. Slowly return your leg to the  starting position. 6. Let your muscles relax completely after each repetition. Repeat __________ times. Complete this exercise __________ times a day.   Straight leg raises This exercise stretches the muscles that move your hips away from the front of the pelvis (hip extensors). 1. Lie on your abdomen on a firm surface. You can put a pillow under your hips if that is more comfortable. 2. Tense the muscles in your buttocks and lift your left / right leg about 4-6 inches (10-15 cm). Keep your knee straight as you lift your leg. 3. Hold this position for __________ seconds. 4. Slowly lower your leg to the starting position. 5. Let your leg relax completely after each repetition. Repeat __________ times. Complete this exercise __________ times a day. This information is not intended to replace advice given to you by your health care provider. Make sure you discuss any questions you have with your health care provider. Document Revised: 05/18/2018 Document Reviewed: 05/18/2018 Elsevier Patient Education  2021 [employer].

## 2020-09-21 NOTE — Progress Notes (Signed)
Assessment & Plan:  1. Essential hypertension Well controlled on current regimen.  - amLODipine (NORVASC) 5 MG tablet; TAKE 1.5 TABLETS BY MOUTH DAILY.  Dispense: 135 tablet; Refill: 1 - CBC with Differential/Platelet; Future - CMP14+EGFR; Future - Lipid panel; Future - TSH; Future  2. Generalized anxiety disorder Well controlled on current regimen.   3. Acute pain of right knee Exercises provided for patient to do at home. - predniSONE (STERAPRED UNI-PAK 21 TAB) 10 MG (21) TBPK tablet; As directed x 6 days  Dispense: 21 tablet; Refill: 0   Patient will return to complete lab work fasting.  Hendricks Limes, MSN, APRN, FNP-C [facility]  Subjective:    Patient ID: Alexandra Jones, female    DOB: 1996-07-14, 25 y.o.   MRN: [id number]  Patient Care Team: Loman Brooklyn, FNP as PCP - General (Family Medicine) Danie Binder, MD (Inactive) as Consulting Physician (Gastroenterology) Jonnie Kind, MD (Inactive) as Consulting Physician (Obstetrics and Gynecology)   Chief Complaint:  Chief Complaint  Patient presents with  . Hypertension    Check up of chronic medical conditions     HPI: Alexandra Jones is a 25 y.o. female presenting on 09/21/2020 for Hypertension (Check up of chronic medical conditions/)  Hypertension: Patient reports she was started on amlodipine in the ER.  She has had follow-up with cardiology.  She would like medication filled by me going forward.  Anxiety: Patient reports she was doing very well until this week.  She did not pass her OT boards and has been a little upset about that but.  GAD 7 : Generalized Anxiety Score 09/21/2020 04/12/2020 03/16/2019  Nervous, Anxious, on Edge '1 3 1  ' Control/stop worrying '2 3 1  ' Worry too much - different things '1 3 1  ' Trouble relaxing 0 3 1  Restless 0 2 0  Easily annoyed or irritable '1 1 1  ' Afraid - awful might happen 2 2 0  Total GAD 7 Score '7 17 5  ' Anxiety Difficulty Not difficult at  all - Not difficult at all   Depression screen [facility] 2/9 09/21/2020 04/12/2020 11/14/2019  Decreased Interest 1 0 0  Down, Depressed, Hopeless 1 0 0  PHQ - 2 Score 2 0 0  Altered sleeping 0 2 -  Tired, decreased energy 1 2 -  Change in appetite 2 2 -  Feeling bad or failure about yourself  1 1 -  Trouble concentrating 0 0 -  Moving slowly or fidgety/restless 0 1 -  Suicidal thoughts 0 0 -  PHQ-9 Score 6 8 -  Difficult doing work/chores Not difficult at all - -    New complaints: Patient reports right knee pain just below her kneecap that has been present x2 weeks.  She reports it does not hurt to walk, only when she puts pressure on that area.  Social history:  Relevant past medical, surgical, family and social history reviewed and updated as indicated. Interim medical history since our last visit reviewed.  Allergies and medications reviewed and updated.  DATA REVIEWED: CHART IN EPIC  ROS: Negative unless specifically indicated above in HPI.    Current Outpatient Medications:  .  amLODipine (NORVASC) 5 MG tablet, TAKE 1.5 TABLETS BY MOUTH DAILY., Disp: 45 tablet, Rfl: 0 .  Cetirizine HCl (ZYRTEC PO), Take by mouth as needed. , Disp: , Rfl:  .  escitalopram (LEXAPRO) 5 MG tablet, Take 1.5 tablets (7.5 mg total) by mouth daily., Disp: 135 tablet,  Rfl: 3 .  famotidine (PEPCID) 20 MG tablet, Take 20 mg by mouth as needed. , Disp: , Rfl:  .  ibuprofen (ADVIL,MOTRIN) 200 MG tablet, Take 400 mg daily as needed by mouth for headache or moderate pain., Disp: , Rfl:  .  Lactic Ac-Citric Ac-Pot Bitart (PHEXXI) 1.8-1-0.4 % GEL, Place 1 applicator vaginally as needed (immediately before intercourse--works for 1 hour)., Disp: 5 g, Rfl: prn .  Multiple Vitamin (MULTI-VITAMINS) TABS, Take 1 tablet by mouth., Disp: , Rfl:  .  Probiotic CAPS, Take 1 capsule by mouth., Disp: , Rfl:    Allergies  Allergen Reactions  . Hydrocodone Hives  . Penicillins Rash    fever Has patient had a PCN reaction  causing immediate rash, facial/tongue/throat swelling, SOB or lightheadedness with hypotension: Yes Has patient had a PCN reaction causing severe rash involving mucus membranes or skin necrosis: No Has patient had a PCN reaction that required hospitalization: No Has patient had a PCN reaction occurring within the last 10 years: Yes If all of the above answers are "NO", then may proceed with Cephalosporin use.    Past Medical History:  Diagnosis Date  . Anxiety disorder 03/14/2019   Last Assessment & Plan:  Controlled with Lexapro 5 mg PO QD.  Marland Kitchen Diarrhea 05/01/2017  . GERD (gastroesophageal reflux disease)     Past Surgical History:  Procedure Laterality Date  . CHOLECYSTECTOMY    . COLONOSCOPY N/A 06/22/2017   Procedure: COLONOSCOPY;  Surgeon: Danie Binder, MD;  Location: [facility];  Service: Endoscopy;  Laterality: N/A;  1:45pm  . wisdom teeth extraction      Social History   Socioeconomic History  . Marital status: Single    Spouse name: Not on file  . Number of children: Not on file  . Years of education: Not on file  . Highest education level: Not on file  Occupational History  . Not on file  Tobacco Use  . Smoking status: Never Smoker  . Smokeless tobacco: Never Used  Vaping Use  . Vaping Use: Never used  Substance and Sexual Activity  . Alcohol use: Yes    Alcohol/week: 0.0 standard drinks    Comment: occasional  . Drug use: No  . Sexual activity: Yes    Birth control/protection: I.U.D.  Other Topics Concern  . Not on file  Social History Narrative  . Not on file   Social Determinants of Health   Financial Resource Strain: Not on file  Food Insecurity: Not on file  Transportation Needs: Not on file  Physical Activity: Not on file  Stress: Not on file  Social Connections: Not on file  Intimate Partner Violence: Not on file        Objective:    BP 126/88   Pulse 97   Temp 98.8 F (37.1 C) (Temporal)   Ht '5\' 5"'  (1.651 m)   Wt 255 lb (115.7 kg)    SpO2 98%   BMI 42.43 kg/m   Wt Readings from Last 3 Encounters:  09/21/20 255 lb (115.7 kg)  08/23/20 254 lb 8 oz (115.4 kg)  06/26/20 252 lb 3.2 oz (114.4 kg)    Physical Exam Vitals reviewed.  Constitutional:      General: She is not in acute distress.    Appearance: Normal appearance. She is not ill-appearing, toxic-appearing or diaphoretic.  HENT:     Head: Normocephalic and atraumatic.  Eyes:     General: No scleral icterus.  Right eye: No discharge.        Left eye: No discharge.     Conjunctiva/sclera: Conjunctivae normal.  Cardiovascular:     Rate and Rhythm: Normal rate and regular rhythm.     Heart sounds: Normal heart sounds. No murmur heard. No friction rub. No gallop.   Pulmonary:     Effort: Pulmonary effort is normal. No respiratory distress.     Breath sounds: Normal breath sounds. No stridor. No wheezing, rhonchi or rales.  Musculoskeletal:        General: Normal range of motion.     Cervical back: Normal range of motion.     Right knee: No swelling, deformity, effusion, erythema, ecchymosis, lacerations, bony tenderness or crepitus. Normal range of motion. Tenderness present over the patellar tendon. No LCL laxity, MCL laxity, ACL laxity or PCL laxity. Normal alignment, normal meniscus and normal patellar mobility. Normal pulse.  Skin:    General: Skin is warm and dry.     Capillary Refill: Capillary refill takes less than 2 seconds.  Neurological:     General: No focal deficit present.     Mental Status: She is alert and oriented to person, place, and time. Mental status is at baseline.  Psychiatric:        Mood and Affect: Mood normal.        Behavior: Behavior normal.        Thought Content: Thought content normal.        Judgment: Judgment normal.     No results found for: TSH Lab Results  Component Value Date   WBC 5.0 01/28/2016   HGB 13.6 01/28/2016   HCT 39.8 01/28/2016   MCV 87.7 01/28/2016   PLT 132 (L) 01/28/2016   Lab  Results  Component Value Date   NA 136 03/18/2019   K 4.4 03/18/2019   CO2 22 03/18/2019   GLUCOSE 90 03/18/2019   BUN 9 03/18/2019   CREATININE 0.84 03/18/2019   BILITOT 0.5 03/18/2019   ALKPHOS 57 03/18/2019   AST 24 03/18/2019   ALT 26 03/18/2019   PROT 7.1 03/18/2019   ALBUMIN 4.6 03/18/2019   CALCIUM 9.4 03/18/2019   ANIONGAP 7 01/28/2016   Lab Results  Component Value Date   CHOL 167 03/18/2019   Lab Results  Component Value Date   HDL 33 (L) 03/18/2019   Lab Results  Component Value Date   LDLCALC 96 03/18/2019   Lab Results  Component Value Date   TRIG 190 (H) 03/18/2019   Lab Results  Component Value Date   CHOLHDL 5.1 (H) 03/18/2019   No results found for: HGBA1C

## 2020-10-01 ENCOUNTER — Ambulatory Visit (INDEPENDENT_AMBULATORY_CARE_PROVIDER_SITE_OTHER): Payer: 59 | Admitting: Bariatrics

## 2020-10-01 ENCOUNTER — Encounter (INDEPENDENT_AMBULATORY_CARE_PROVIDER_SITE_OTHER): Payer: Self-pay | Admitting: Bariatrics

## 2020-10-01 ENCOUNTER — Other Ambulatory Visit: Payer: Self-pay

## 2020-10-01 VITALS — BP 133/86 | HR 79 | Temp 98.1°F | Ht 65.0 in | Wt 250.0 lb

## 2020-10-01 DIAGNOSIS — R0602 Shortness of breath: Secondary | ICD-10-CM

## 2020-10-01 DIAGNOSIS — Z6841 Body Mass Index (BMI) 40.0 and over, adult: Secondary | ICD-10-CM

## 2020-10-01 DIAGNOSIS — Z9189 Other specified personal risk factors, not elsewhere classified: Secondary | ICD-10-CM | POA: Diagnosis not present

## 2020-10-01 DIAGNOSIS — I10 Essential (primary) hypertension: Secondary | ICD-10-CM

## 2020-10-01 DIAGNOSIS — R7309 Other abnormal glucose: Secondary | ICD-10-CM

## 2020-10-01 DIAGNOSIS — Z1331 Encounter for screening for depression: Secondary | ICD-10-CM | POA: Diagnosis not present

## 2020-10-01 DIAGNOSIS — E781 Pure hyperglyceridemia: Secondary | ICD-10-CM

## 2020-10-01 DIAGNOSIS — E559 Vitamin D deficiency, unspecified: Secondary | ICD-10-CM | POA: Diagnosis not present

## 2020-10-01 DIAGNOSIS — F5081 Binge eating disorder: Secondary | ICD-10-CM

## 2020-10-01 DIAGNOSIS — K219 Gastro-esophageal reflux disease without esophagitis: Secondary | ICD-10-CM

## 2020-10-01 DIAGNOSIS — R5383 Other fatigue: Secondary | ICD-10-CM | POA: Diagnosis not present

## 2020-10-01 DIAGNOSIS — Z0289 Encounter for other administrative examinations: Secondary | ICD-10-CM

## 2020-10-01 NOTE — Progress Notes (Signed)
Office: [phone]  /  Fax: [phone]    Date: October 08, 2020   Appointment Start Time: 9:07am Duration: 48 minutes Provider: Glennie Isle, Psy.D. Type of Session: Intake for Individual Therapy  Location of Patient: Home Location of Provider: Provider's Home (private office) Type of Contact: Telepsychological Visit via MyChart Video Visit  Informed Consent: This provider called Alexandra Jones at 9:02am as she did not present for the telepsychological appointment. Assistance was provided on how to connect. As such, today's appointment was initiated 7 minutes late. Prior to proceeding with today's appointment, two pieces of identifying information were obtained. In addition, Alexandra Jones's physical location at the time of this appointment was obtained as well a phone number she could be reached at in the event of technical difficulties. Alexandra Jones and this provider participated in today's telepsychological service.   The provider's role was explained to [employer]. The provider reviewed and discussed issues of confidentiality, privacy, and limits therein (e.g., reporting obligations). In addition to verbal informed consent, written informed consent for psychological services was obtained prior to the initial appointment. Since the clinic is not a 24/7 crisis center, mental health emergency resources were shared and this  provider explained MyChart, e-mail, voicemail, and/or other messaging systems should be utilized only for non-emergency reasons. This provider also explained that information obtained during appointments will be placed in [facility] record and relevant information will be shared with other providers at [facility] for coordination of care. Alexandra Jones agreed information may be shared with other [facility] providers as needed for coordination of care and by signing the service agreement document, she provided written consent for coordination of care. Prior to  initiating telepsychological services, Alexandra Jones completed an informed consent document, which included the development of a safety plan (i.e., an emergency contact and emergency resources) in the event of an emergency/crisis. Truth expressed understanding of the rationale of the safety plan. Alexandra Jones verbally acknowledged understanding she is ultimately responsible for understanding her insurance benefits for telepsychological and in-person services. This provider also reviewed confidentiality, as it relates to telepsychological services, as well as the rationale for telepsychological services (i.e., to reduce exposure risk to COVID-19). Alexandra Jones  acknowledged understanding that appointments cannot be recorded without both party consent and she is aware she is responsible for securing confidentiality on her end of the session. Alexandra Jones verbally consented to proceed.  Chief Complaint/HPI: Alexandra Jones was referred by Dr. Jearld Jones due to Binge Eating Disorder. Per the note for the initial visit with Dr. Jearld Jones on October 01, 2020, "Alexandra Jones reports some binge eating behaviors." The note for the initial appointment with Dr. Jearld Jones on October 01, 2020 indicated the following: "Alexandra Jones's habits were reviewed today and are as follows: Her family eats meals together, she thinks her family will eat healthier with her, she struggles with family and or coworkers weight loss sabotage, her desired weight loss is 100 lbs, she has been heavy most of her life, she started gaining weight in 2013 when she started college, her heaviest weight ever was 260 pounds, she has significant food cravings issues, she snacks frequently in the evenings, she is frequently drinking liquids with calories, she frequently makes poor food choices, she frequently eats larger portions than normal, she has binge eating behaviors and she struggles with emotional eating." Alexandra Jones's Food and Mood (modified PHQ-9) score on October 01, 2020 was 18.  During today's  appointment, Alexandra Jones was verbally administered a questionnaire assessing various behaviors related to emotional eating.  Alexandra Jones endorsed the following: overeat when you are celebrating, experience food cravings on a regular basis, eat certain foods when you are anxious, stressed, depressed, or your feelings are hurt, use food to help you cope with emotional situations, find food is comforting to you, overeat when you are angry or upset, overeat when you are worried about something, overeat frequently when you are bored or lonely, overeat when you are alone, but eat much less when you are with other people and eat as a reward.Alexandra Jones described enjoying food and she can "eat anything." She noted an increase in emotional eating behaviors when experiencing the following emotions: stress, anxious, sad, grief, and happy/celebration. When experiencing any of the aforementioned emotions, she discussed eating what is available to her. She added she feels she does not have a "sensor" for when she is full. She described the frequency of emotional eating behaviors as "almost daily." Alexandra Jones also reported she will order more than she needs when going out to eat, noting the frequency as "once or twice a week." She stated "negative self-talk" or someone pointing out what she is doing (e.g., her parents) stops her from engaging in the behavior. Aren explained she first began engaging in binge eating and emotional eating behaviors approximately two years ago. When engaging in binge eating behaviors, she noted she feels out of control, eats more rapidly than normal, and eats when she is not hungry. Alexandra Jones denied a history of restricting food intake, purging and engagement in other compensatory strategies for weight loss , and has never been diagnosed with an eating disorder. Furthermore, Alexandra Jones endorsed she is diagnosed with anxiety and was prescribed Lexapro by her PCP. She believes the Lexapro has helped reduce panic attacks, but still  feels she is "overly anxious."   Mental Status Examination:  Appearance: well groomed and appropriate hygiene  Behavior: appropriate to circumstances Mood: anxious Affect: mood congruent Speech: normal in rate, volume, and tone Eye Contact: appropriate Psychomotor Activity: unable to assess  Gait: unable to assess Thought Process: linear, logical, and goal directed  Thought Content/Perception: denies suicidal and homicidal ideation, plan, and intent and no hallucinations, delusions, bizarre thinking or behavior reported or observed Orientation: time, person, place, and purpose of appointment Memory/Concentration: memory, attention, language, and fund of knowledge intact  Insight/Judgment: fair  Family & Psychosocial History: Chayil reported she is in a relationship and does not have any children. She indicated she is currently not working. Additionally, Joelie shared her highest level of education obtained is a master's degree. Currently, Antonya's social support system consists of her parents, boyfriend, and maternal grandfather. Moreover, Breasia stated she resides with her parents.   Medical History:  Past Medical History:  Diagnosis Date  . Anxiety disorder 03/14/2019   Last Assessment & Plan:  Controlled with Lexapro 5 mg PO QD.  Marland Kitchen Diarrhea 05/01/2017  . GERD (gastroesophageal reflux disease)   . High blood pressure   . Lactose intolerance    Past Surgical History:  Procedure Laterality Date  . CHOLECYSTECTOMY    . COLONOSCOPY N/A 06/22/2017   Procedure: COLONOSCOPY;  Surgeon: Danie Binder, MD;  Location: [facility];  Service: Endoscopy;  Laterality: N/A;  1:45pm  . wisdom teeth extraction     Current Outpatient Medications on File Prior to Visit  Medication Sig Dispense Refill  . acetaminophen (TYLENOL) 500 MG tablet Take 500 mg by mouth every 6 (six) hours as needed.    Marland Kitchen amLODipine (NORVASC) 5 MG tablet TAKE 1.5 TABLETS BY  MOUTH DAILY. 135 tablet 1  . Biotin 5 MG CAPS  Take by mouth.    . calcium carbonate (TUMS - DOSED IN MG ELEMENTAL CALCIUM) 500 MG chewable tablet Chew 1 tablet by mouth daily.    . Emollient (COLLAGEN EX) Apply topically. 1  scoop    . escitalopram (LEXAPRO) 5 MG tablet Take 1.5 tablets (7.5 mg total) by mouth daily. 135 tablet 3  . famotidine (PEPCID) 20 MG tablet Take 20 mg by mouth as needed.     Marland Kitchen ibuprofen (ADVIL) 400 MG tablet Take 400 mg by mouth every 6 (six) hours as needed.    . loratadine (CLARITIN) 10 MG tablet Take 10 mg by mouth daily.    . Multiple Vitamin (MULTI-VITAMINS) TABS Take 1 tablet by mouth.     No current facility-administered medications on file prior to visit.  Jacarra denied a history of head injuries and loss of consciousness.    Mental Health History: Yaritzi reported she has never attended therapeutic services. She shared she is prescribed Lexapro (as discussed above). Daijah reported there is no history of hospitalizations for psychiatric concerns. Meerab endorsed a family history of mental health related concerns. She noted her maternal uncle has a history of "drug addiction" and there is a history of depression for both sets of grandparents and father. Lynn reported there is no history of trauma including psychological, physical  and sexual abuse, as well as neglect.  Zaara described her typical mood lately as "pretty uptight" and "worrisome a lot." She disclosed experiencing "a lot of mood swings," which are characterized by feeling "normal" and then "getting irritated." She described the frequency of "mood swings" as "once or twice a day," but believes it is secondary to increased stress (graduated with master's degree and has to take exam this Friday to start practicing). Aside from concerns noted above and endorsed on the PHQ-9 and GAD-7, Tayli reported experiencing decreased motivation when she is "so worked up." She also noted a history of panic attacks characterized by feeling hot, shaking, tightness in chest,  throat feels like it is closing up, and feeling out of control. She shared she starts pacing and engaging in self-talk. Since starting Lexapro a year and a half ago, she experiences a panic attack once a month to every other month, adding the last one was "almost a month ago." Additionally, Madge described feeling stuck with current circumstances and discussed feeling as though she does not have coping skills. Brieana endorsed current alcohol use, noting it is in the form of one standard drink weekly. She denied tobacco use. She denied illicit/recreational substance use. Regarding caffeine intake, Teah reported consuming 1-3 cups of coffee daily depending on the day. Furthermore, Eirene indicated she is not experiencing the following: hallucinations and delusions, paranoia, symptoms of mania , social withdrawal and crying spells. She also denied history of and current suicidal ideation, plan, and intent; history of and current homicidal ideation, plan, and intent; and history of and current engagement in self-harm.  The following strengths were reported by Neriyah: "very hard headed (pushes thru tasks)" and "sweet." The following strengths were observed by this provider: ability to express thoughts and feelings during the therapeutic session, ability to establish and benefit from a therapeutic relationship, willingness to work toward established goal(s) with the clinic and ability to engage in reciprocal conversation.   Legal History: Teka reported there is no history of legal involvement.   Structured Assessments Results: The Patient Health Questionnaire-9 (PHQ-9) is a self-report  measure that assesses symptoms and severity of depression over the course of the last two weeks. Shanyiah obtained a score of 13 suggesting moderate depression. Xuan finds the endorsed symptoms to be somewhat difficult. [0= Not at all; 1= Several days; 2= More than half the days; 3= Nearly every day] Little interest or pleasure in  doing things 1  Feeling down, depressed, or hopeless 1  Trouble falling or staying asleep, or sleeping too much 2  Feeling tired or having little energy 3  Poor appetite or overeating 3  Feeling bad about yourself --- or that you are a failure or have let yourself or your family down 3  Trouble concentrating on things, such as reading the newspaper or watching television 0  Moving or speaking so slowly that other people could have noticed? Or the opposite --- being so fidgety or restless that you have been moving around a lot more than usual 0  Thoughts that you would be better off dead or hurting yourself in some way 0  PHQ-9 Score 13    The Generalized Anxiety Disorder-7 (GAD-7) is a brief self-report measure that assesses symptoms of anxiety over the course of the last two weeks. Deyna obtained a score of 13 suggesting moderate anxiety. Ardra finds the endorsed symptoms to be somewhat difficult. [0= Not at all; 1= Several days; 2= Over half the days; 3= Nearly every day] Feeling nervous, anxious, on edge 3  Not being able to stop or control worrying 3  Worrying too much about different things 2  Trouble relaxing 2  Being so restless that it's hard to sit still 0  Becoming easily annoyed or irritable 2  Feeling afraid as if something awful might happen 1  GAD-7 Score 13   Interventions:  Conducted a chart review Focused on rapport building Verbally administered PHQ-9 and GAD-7 for symptom monitoring Verbally administered Food & Mood questionnaire to assess various behaviors related to emotional eating Provided emphatic reflections and validation Employed motivational interviewing skills to assess patient's willingness/desire to adhere to recommended medical treatments and assignments Recommended/discussed option for longer-term therapeutic services Psychoeducation provided regarding pleasurable activities Psychoeducation provided regarding self-care  Provisional DSM-5  Diagnosis(es): 300.02 (F41.1) Generalized Anxiety Disorder and 307.59 (F50.8) Other Specified Feeding or Eating Disorder, Emotional and Binge Eating Behaviors   Plan: Lyndy appears able and willing to participate as evidenced by collaboration on a treatment goal, engagement in reciprocal conversation, and asking questions as needed for clarification. The next appointment will be scheduled in 1-2 weeks, which will be via MyChart Video Visit. The following treatment goal was established: increase coping skills. This provider will regularly review the treatment plan and medical chart to keep informed of status changes. Arilla expressed understanding and agreement with the initial treatment plan of care.    Psychoeducation regarding the importance of self-care. Psychoeducation regarding pleasurable activities, including its impact on emotional eating and overall well-being was provided. Barbarita was provided with a handout with various options of pleasurable activities, and was encouraged to engage in one activity a day and additional activities as needed when triggered to emotionally eat. Necie agreed. Myan provided verbal consent during today's appointment for this provider to send the handout via e-mail. Additionally, a handout with referrals was sent via e-mail with Asley's verbal consent.

## 2020-10-02 ENCOUNTER — Encounter (INDEPENDENT_AMBULATORY_CARE_PROVIDER_SITE_OTHER): Payer: Self-pay | Admitting: Bariatrics

## 2020-10-02 DIAGNOSIS — R7303 Prediabetes: Secondary | ICD-10-CM | POA: Insufficient documentation

## 2020-10-02 DIAGNOSIS — E559 Vitamin D deficiency, unspecified: Secondary | ICD-10-CM | POA: Insufficient documentation

## 2020-10-02 LAB — COMPREHENSIVE METABOLIC PANEL
ALT: 21 IU/L (ref 0–32)
AST: 20 IU/L (ref 0–40)
Albumin/Globulin Ratio: 1.7 (ref 1.2–2.2)
Albumin: 4.8 g/dL (ref 3.9–5.0)
Alkaline Phosphatase: 60 IU/L (ref 44–121)
BUN/Creatinine Ratio: 14 (ref 9–23)
BUN: 9 mg/dL (ref 6–20)
Bilirubin Total: 0.3 mg/dL (ref 0.0–1.2)
CO2: 21 mmol/L (ref 20–29)
Calcium: 9.6 mg/dL (ref 8.7–10.2)
Chloride: 104 mmol/L (ref 96–106)
Creatinine, Ser: 0.66 mg/dL (ref 0.57–1.00)
GFR calc Af Amer: 141 mL/min/{1.73_m2} (ref >59)
GFR calc non Af Amer: 122 mL/min/{1.73_m2} (ref >59)
Globulin, Total: 2.9 g/dL (ref 1.5–4.5)
Glucose: 89 mg/dL (ref 65–99)
Potassium: 4.2 mmol/L (ref 3.5–5.2)
Sodium: 143 mmol/L (ref 134–144)
Total Protein: 7.7 g/dL (ref 6.0–8.5)

## 2020-10-02 LAB — HEMOGLOBIN A1C
Est. average glucose Bld gHb Est-mCnc: 120 mg/dL
Hgb A1c MFr Bld: 5.8 % — ABNORMAL HIGH (ref 4.8–5.6)

## 2020-10-02 LAB — LIPID PANEL WITH LDL/HDL RATIO
Cholesterol, Total: 199 mg/dL (ref 100–199)
HDL: 42 mg/dL (ref >39)
LDL Chol Calc (NIH): 125 mg/dL — ABNORMAL HIGH (ref 0–99)
LDL/HDL Ratio: 3 ratio (ref 0.0–3.2)
Triglycerides: 182 mg/dL — ABNORMAL HIGH (ref 0–149)
VLDL Cholesterol Cal: 32 mg/dL (ref 5–40)

## 2020-10-02 LAB — T3: T3, Total: 140 ng/dL (ref 71–180)

## 2020-10-02 LAB — INSULIN, RANDOM: INSULIN: 14.7 u[IU]/mL (ref 2.6–24.9)

## 2020-10-02 LAB — T4, FREE: Free T4: 1.34 ng/dL (ref 0.82–1.77)

## 2020-10-02 LAB — VITAMIN D 25 HYDROXY (VIT D DEFICIENCY, FRACTURES): Vit D, 25-Hydroxy: 25 ng/mL — ABNORMAL LOW (ref 30.0–100.0)

## 2020-10-02 LAB — TSH: TSH: 1.89 u[IU]/mL (ref 0.450–4.500)

## 2020-10-02 NOTE — Progress Notes (Signed)
Chief Complaint:   Alexandra Jones (MR# [id number]) is a 25 y.o. female who presents for evaluation and treatment of Alexandra and related comorbidities. Current BMI is Body mass index is 41.6 kg/m. Alexandra Jones has been struggling with her weight for many years and has been unsuccessful in either losing weight, maintaining weight loss, or reaching her healthy weight goal.  Alexandra Jones is currently in the action stage of change and ready to dedicate time achieving and maintaining a healthier weight. Alexandra Jones is interested in becoming our patient and working on intensive lifestyle modifications including (but not limited to) diet and exercise for weight loss.  Alexandra Jones states that she is lactose intolerant. She states that she does like to cook, but notes time as an obstacle.  Alexandra Jones's habits were reviewed today and are as follows: Her family eats meals together, she thinks her family will eat healthier with her, she struggles with family and or coworkers weight loss sabotage, her desired weight loss is 100 lbs, she has been heavy most of her life, she started gaining weight in 2013 when she started college, her heaviest weight ever was 260 pounds, she has significant food cravings issues, she snacks frequently in the evenings, she is frequently drinking liquids with calories, she frequently makes poor food choices, she frequently eats larger portions than normal, she has binge eating behaviors and she struggles with emotional eating.  Depression Screen [name]'s Food and Mood (modified PHQ-9) score was 18.  Depression screen Alexandra Jones, Alexandra Jones 2/9 10/01/2020  Decreased Interest 3  Down, Depressed, Hopeless 2  PHQ - 2 Score 5  Altered sleeping 2  Tired, decreased energy 3  Change in appetite 3  Feeling bad or failure about yourself  3  Trouble concentrating 2  Moving slowly or fidgety/restless 0  Suicidal thoughts 0  PHQ-9 Score 18  Difficult doing work/chores Not difficult at all   Subjective:   1. Other  fatigue Alexandra Jones admits to daytime somnolence and admits to waking up still tired. Patent has a history of symptoms of daytime fatigue. Alexandra Jones generally gets 6 or 7 hours of sleep per night, and states that she has poor sleep quality. Snoring is present. Apneic episodes are not present. Epworth Sleepiness Score is 3.  2. SOB (shortness of breath) on exertion Alexandra Jones notes increasing shortness of breath with exercising and seems to be worsening over time with weight gain. She notes getting out of breath sooner with activity than she used to. This has gotten worse recently. [name] denies shortness of breath at rest or orthopnea.  3. Essential hypertension Alexandra Jones is taking amlodipine, and her BP is reasonably well controlled.  4. Vitamin D deficiency Alexandra Jones is taking a multi-vitamin daily.  5. Gastroesophageal reflux disease, unspecified whether esophagitis present Alexandra Jones is taking Pepcid and a probiotic.   6. Hypertriglyceridemia Alexandra Jones has a history of elevated triglycerides. She is not on statin therapy.  7. Elevated glucose Alexandra Jones has a history of elevated glucose.  8. Binge eating disorder Alexandra Jones reports some binge eating behaviors.  9. At risk for heart disease Alexandra Jones is at a higher than average risk for cardiovascular disease due to Alexandra and elevated triglycerides.   Assessment/Plan:   1. Other fatigue [name] does feel that her weight is causing her energy to be lower than it should be. Fatigue may be related to Alexandra, depression or many other causes. Labs will be ordered, and in the meanwhile, Alexandra Jones will focus on self care including making healthy food choices, increasing  physical activity and focusing on stress reduction.  - EKG 12-Lead - T3 - T4, free - TSH  2. SOB (shortness of breath) on exertion [name] does feel that she gets out of breath more easily that she used to when she exercises. [name]'s shortness of breath appears to be Alexandra related and exercise induced. She has  agreed to work on weight loss and gradually increase exercise to treat her exercise induced shortness of breath. Will continue to monitor closely.  3. Essential hypertension [name] is working on healthy weight loss and exercise to improve blood pressure control. We will watch for signs of hypotension as she continues her lifestyle modifications.Check labs today.  - Comprehensive metabolic panel  4. Vitamin D deficiency Low Vitamin D level contributes to fatigue and are associated with Alexandra, breast, and colon cancer. She agrees to follow-up for routine testing of Vitamin D, at least 2-3 times per year to avoid over-replacement. Check labs today.  - VITAMIN D 25 Hydroxy (Vit-D Deficiency, Fractures)  5. Gastroesophageal reflux disease, unspecified whether esophagitis present Intensive lifestyle modifications are the first line treatment for this issue. We discussed several lifestyle modifications today and she will continue to work on diet, exercise and weight loss efforts. Orders and follow up as documented in patient record.   Counseling . If a person has gastroesophageal reflux disease (GERD), food and stomach acid move back up into the esophagus and cause symptoms or problems such as damage to the esophagus. . Anti-reflux measures include: raising the head of the bed, avoiding tight clothing or belts, avoiding eating late at night, not lying down shortly after mealtime, and achieving weight loss. . Avoid ASA, NSAID's, caffeine, alcohol, and tobacco.  . OTC Pepcid and/or Tums are often very helpful for as needed use.  Marland Kitchen However, for persisting chronic or daily symptoms, stronger medications like Omeprazole may be needed. . You may need to avoid foods and drinks such as: ? Coffee and tea (with or without caffeine). ? Drinks that contain alcohol. ? Energy drinks and sports drinks. ? Bubbly (carbonated) drinks or sodas. ? Chocolate and cocoa. ? Peppermint and mint flavorings. ? Garlic and  onions. ? Horseradish. ? Spicy and acidic foods. These include peppers, chili powder, curry powder, vinegar, hot sauces, and BBQ sauce. ? Citrus fruit juices and citrus fruits, such as oranges, lemons, and limes. ? Tomato-based foods. These include red sauce, chili, salsa, and pizza with red sauce. ? Fried and fatty foods. These include donuts, french fries, potato chips, and high-fat dressings. ? High-fat meats. These include hot dogs, rib eye steak, sausage, ham, and bacon.  6. Hypertriglyceridemia Cardiovascular risk and specific lipid/LDL goals reviewed.  We discussed several lifestyle modifications today and Aireana will continue to work on diet, exercise and weight loss efforts. Orders and follow up as documented in patient record.   Counseling Intensive lifestyle modifications are the first line treatment for this issue. . Dietary changes: Increase soluble fiber. Decrease simple carbohydrates. . Exercise changes: Moderate to vigorous-intensity aerobic activity 150 minutes per week if tolerated. . Lipid-lowering medications: see documented in medical record.  - Lipid Panel With LDL/HDL Ratio  7. Elevated glucose Fasting labs will be obtained and results with be discussed with Marenda in 2 weeks at her follow up visit. In the meanwhile Honey was started on a lower simple carbohydrate diet and will work on weight loss efforts.  - Hemoglobin A1c - Insulin, random  8. Binge eating disorder Refer to Dr. Mallie Mussel.  9. Depression  screening Mekaela had a positive depression screening. Depression is commonly associated with Alexandra and often results in emotional eating behaviors. We will monitor this closely and work on CBT to help improve the non-hunger eating patterns. Referral to Psychology may be required if no improvement is seen as she continues in our clinic.  10. At risk for heart disease Ayanah was given approximately 15 minutes of coronary artery disease prevention counseling today. She  is 25 y.o. female and has risk factors for heart disease including Alexandra. We discussed intensive lifestyle modifications today with an emphasis on specific weight loss instructions and strategies.   Repetitive spaced learning was employed today to elicit superior memory formation and behavioral change.  11. Class 3 severe Alexandra due to excess calories with serious comorbidity and body mass index (BMI) of 40.0 to 44.9 in adult [facility]) Donisha is currently in the action stage of change and her goal is to continue with weight loss efforts. I recommend Lewis begin the structured treatment plan as follows:  She has agreed to the Category 3 Plan.  Exercise goals: No exercise has been prescribed at this time.   Behavioral modification strategies: increasing lean protein intake, decreasing simple carbohydrates, increasing vegetables, increasing water intake, decreasing eating out, no skipping meals, meal planning and cooking strategies, keeping healthy foods in the home and planning for success.  She was informed of the importance of frequent follow-up visits to maximize her success with intensive lifestyle modifications for her multiple health conditions. She was informed we would discuss her lab results at her next visit unless there is a critical issue that needs to be addressed sooner. Jakia agreed to keep her next visit at the agreed upon time to discuss these results.  Objective:   Blood pressure 133/86, pulse 79, temperature 98.1 F (36.7 C), height 5\' 5"  (1.651 m), weight 250 lb (113.4 kg), last menstrual period 09/10/2020, SpO2 98 %. Body mass index is 41.6 kg/m.  EKG: Normal sinus rhythm, rate 74.  Indirect Calorimeter completed today shows a VO2 of 322 and a REE of 2240.  Her calculated basal metabolic rate is 0938 thus her basal metabolic rate is better than expected.  General: Cooperative, alert, well developed, in no acute distress. HEENT: Conjunctivae and lids  unremarkable. Cardiovascular: Regular rhythm.  Lungs: Normal work of breathing. Neurologic: No focal deficits.   Lab Results  Component Value Date   CREATININE 0.66 10/01/2020   BUN 9 10/01/2020   NA 143 10/01/2020   K 4.2 10/01/2020   CL 104 10/01/2020   CO2 21 10/01/2020   Lab Results  Component Value Date   ALT 21 10/01/2020   AST 20 10/01/2020   ALKPHOS 60 10/01/2020   BILITOT 0.3 10/01/2020   Lab Results  Component Value Date   HGBA1C 5.8 (H) 10/01/2020   Lab Results  Component Value Date   INSULIN 14.7 10/01/2020   Lab Results  Component Value Date   TSH 1.890 10/01/2020   Lab Results  Component Value Date   CHOL 199 10/01/2020   HDL 42 10/01/2020   LDLCALC 125 (H) 10/01/2020   TRIG 182 (H) 10/01/2020   CHOLHDL 5.1 (H) 03/18/2019   Lab Results  Component Value Date   WBC 5.0 01/28/2016   HGB 13.6 01/28/2016   HCT 39.8 01/28/2016   MCV 87.7 01/28/2016   PLT 132 (L) 01/28/2016    Attestation Statements:   Reviewed by clinician on day of visit: allergies, medications, problem list, medical history, surgical history,  family history, social history, and previous encounter notes.  Coral Ceo, am acting as Location manager for [employer], DO.  I have reviewed the above documentation for accuracy and completeness, and I agree with the above. Jearld Lesch, DO

## 2020-10-03 ENCOUNTER — Encounter (INDEPENDENT_AMBULATORY_CARE_PROVIDER_SITE_OTHER): Payer: Self-pay | Admitting: Bariatrics

## 2020-10-08 ENCOUNTER — Telehealth (INDEPENDENT_AMBULATORY_CARE_PROVIDER_SITE_OTHER): Payer: 59 | Admitting: Psychology

## 2020-10-08 DIAGNOSIS — F411 Generalized anxiety disorder: Secondary | ICD-10-CM | POA: Diagnosis not present

## 2020-10-08 DIAGNOSIS — F5089 Other specified eating disorder: Secondary | ICD-10-CM

## 2020-10-08 NOTE — Progress Notes (Signed)
  Office: [phone]  /  Fax: [phone]    Date: October 18, 2020   Appointment Start Time: 2:00pm Duration: 31 minutes Provider: Glennie Isle, Psy.D. Type of Session: Individual Therapy  Location of Patient: Home Location of Provider: Provider's Home (private office) Type of Contact: Telepsychological Visit via MyChart Video Visit  Session Content: Alexandra Jones is a 25 y.o. female presenting for a follow-up appointment to address the previously established treatment goal of increasing coping skills. Today's appointment was a telepsychological visit due to COVID-19. Alexandra Jones provided verbal consent for today's telepsychological appointment and she is aware she is responsible for securing confidentiality on her end of the session. Prior to proceeding with today's appointment, Alexandra Jones's physical location at the time of this appointment was obtained as well a phone number she could be reached at in the event of technical difficulties. Alexandra Jones and this provider participated in today's telepsychological service.   This provider conducted a brief check-in. Alexandra Jones shared she is in the process of moving and she recently passed the exam she was preparing for. Regarding eating, she indicated, "It's been okay." She explained she does "better" in the mornings versus afternoons. This was further explored. Reviewed pleasurable activities. Alexandra Jones discussed engaging in pleasurable activities, noting, "It does help in the moment." Positive reinforcement was provided and she was encouraged to continue engaging in pleasurable activities. She agreed. Additionally, psychoeducation regarding emotional versus physical hunger was provided. Alexandra Jones was given a handout to utilize between now and the next appointment to increase awareness of hunger patterns and subsequent eating. Alexandra Jones provided verbal consent during today's appointment for this provider to send a handout about hunger patterns via e-mail. Alexandra Jones was receptive to today's  appointment as evidenced by openness to sharing, responsiveness to feedback, and willingness to explore hunger patterns.  Mental Status Examination:  Appearance: well groomed and appropriate hygiene  Behavior: appropriate to circumstances Mood: euthymic Affect: mood congruent Speech: normal in rate, volume, and tone Eye Contact: appropriate Psychomotor Activity: unable to assess  Gait: unable to assess Thought Process: linear, logical, and goal directed  Thought Content/Perception: no hallucinations, delusions, bizarre thinking or behavior reported or observed and no evidence of suicidal and homicidal ideation, plan, and intent Orientation: time, person, place, and purpose of appointment Memory/Concentration: memory, attention, language, and fund of knowledge intact  Insight/Judgment: fair  Interventions:  Conducted a brief chart review Provided empathic reflections and validation Reviewed content from the previous session Provided positive reinforcement Employed supportive psychotherapy interventions to facilitate reduced distress and to improve coping skills with identified stressors Psychoeducation provided regarding physical versus emotional hunger  DSM-5 Diagnosis(es): 300.02 (F41.1) Generalized Anxiety Disorder and 307.59 (F50.8) Other Specified Feeding or Eating Disorder, Emotional and Binge Eating Behaviors   Treatment Goal & Progress: During the initial appointment with this provider, the following treatment goal was established: increase coping skills. Progress is limited, as [name] has just begun treatment with this provider; however, she is receptive to the interaction and interventions and rapport is being established.   Plan: The next appointment will be scheduled in 2-3 weeks, which will be via MyChart Video Visit. The next session will focus on working towards the established treatment goal.

## 2020-10-15 ENCOUNTER — Ambulatory Visit (INDEPENDENT_AMBULATORY_CARE_PROVIDER_SITE_OTHER): Payer: 59 | Admitting: Bariatrics

## 2020-10-18 ENCOUNTER — Telehealth (INDEPENDENT_AMBULATORY_CARE_PROVIDER_SITE_OTHER): Payer: 59 | Admitting: Psychology

## 2020-10-18 ENCOUNTER — Other Ambulatory Visit: Payer: Self-pay

## 2020-10-18 ENCOUNTER — Ambulatory Visit (INDEPENDENT_AMBULATORY_CARE_PROVIDER_SITE_OTHER): Payer: 59 | Admitting: Family Medicine

## 2020-10-18 ENCOUNTER — Encounter (INDEPENDENT_AMBULATORY_CARE_PROVIDER_SITE_OTHER): Payer: Self-pay | Admitting: Family Medicine

## 2020-10-18 VITALS — BP 109/76 | HR 74 | Temp 98.2°F | Ht 65.0 in | Wt 250.0 lb

## 2020-10-18 DIAGNOSIS — Z9189 Other specified personal risk factors, not elsewhere classified: Secondary | ICD-10-CM

## 2020-10-18 DIAGNOSIS — E559 Vitamin D deficiency, unspecified: Secondary | ICD-10-CM | POA: Diagnosis not present

## 2020-10-18 DIAGNOSIS — R7303 Prediabetes: Secondary | ICD-10-CM | POA: Diagnosis not present

## 2020-10-18 DIAGNOSIS — E782 Mixed hyperlipidemia: Secondary | ICD-10-CM | POA: Diagnosis not present

## 2020-10-18 DIAGNOSIS — F5089 Other specified eating disorder: Secondary | ICD-10-CM | POA: Diagnosis not present

## 2020-10-18 DIAGNOSIS — Z6841 Body Mass Index (BMI) 40.0 and over, adult: Secondary | ICD-10-CM

## 2020-10-18 DIAGNOSIS — F411 Generalized anxiety disorder: Secondary | ICD-10-CM

## 2020-10-18 MED ORDER — VITAMIN D (ERGOCALCIFEROL) 1.25 MG (50000 UNIT) PO CAPS: 50000.0000 [IU] | ORAL_CAPSULE | ORAL | 0 refills | Status: DC

## 2020-10-18 MED ORDER — METFORMIN HCL 500 MG PO TABS: 500.0000 mg | ORAL_TABLET | Freq: Every day | ORAL | 0 refills | Status: DC

## 2020-10-22 NOTE — Progress Notes (Unsigned)
Office: [phone]  /  Fax: [phone]    Date: November 05, 2020   Appointment Start Time: *** Duration: *** minutes Provider: Glennie Isle, Psy.D. Type of Session: Individual Therapy  Location of Patient: {gbptloc:23249} Location of Provider: Provider's Home (private office) Type of Contact: Telepsychological Visit via MyChart Video Visit  Session Content: This provider called Alexandra Jones at 4:02pm as she did not present for the telepsychological appointment. *** As such, today's appointment was initiated *** minutes late.  Alexandra Jones is a 25 y.o. female presenting for a follow-up appointment to address the previously established treatment goal of increasing coping skills. Today's appointment was a telepsychological visit due to COVID-19. Alexandra Jones provided verbal consent for today's telepsychological appointment and she is aware she is responsible for securing confidentiality on her end of the session. Prior to proceeding with today's appointment, Alexandra Jones's physical location at the time of this appointment was obtained as well a phone number she could be reached at in the event of technical difficulties. Rickie and this provider participated in today's telepsychological service.   This provider conducted a brief check-in and verbally administered the PHQ-9 and GAD-7. *** Alexandra Jones was receptive to today's appointment as evidenced by openness to sharing, responsiveness to feedback, and {gbreceptiveness:23401}.  Mental Status Examination:  Appearance: {Appearance:22431} Behavior: {Behavior:22445} Mood: {gbmood:21757} Affect: {Affect:22436} Speech: {Speech:22432} Eye Contact: {Eye Contact:22433} Psychomotor Activity: {Motor Activity:22434} Gait: {gbgait:23404} Thought Process: {thought process:22448}  Thought Content/Perception: {disturbances:22451} Orientation: {Orientation:22437} Memory/Concentration: {gbcognition:22449} Insight/Judgment: {Insight:22446}  Structured Assessments Results: The Patient  Health Questionnaire-9 (PHQ-9) is a self-report measure that assesses symptoms and severity of depression over the course of the last two weeks. Alexandra Jones obtained a score of *** suggesting {GBPHQ9SEVERITY:21752}. Alexandra Jones finds the endorsed symptoms to be {gbphq9difficulty:21754}. [0= Not at all; 1= Several days; 2= More than half the days; 3= Nearly every day] Little interest or pleasure in doing things ***  Feeling down, depressed, or hopeless ***  Trouble falling or staying asleep, or sleeping too much ***  Feeling tired or having little energy ***  Poor appetite or overeating ***  Feeling bad about yourself --- or that you are a failure or have let yourself or your family down ***  Trouble concentrating on things, such as reading the newspaper or watching television ***  Moving or speaking so slowly that other people could have noticed? Or the opposite --- being so fidgety or restless that you have been moving around a lot more than usual ***  Thoughts that you would be better off dead or hurting yourself in some way ***  PHQ-9 Score ***    The Generalized Anxiety Disorder-7 (GAD-7) is a brief self-report measure that assesses symptoms of anxiety over the course of the last two weeks. Alexandra Jones obtained a score of *** suggesting {gbgad7severity:21753}. Alexandra Jones finds the endorsed symptoms to be {gbphq9difficulty:21754}. [0= Not at all; 1= Several days; 2= Over half the days; 3= Nearly every day] Feeling nervous, anxious, on edge ***  Not being able to stop or control worrying ***  Worrying too much about different things ***  Trouble relaxing ***  Being so restless that it's hard to sit still ***  Becoming easily annoyed or irritable ***  Feeling afraid as if something awful might happen ***  GAD-7 Score ***   Interventions:  {Interventions for Progress Notes:23405}  DSM-5 Diagnosis(es): 300.02 (F41.1) Generalized Anxiety Disorder and 307.59 (F50.8) Other Specified Feeding or Eating Disorder,  Emotional and Binge Eating Behaviors   Treatment Goal & Progress: During the initial appointment  with this provider, the following treatment goal was established: increase coping skills. Alexandra Jones has demonstrated progress in her goal as evidenced by {gbtxprogress:22839}. Alexandra Jones also {gbtxprogress2:22951}.  Plan: The next appointment will be scheduled in {gbweeks:21758}, which will be {gbtxmodality:23402}. The next session will focus on {Plan for Next Appointment:23400}.

## 2020-10-23 NOTE — Progress Notes (Signed)
Chief Complaint:   OBESITY Alexandra Jones is here to discuss her progress with her obesity treatment plan along with follow-up of her obesity related diagnoses. Alexandra Jones is on the Category 3 Plan and states she is following her eating plan approximately 75% of the time. Alexandra Jones states she is walking for 30 minutes 5 times per week.  Today's visit was #: 2 Starting weight: 250 lbs Starting date: 10/01/2020 Today's weight: 250 lbs Today's date: 10/18/2020 Total lbs lost to date: 0 Total lbs lost since last in-office visit: 0  Interim History: Alexandra Jones had a birthday and she did some celebration and social eating before she was able to start her plan. She struggled to eat all her protein, but otherwise she did well with her plan.  Subjective:   1. Mixed hyperlipidemia Alexandra Jones has a new diagnosis of hyperlipidemia. Her LDL and triglycerides are elevated. She is not on statin, and she is working on diet and weight loss. I discussed labs with the patient today.  2. Vitamin D deficiency Alexandra Jones has a new diagnosis of Vit D deficiency. Her Vit D level is lower than ideal, and she notes fatigue. I discussed labs with the patient today.  3. Pre-diabetes Alexandra Jones's A1c and fasting insulin are elevated, and she is working on diet and weight loss. She struggles to eat all of the protein on her plan. She notes polyphagia. I discussed labs with the patient today.  4. At risk for diabetes mellitus Alexandra Jones is at higher than average risk for developing diabetes due to obesity.   Assessment/Plan:   1. Mixed hyperlipidemia Cardiovascular risk and specific lipid/LDL goals reviewed. We discussed several lifestyle modifications today and Alexandra Jones will continue to work on diet, exercise and weight loss efforts. we will recheck labs in 3 months. Orders and follow up as documented in patient record.   2. Vitamin D deficiency Low Vitamin D level contributes to fatigue and are associated with obesity, breast, and colon cancer.  Alexandra Jones agreed to start prescription Vitamin D 50,000 IU every week with no refills. She will follow-up for routine testing of Vitamin D, at least 2-3 times per year to avoid over-replacement.  - Vitamin D, Ergocalciferol, (DRISDOL) 1.25 MG (50000 UNIT) CAPS capsule; Take 1 capsule (50,000 Units total) by mouth every 7 (seven) days.  Dispense: 4 capsule; Refill: 0  3. Pre-diabetes Alexandra Jones agreed to start metformin 500 mg q AM with food, with no refills. She will continue to work on weight loss, exercise, and decreasing simple carbohydrates to help decrease the risk of diabetes. We will recheck labs in 3 months.  - metFORMIN (GLUCOPHAGE) 500 MG tablet; Take 1 tablet (500 mg total) by mouth daily with breakfast.  Dispense: 30 tablet; Refill: 0  4. At risk for diabetes mellitus Alexandra Jones was given approximately 30 minutes of diabetes education and counseling today. We discussed intensive lifestyle modifications today with an emphasis on weight loss as well as increasing exercise and decreasing simple carbohydrates in her diet. We also reviewed medication options with an emphasis on risk versus benefit of those discussed.   Repetitive spaced learning was employed today to elicit superior memory formation and behavioral change.  5. Class 3 severe obesity with serious comorbidity and body mass index (BMI) of 40.0 to 44.9 in adult, unspecified obesity type (Alexandra Jones) Alexandra Jones is currently in the action stage of change. As such, her goal is to continue with weight loss efforts. She has agreed to change to keeping a food journal and adhering to  recommended goals of 1300-1500 calories and 85+ grams of protein daily.   Exercise goals: As is.  Behavioral modification strategies: meal planning and cooking strategies.  Alexandra Jones has agreed to follow-up with our clinic in 2 weeks. She was informed of the importance of frequent follow-up visits to maximize her success with intensive lifestyle modifications for her multiple health  conditions.   Objective:   Blood pressure 109/76, pulse 74, temperature 98.2 F (36.8 C), height 5\' 5"  (1.651 m), weight 250 lb (113.4 kg), SpO2 97 %. Body mass index is 41.6 kg/m.  General: Cooperative, alert, well developed, in no acute distress. HEENT: Conjunctivae and lids unremarkable. Cardiovascular: Regular rhythm.  Lungs: Normal work of breathing. Neurologic: No focal deficits.   Lab Results  Component Value Date   CREATININE 0.66 10/01/2020   BUN 9 10/01/2020   NA 143 10/01/2020   K 4.2 10/01/2020   CL 104 10/01/2020   CO2 21 10/01/2020   Lab Results  Component Value Date   ALT 21 10/01/2020   AST 20 10/01/2020   ALKPHOS 60 10/01/2020   BILITOT 0.3 10/01/2020   Lab Results  Component Value Date   HGBA1C 5.8 (H) 10/01/2020   Lab Results  Component Value Date   INSULIN 14.7 10/01/2020   Lab Results  Component Value Date   TSH 1.890 10/01/2020   Lab Results  Component Value Date   CHOL 199 10/01/2020   HDL 42 10/01/2020   LDLCALC 125 (H) 10/01/2020   TRIG 182 (H) 10/01/2020   CHOLHDL 5.1 (H) 03/18/2019   Lab Results  Component Value Date   WBC 5.0 01/28/2016   HGB 13.6 01/28/2016   HCT 39.8 01/28/2016   MCV 87.7 01/28/2016   PLT 132 (L) 01/28/2016   No results found for: IRON, TIBC, FERRITIN  Attestation Statements:   Reviewed by clinician on day of visit: allergies, medications, problem list, medical history, surgical history, family history, social history, and previous encounter notes.   I, Trixie Dredge, am acting as transcriptionist for Dennard Nip, MD.  I have reviewed the above documentation for accuracy and completeness, and I agree with the above. -  Dennard Nip, MD

## 2020-11-01 ENCOUNTER — Encounter (INDEPENDENT_AMBULATORY_CARE_PROVIDER_SITE_OTHER): Payer: Self-pay | Admitting: Family Medicine

## 2020-11-01 ENCOUNTER — Ambulatory Visit (INDEPENDENT_AMBULATORY_CARE_PROVIDER_SITE_OTHER): Payer: 59 | Admitting: Family Medicine

## 2020-11-01 ENCOUNTER — Other Ambulatory Visit: Payer: Self-pay

## 2020-11-01 VITALS — BP 122/74 | HR 83 | Temp 98.3°F | Ht 65.0 in | Wt 252.0 lb

## 2020-11-01 DIAGNOSIS — Z9189 Other specified personal risk factors, not elsewhere classified: Secondary | ICD-10-CM

## 2020-11-01 DIAGNOSIS — Z6841 Body Mass Index (BMI) 40.0 and over, adult: Secondary | ICD-10-CM

## 2020-11-01 DIAGNOSIS — E559 Vitamin D deficiency, unspecified: Secondary | ICD-10-CM

## 2020-11-01 DIAGNOSIS — R7303 Prediabetes: Secondary | ICD-10-CM

## 2020-11-01 MED ORDER — METFORMIN HCL 500 MG PO TABS: 500.0000 mg | ORAL_TABLET | Freq: Every day | ORAL | 0 refills | Status: DC

## 2020-11-01 MED ORDER — VITAMIN D (ERGOCALCIFEROL) 1.25 MG (50000 UNIT) PO CAPS: 50000.0000 [IU] | ORAL_CAPSULE | ORAL | 0 refills | Status: DC

## 2020-11-05 ENCOUNTER — Telehealth (INDEPENDENT_AMBULATORY_CARE_PROVIDER_SITE_OTHER): Payer: Self-pay | Admitting: Psychology

## 2020-11-05 ENCOUNTER — Telehealth (INDEPENDENT_AMBULATORY_CARE_PROVIDER_SITE_OTHER): Payer: 59 | Admitting: Psychology

## 2020-11-05 NOTE — Telephone Encounter (Signed)
  Office: [phone]  /  Fax: [phone]  Date of Call: November 05, 2020  Time of Call: 4:02pm Duration of Call: 5 minutes Provider: Glennie Isle, PsyD  CONTENT: This provider called Alexandra Jones to check-in as she did not present for today's MyChart Video Visit appointment at 4:00pm. Alexandra Jones indicated she forgot today's appointment and requested to reschedule. She was receptive to the one time no show fee waiver being applied to today's appointment. Notably, Alexandra Jones was offered various appointment days/times; however, with her work schedule, the appointment was rescheduled for three weeks from today. A brief check-in was conducted. She indicated, "Overall managing cravings," adding a routine helps. No evidence or endorsement of safety concerns.   PLAN: Alexandra Jones is scheduled for an appointment on November 26, 2020 at 4:00pm.

## 2020-11-07 NOTE — Progress Notes (Signed)
Chief Complaint:   OBESITY Alexandra Jones is here to discuss her progress with her obesity treatment plan along with follow-up of her obesity related diagnoses. Alexandra Jones is on keeping a food journal and adhering to recommended goals of 1300-1500 calories and 85+ grams of protein daily and states she is following her eating plan approximately 75-80% of the time. Alexandra Jones states she is walking for 30 minutes 5 times per week.  Today's visit was #: 3 Starting weight: 250 lbs Starting date: 10/01/2020 Today's weight: 252 lbs Today's date: 11/01/2020 Total lbs lost to date: 0 Total lbs lost since last in-office visit: 0  Interim History: Alexandra Jones is retaining some fluid today. She is working hard to find foods that meet her criteria, and she is open to looking at more breakfast options.  Subjective:   1. Pre-diabetes Alexandra Jones notes some GI upset with metformin. She tried to change the time of the medicine, but she still notes nausea.  2. Vitamin D deficiency Alexandra Jones is stable on Vit D, and she notes fatigue.  3. At risk for diabetes mellitus Alexandra Jones is at higher than average risk for developing diabetes due to obesity.   Assessment/Plan:   1. Pre-diabetes Alexandra Jones will continue to work on weight loss, exercise, and decreasing simple carbohydrates to help decrease the risk of diabetes. We will refill metformin for 1 month. Alexandra Jones is to decrease dose to 1/2 tablet PO q AM and will continue to follow up as directed.  - metFORMIN (GLUCOPHAGE) 500 MG tablet; Take 1 tablet (500 mg total) by mouth daily with breakfast.  Dispense: 30 tablet; Refill: 0  2. Vitamin D deficiency Low Vitamin D level contributes to fatigue and are associated with obesity, breast, and colon cancer. We will refill prescription Vitamin D for 1 month. Alexandra Jones will follow-up for routine testing of Vitamin D, at least 2-3 times per year to avoid over-replacement.  - Vitamin D, Ergocalciferol, (DRISDOL) 1.25 MG (50000 UNIT) CAPS capsule; Take 1  capsule (50,000 Units total) by mouth every 7 (seven) days.  Dispense: 4 capsule; Refill: 0  3. At risk for diabetes mellitus Alexandra Jones was given approximately 15 minutes of diabetes education and counseling today. We discussed intensive lifestyle modifications today with an emphasis on weight loss as well as increasing exercise and decreasing simple carbohydrates in her diet. We also reviewed medication options with an emphasis on risk versus benefit of those discussed.   Repetitive spaced learning was employed today to elicit superior memory formation and behavioral change.  4. Obesity with current BMI of 41.9 Alexandra Jones is currently in the action stage of change. As such, her goal is to continue with weight loss efforts. She has agreed to keeping a food journal and adhering to recommended goals of 1300-1500 calories and 85+ grams of protein daily.   Additional breakfast recipes were given today.  Exercise goals: As is.  Behavioral modification strategies: increasing lean protein intake and meal planning and cooking strategies.  Alexandra Jones has agreed to follow-up with our clinic in 2 weeks. She was informed of the importance of frequent follow-up visits to maximize her success with intensive lifestyle modifications for her multiple health conditions.   Objective:   Blood pressure 122/74, pulse 83, temperature 98.3 F (36.8 C), height 5\' 5"  (1.651 m), weight 252 lb (114.3 kg), SpO2 (!) 7 %. Body mass index is 41.93 kg/m.  General: Cooperative, alert, well developed, in no acute distress. HEENT: Conjunctivae and lids unremarkable. Cardiovascular: Regular rhythm.  Lungs: Normal work of  breathing. Neurologic: No focal deficits.   Lab Results  Component Value Date   CREATININE 0.66 10/01/2020   BUN 9 10/01/2020   NA 143 10/01/2020   K 4.2 10/01/2020   CL 104 10/01/2020   CO2 21 10/01/2020   Lab Results  Component Value Date   ALT 21 10/01/2020   AST 20 10/01/2020   ALKPHOS 60 10/01/2020    BILITOT 0.3 10/01/2020   Lab Results  Component Value Date   HGBA1C 5.8 (H) 10/01/2020   Lab Results  Component Value Date   INSULIN 14.7 10/01/2020   Lab Results  Component Value Date   TSH 1.890 10/01/2020   Lab Results  Component Value Date   CHOL 199 10/01/2020   HDL 42 10/01/2020   LDLCALC 125 (H) 10/01/2020   TRIG 182 (H) 10/01/2020   CHOLHDL 5.1 (H) 03/18/2019   Lab Results  Component Value Date   WBC 5.0 01/28/2016   HGB 13.6 01/28/2016   HCT 39.8 01/28/2016   MCV 87.7 01/28/2016   PLT 132 (L) 01/28/2016   No results found for: IRON, TIBC, FERRITIN  Attestation Statements:   Reviewed by clinician on day of visit: allergies, medications, problem list, medical history, surgical history, family history, social history, and previous encounter notes.   I, Trixie Dredge, am acting as transcriptionist for Dennard Nip, MD.  I have reviewed the above documentation for accuracy and completeness, and I agree with the above. -  Dennard Nip, MD

## 2020-11-09 ENCOUNTER — Other Ambulatory Visit (INDEPENDENT_AMBULATORY_CARE_PROVIDER_SITE_OTHER): Payer: Self-pay | Admitting: Family Medicine

## 2020-11-09 DIAGNOSIS — R7303 Prediabetes: Secondary | ICD-10-CM

## 2020-11-12 ENCOUNTER — Other Ambulatory Visit (INDEPENDENT_AMBULATORY_CARE_PROVIDER_SITE_OTHER): Payer: Self-pay | Admitting: Family Medicine

## 2020-11-12 DIAGNOSIS — R7303 Prediabetes: Secondary | ICD-10-CM

## 2020-11-12 DIAGNOSIS — E559 Vitamin D deficiency, unspecified: Secondary | ICD-10-CM

## 2020-11-12 NOTE — Progress Notes (Signed)
Office: [phone]  /  Fax: [phone]    Date: November 26, 2020   Appointment Start Time: 4:19pm Duration: 25 minutes Provider: Glennie Isle, Psy.D. 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 female presenting for a follow-up appointment to address the previously established treatment goal of increasing coping skills. Today's appointment was a telepsychological visit due to COVID-19. [name] provided verbal consent for today's telepsychological appointment and she is aware she is responsible for securing confidentiality on her end of the session. Prior to proceeding with today's appointment, [name]'s physical location at the time of this appointment was obtained as well a phone number she could be reached at in the event of technical difficulties. [name] and this provider participated in today's telepsychological service. Of note, today's appointment was initiated late due to this provider. Front desk called [name] prior to her appointment time to inform her of the delay.   This provider conducted a brief check-in. [name] shared she started a new job and is in the process of moving. She shared she is "having a hard time" engaging in coping strategies as she feels she does not have time once she is home. Regarding eating, [name] reported challenges with eating in the past 2.5-3 weeks. Thus, psychoeducation regarding triggers for emotional eating was provided. [name] was provided a handout, and encouraged to utilize the handout between now and the next appointment to increase awareness of triggers and frequency. [name] agreed. This provider also discussed behavioral strategies for specific triggers, such as placing the utensil down when conversing to avoid mindless eating. [name] provided verbal consent during today's  appointment for this provider to send a handout about triggers via e-mail. She was encouraged to engage in pleasurable activities to assist with emotional eating; she agreed. [name] was receptive to today's appointment as evidenced by openness to sharing, responsiveness to feedback, and willingness to explore triggers for emotional eating.  Mental Status Examination:  Appearance: well groomed and appropriate hygiene  Behavior: appropriate to circumstances Mood: anxious Affect: mood congruent Speech: normal in rate, volume, and tone Eye Contact: appropriate Psychomotor Activity: unable to assess  Gait: unable to assess Thought Process: linear, logical, and goal directed  Thought Content/Perception: no hallucinations, delusions, bizarre thinking or behavior reported or observed and no evidence or endorsement of suicidal and homicidal ideation, plan, and intent Orientation: time, person, place, and purpose of appointment Memory/Concentration: memory, attention, language, and fund of knowledge intact  Insight/Judgment: fair  Interventions:  Conducted a brief chart review Provided empathic reflections and validation Employed supportive psychotherapy interventions to facilitate reduced distress and to improve coping skills with identified stressors Psychoeducation provided regarding triggers for emotional eating  Discussed referral for longer-term therapeutic services  DSM-5 Diagnosis(es): 300.02 (F41.1) Generalized Anxiety Disorder and 307.59 (F50.8) Other Specified Feeding or Eating Disorder, Emotional and Binge Eating Behaviors   Treatment Goal & Progress: During the initial appointment with this provider, the following treatment goal was established: increase coping skills. [name] has demonstrated progress in her goal as evidenced by increased awareness of hunger patterns and increased awareness of triggers for emotional eating. [name] also continues to demonstrate willingness to engage in  learned skill(s).  Plan: Based on appointment availability and [name] requiring the latest available appointment due to work, the next appointment will be scheduled in three weeks, which will be via [name]  Visit. The next session will focus on working towards the established treatment goal. Additionally, [name] provided verbal consent for this provider to a place a referral with [facility] to address symptoms of anxiety.

## 2020-11-13 ENCOUNTER — Encounter (INDEPENDENT_AMBULATORY_CARE_PROVIDER_SITE_OTHER): Payer: Self-pay

## 2020-11-13 NOTE — Telephone Encounter (Signed)
Dr.Beasley 

## 2020-11-20 ENCOUNTER — Ambulatory Visit (INDEPENDENT_AMBULATORY_CARE_PROVIDER_SITE_OTHER): Payer: 59 | Admitting: Bariatrics

## 2020-11-20 ENCOUNTER — Encounter (INDEPENDENT_AMBULATORY_CARE_PROVIDER_SITE_OTHER): Payer: Self-pay | Admitting: Bariatrics

## 2020-11-20 ENCOUNTER — Other Ambulatory Visit: Payer: Self-pay

## 2020-11-20 VITALS — BP 123/85 | HR 80 | Temp 98.7°F | Ht 65.0 in | Wt 250.0 lb

## 2020-11-20 DIAGNOSIS — Z9189 Other specified personal risk factors, not elsewhere classified: Secondary | ICD-10-CM | POA: Diagnosis not present

## 2020-11-20 DIAGNOSIS — E559 Vitamin D deficiency, unspecified: Secondary | ICD-10-CM | POA: Diagnosis not present

## 2020-11-20 DIAGNOSIS — R7303 Prediabetes: Secondary | ICD-10-CM

## 2020-11-20 DIAGNOSIS — Z6841 Body Mass Index (BMI) 40.0 and over, adult: Secondary | ICD-10-CM | POA: Diagnosis not present

## 2020-11-20 MED ORDER — METFORMIN HCL 500 MG PO TABS: 500.0000 mg | ORAL_TABLET | Freq: Every day | ORAL | 0 refills | Status: DC

## 2020-11-20 MED ORDER — VITAMIN D (ERGOCALCIFEROL) 1.25 MG (50000 UNIT) PO CAPS
50000.0000 [IU] | ORAL_CAPSULE | ORAL | 0 refills | Status: DC
Start: 2020-11-20 — End: 2020-12-05

## 2020-11-22 ENCOUNTER — Encounter (INDEPENDENT_AMBULATORY_CARE_PROVIDER_SITE_OTHER): Payer: Self-pay | Admitting: Bariatrics

## 2020-11-22 NOTE — Progress Notes (Signed)
Chief Complaint:   OBESITY Alexandra Jones is here to discuss her progress with her obesity treatment plan along with follow-up of her obesity related diagnoses. Alexandra Jones is on the Category 3 Plan and states she is following her eating plan approximately 50-75%% of the time. Alexandra Jones states she is walking for 30 minutes 5 times per week.  Today's visit was #: 4 Starting weight: 250 lbs Starting date: 10/01/2020 Today's weight: 250 lbs Today's date: 11/20/2020 Total lbs lost to date: 0 Total lbs lost since last in-office visit: 2 lbs  Interim History: Alexandra Jones is down an additional 2 pounds.  She is not getting enough water.  Subjective:   1. Pre-diabetes Alexandra Jones has a diagnosis of prediabetes based on her elevated HgA1c and was informed this puts her at greater risk of developing diabetes. She continues to work on diet and exercise to decrease her risk of diabetes. She denies nausea or hypoglycemia.  Her appetite is normal.  She is taking metformin.  Lab Results  Component Value Date   HGBA1C 5.8 (H) 10/01/2020   Lab Results  Component Value Date   INSULIN 14.7 10/01/2020   2. Vitamin D deficiency Alexandra Jones's Vitamin D level was 25.0 on 10/01/2020. She is currently taking prescription vitamin D 50,000 IU each week. She denies nausea, vomiting or muscle weakness.  3. At risk for osteoporosis Alexandra Jones is at higher risk of osteopenia and osteoporosis due to Vitamin D deficiency.   Assessment/Plan:   1. Pre-diabetes Alexandra Jones will continue to work on weight loss, exercise, and decreasing simple carbohydrates to help decrease the risk of diabetes.  Continue metformin, as per below.  - Refill metFORMIN (GLUCOPHAGE) 500 MG tablet; Take 1 tablet (500 mg total) by mouth daily with breakfast.  Dispense: 30 tablet; Refill: 0  2. Vitamin D deficiency Low Vitamin D level contributes to fatigue and are associated with obesity, breast, and colon cancer. She agrees to continue to take prescription Vitamin D @50 ,000  IU every week and will follow-up for routine testing of Vitamin D, at least 2-3 times per year to avoid over-replacement.  - Refill Vitamin D, Ergocalciferol, (DRISDOL) 1.25 MG (50000 UNIT) CAPS capsule; Take 1 capsule (50,000 Units total) by mouth every 7 (seven) days.  Dispense: 4 capsule; Refill: 0  3. At risk for osteoporosis Alexandra Jones was given approximately 15 minutes of osteoporosis prevention counseling today. Alexandra Jones is at risk for osteopenia and osteoporosis due to her Vitamin D deficiency. She was encouraged to take her Vitamin D and follow her higher calcium diet and increase strengthening exercise to help strengthen her bones and decrease her risk of osteopenia and osteoporosis.  Repetitive spaced learning was employed today to elicit superior memory formation and behavioral change.  4. Obesity current BMI 41  Alexandra Jones is currently in the action stage of change. As such, her goal is to continue with weight loss efforts. She has agreed to the Category 3 Plan.   She will work on meal planning, mindful eating, and increasing her water intake.  Exercise goals: As is.  Behavioral modification strategies: increasing lean protein intake, decreasing simple carbohydrates, increasing vegetables, increasing water intake, decreasing eating out, no skipping meals, meal planning and cooking strategies, keeping healthy foods in the home and planning for success.  Falicity has agreed to follow-up with our clinic in 2 weeks with Alexandra Marble, NP, or Alexandra Jones, [place]. She was informed of the importance of frequent follow-up visits to maximize her success with intensive lifestyle modifications for her multiple  health conditions.   Objective:   Blood pressure 123/85, pulse 80, temperature 98.7 F (37.1 C), height 5\' 5"  (1.651 m), weight 250 lb (113.4 kg), SpO2 97 %. Body mass index is 41.6 kg/m.  General: Cooperative, alert, well developed, in no acute distress. HEENT: Conjunctivae and lids  unremarkable. Cardiovascular: Regular rhythm.  Lungs: Normal work of breathing. Neurologic: No focal deficits.   Lab Results  Component Value Date   CREATININE 0.66 10/01/2020   BUN 9 10/01/2020   NA 143 10/01/2020   K 4.2 10/01/2020   CL 104 10/01/2020   CO2 21 10/01/2020   Lab Results  Component Value Date   ALT 21 10/01/2020   AST 20 10/01/2020   ALKPHOS 60 10/01/2020   BILITOT 0.3 10/01/2020   Lab Results  Component Value Date   HGBA1C 5.8 (H) 10/01/2020   Lab Results  Component Value Date   INSULIN 14.7 10/01/2020   Lab Results  Component Value Date   TSH 1.890 10/01/2020   Lab Results  Component Value Date   CHOL 199 10/01/2020   HDL 42 10/01/2020   LDLCALC 125 (H) 10/01/2020   TRIG 182 (H) 10/01/2020   CHOLHDL 5.1 (H) 03/18/2019   Lab Results  Component Value Date   WBC 5.0 01/28/2016   HGB 13.6 01/28/2016   HCT 39.8 01/28/2016   MCV 87.7 01/28/2016   PLT 132 (L) 01/28/2016   Attestation Statements:   Reviewed by clinician on day of visit: allergies, medications, problem list, medical history, surgical history, family history, social history, and previous encounter notes.  I, Water quality scientist, CMA, am acting as Location manager for [employer], DO  I have reviewed the above documentation for accuracy and completeness, and I agree with the above. Jearld Lesch, DO

## 2020-11-26 ENCOUNTER — Telehealth (INDEPENDENT_AMBULATORY_CARE_PROVIDER_SITE_OTHER): Payer: 59 | Admitting: Psychology

## 2020-11-26 DIAGNOSIS — F411 Generalized anxiety disorder: Secondary | ICD-10-CM | POA: Diagnosis not present

## 2020-11-26 DIAGNOSIS — F5089 Other specified eating disorder: Secondary | ICD-10-CM | POA: Diagnosis not present

## 2020-11-30 ENCOUNTER — Encounter: Payer: Self-pay | Admitting: Family Medicine

## 2020-12-03 NOTE — Progress Notes (Signed)
Office: [phone]  /  Fax: [phone]    Date: Dec 17, 2020   Appointment Start Time: 4:02pm Duration: 28 minutes Provider: Glennie Isle, Psy.D. Type of Session: Individual Therapy  Location of Patient: Work (sitting in car) Location of Provider: Provider's Home (private office) Type of Contact: Telepsychological Visit via [place] Video Visit  Session Content: Alexandra Jones is a 25 y.o. female presenting for a follow-up appointment to address the previously established treatment goal of increasing coping skills. Today's appointment was a telepsychological visit due to COVID-19. Alexandra Jones provided verbal consent for today's telepsychological appointment and she is aware she is responsible for securing confidentiality on her end of the session. Prior to proceeding with today's appointment, Alexandra Jones's physical location at the time of this appointment was obtained as well a phone number she could be reached at in the event of technical difficulties. Alexandra Jones and this provider participated in today's telepsychological service. Of note, Alexandra Jones provided verbal consent at 4:10pm to switch to a regular telephone call due to connection challenges on her end, noting her phone was "overheating."   This provider conducted a brief check-in. Alexandra Jones shared about recent stressors. She shared she has "started reading again." Positive reinforcement was provided. Reviewed triggers for emotional eating. She acknowledged stress, anxiety, and boredom are her frequent triggers. Alexandra Jones also she has not had the opportunity to eat today, noting she sometimes is too busy to stop and eat\. Thus, psychoeducation regarding mindfulness was provided to assist with coping. A handout was provided to Alexandra Jones with further information regarding mindfulness, including exercises. This provider also explained the benefit of mindfulness as it relates to emotional eating. Alexandra Jones was encouraged to engage in the provided exercises between now and the next  appointment with this provider. Alexandra Jones agreed. During today's appointment, Alexandra Jones was led through a mindfulness exercise involving her senses. Alexandra Jones provided verbal consent during today's appointment for this provider to send a handout about mindfulness via e-mail. Alexandra Jones was receptive to today's appointment as evidenced by openness to sharing, responsiveness to feedback, and willingness to engage in mindfulness exercises to assist with coping.  Mental Status Examination:  Appearance: well groomed and appropriate hygiene  Behavior: appropriate to circumstances Mood: euthymic Affect: mood congruent Speech: normal in rate, volume, and tone Eye Contact: appropriate Psychomotor Activity: unable to assess  Gait: unable to assess Thought Process: linear, logical, and goal directed  Thought Content/Perception: no hallucinations, delusions, bizarre thinking or behavior reported or observed and no evidence or endorsement of suicidal and homicidal ideation, plan, and intent Orientation: time, person, place, and purpose of appointment Memory/Concentration: memory, attention, language, and fund of knowledge intact  Insight/Judgment: fair  Interventions:  Conducted a brief chart review Provided empathic reflections and validation Reviewed content from the previous session Employed supportive psychotherapy interventions to facilitate reduced distress and to improve coping skills with identified stressors Psychoeducation provided regarding mindfulness Engaged patient in mindfulness exercise(s)  DSM-5 Diagnosis(es): F41.1 Generalized Anxiety Disorder and F50.89 Other Specified Feeding or Eating Disorder, Emotional and Binge Eating Behaviors   Treatment Goal & Progress: During the initial appointment with this provider, the following treatment goal was established: increase coping skills. Alexandra Jones has demonstrated progress in her goal as evidenced by increased awareness of hunger patterns and increased awareness  of triggers for emotional eating. Alexandra Jones also continues to demonstrate willingness to engage in learned skill(s).  Plan: Based on appointment availability and [name] requiring the latest appointment available due to work, the next appointment will be scheduled in approximately one month, which  will be via MyChart Video Visit. The next session will focus on working towards the established treatment goal. Additionally, Alexandra Jones noted a plan to call  Alexandra Jones again after this appointment.

## 2020-12-04 ENCOUNTER — Other Ambulatory Visit: Payer: Self-pay

## 2020-12-04 ENCOUNTER — Ambulatory Visit (INDEPENDENT_AMBULATORY_CARE_PROVIDER_SITE_OTHER): Payer: 59 | Admitting: Family Medicine

## 2020-12-04 ENCOUNTER — Encounter (INDEPENDENT_AMBULATORY_CARE_PROVIDER_SITE_OTHER): Payer: Self-pay | Admitting: Family Medicine

## 2020-12-04 VITALS — BP 106/65 | HR 83 | Temp 97.5°F | Ht 65.0 in | Wt 248.0 lb

## 2020-12-04 DIAGNOSIS — Z9189 Other specified personal risk factors, not elsewhere classified: Secondary | ICD-10-CM | POA: Diagnosis not present

## 2020-12-04 DIAGNOSIS — Z6841 Body Mass Index (BMI) 40.0 and over, adult: Secondary | ICD-10-CM

## 2020-12-04 DIAGNOSIS — E559 Vitamin D deficiency, unspecified: Secondary | ICD-10-CM

## 2020-12-04 DIAGNOSIS — R7303 Prediabetes: Secondary | ICD-10-CM | POA: Diagnosis not present

## 2020-12-05 MED ORDER — VITAMIN D (ERGOCALCIFEROL) 1.25 MG (50000 UNIT) PO CAPS: 50000.0000 [IU] | ORAL_CAPSULE | ORAL | 0 refills | Status: DC

## 2020-12-05 MED ORDER — METFORMIN HCL 500 MG PO TABS: 500.0000 mg | ORAL_TABLET | Freq: Every day | ORAL | 0 refills | Status: DC

## 2020-12-06 NOTE — Progress Notes (Signed)
Chief Complaint:   OBESITY Alexandra Jones is here to discuss her progress with her obesity treatment plan along with follow-up of her obesity related diagnoses. Alexandra Jones is on the Category 3 Plan and states she is following her eating plan approximately 70% of the time. Alexandra Jones states she is doing 0 minutes 0 times per week.  Today's visit was #: 5 Starting weight: 250 lbs Starting date: 10/01/2020 Today's weight: 248 lbs Today's date: 12/04/2020 Total lbs lost to date: 2 Total lbs lost since last in-office visit: 2  Interim History: Alexandra Jones has done well with weight loss on her Category 3 plan. She is struggling with the dinner meal however, and she is open to discussing other options for that meal.  Subjective:   1. Vitamin D deficiency Alexandra Jones is stable on Vit D, and she didn't get her last prescription due to pharmacy error.  2. Pre-diabetes Alexandra Jones is stable on metformin, and she is working on diet and weight loss.   3. At risk for diabetes mellitus Alexandra Jones is at higher than average risk for developing diabetes due to obesity.   Assessment/Plan:   1. Vitamin D deficiency Low Vitamin D level contributes to fatigue and are associated with obesity, breast, and colon cancer. We will refill prescription Vitamin D for 1 month. She is to verify that the pharmacy has this prescription. She will follow-up for routine testing of Vitamin D, at least 2-3 times per year to avoid over-replacement.  - Vitamin D, Ergocalciferol, (DRISDOL) 1.25 MG (50000 UNIT) CAPS capsule; Take 1 capsule (50,000 Units total) by mouth every 7 (seven) days.  Dispense: 4 capsule; Refill: 0  2. Pre-diabetes Alexandra Jones will continue to work on weight loss, diet, exercise, and decreasing simple carbohydrates to help decrease the risk of diabetes. We will refill metformin for 1 month.  - metFORMIN (GLUCOPHAGE) 500 MG tablet; Take 1 tablet (500 mg total) by mouth daily with breakfast.  Dispense: 30 tablet; Refill: 0  3. At risk for  diabetes mellitus Alexandra Jones was given approximately 15 minutes of diabetes education and counseling today. We discussed intensive lifestyle modifications today with an emphasis on weight loss as well as increasing exercise and decreasing simple carbohydrates in her diet. We also reviewed medication options with an emphasis on risk versus benefit of those discussed.   Repetitive spaced learning was employed today to elicit superior memory formation and behavioral change.  4. Obesity with current BMI of 41.3 Alexandra Jones is currently in the action stage of change. As such, her goal is to continue with weight loss efforts. She has agreed to the Category 3 Plan and keeping a food journal and adhering to recommended goals of 400-600 calories and 40+ grams of protein at supper daily.   Behavioral modification strategies: increasing lean protein intake.  Alexandra Jones has agreed to follow-up with our clinic in 3 weeks. She was informed of the importance of frequent follow-up visits to maximize her success with intensive lifestyle modifications for her multiple health conditions.   Objective:   Blood pressure 106/65, pulse 83, temperature (!) 97.5 F (36.4 C), height 5\' 5"  (1.651 m), weight 248 lb (112.5 kg), SpO2 97 %. Body mass index is 41.27 kg/m.  General: Cooperative, alert, well developed, in no acute distress. HEENT: Conjunctivae and lids unremarkable. Cardiovascular: Regular rhythm.  Lungs: Normal work of breathing. Neurologic: No focal deficits.   Lab Results  Component Value Date   CREATININE 0.66 10/01/2020   BUN 9 10/01/2020   NA 143 10/01/2020  K 4.2 10/01/2020   CL 104 10/01/2020   CO2 21 10/01/2020   Lab Results  Component Value Date   ALT 21 10/01/2020   AST 20 10/01/2020   ALKPHOS 60 10/01/2020   BILITOT 0.3 10/01/2020   Lab Results  Component Value Date   HGBA1C 5.8 (H) 10/01/2020   Lab Results  Component Value Date   INSULIN 14.7 10/01/2020   Lab Results  Component Value  Date   TSH 1.890 10/01/2020   Lab Results  Component Value Date   CHOL 199 10/01/2020   HDL 42 10/01/2020   LDLCALC 125 (H) 10/01/2020   TRIG 182 (H) 10/01/2020   CHOLHDL 5.1 (H) 03/18/2019   Lab Results  Component Value Date   WBC 5.0 01/28/2016   HGB 13.6 01/28/2016   HCT 39.8 01/28/2016   MCV 87.7 01/28/2016   PLT 132 (L) 01/28/2016   No results found for: IRON, TIBC, FERRITIN  Attestation Statements:   Reviewed by clinician on day of visit: allergies, medications, problem list, medical history, surgical history, family history, social history, and previous encounter notes.   I, Trixie Dredge, am acting as transcriptionist for Dennard Nip, MD.  I have reviewed the above documentation for accuracy and completeness, and I agree with the above. -  Dennard Nip, MD

## 2020-12-07 ENCOUNTER — Other Ambulatory Visit (INDEPENDENT_AMBULATORY_CARE_PROVIDER_SITE_OTHER): Payer: Self-pay | Admitting: Family Medicine

## 2020-12-07 DIAGNOSIS — R7303 Prediabetes: Secondary | ICD-10-CM

## 2020-12-10 NOTE — Telephone Encounter (Signed)
Dr.Beasley 

## 2020-12-17 ENCOUNTER — Telehealth (INDEPENDENT_AMBULATORY_CARE_PROVIDER_SITE_OTHER): Payer: 59 | Admitting: Psychology

## 2020-12-17 DIAGNOSIS — F5089 Other specified eating disorder: Secondary | ICD-10-CM

## 2020-12-17 DIAGNOSIS — F411 Generalized anxiety disorder: Secondary | ICD-10-CM

## 2020-12-18 ENCOUNTER — Other Ambulatory Visit: Payer: Self-pay

## 2020-12-18 ENCOUNTER — Ambulatory Visit (INDEPENDENT_AMBULATORY_CARE_PROVIDER_SITE_OTHER): Payer: 59 | Admitting: Bariatrics

## 2020-12-18 VITALS — BP 122/77 | HR 90 | Temp 98.1°F | Ht 65.0 in | Wt 245.0 lb

## 2020-12-18 DIAGNOSIS — R7303 Prediabetes: Secondary | ICD-10-CM

## 2020-12-18 DIAGNOSIS — Z6841 Body Mass Index (BMI) 40.0 and over, adult: Secondary | ICD-10-CM | POA: Diagnosis not present

## 2020-12-18 DIAGNOSIS — E559 Vitamin D deficiency, unspecified: Secondary | ICD-10-CM | POA: Diagnosis not present

## 2020-12-18 DIAGNOSIS — Z9189 Other specified personal risk factors, not elsewhere classified: Secondary | ICD-10-CM

## 2020-12-18 MED ORDER — VITAMIN D (ERGOCALCIFEROL) 1.25 MG (50000 UNIT) PO CAPS: 50000.0000 [IU] | ORAL_CAPSULE | ORAL | 0 refills | Status: DC

## 2020-12-18 MED ORDER — METFORMIN HCL 500 MG PO TABS: 500.0000 mg | ORAL_TABLET | Freq: Every day | ORAL | 0 refills | Status: DC

## 2020-12-18 MED ORDER — OZEMPIC (0.25 OR 0.5 MG/DOSE) 2 MG/1.5ML ~~LOC~~ SOPN: 0.2500 mg | PEN_INJECTOR | SUBCUTANEOUS | 0 refills | Status: DC

## 2020-12-19 ENCOUNTER — Encounter (INDEPENDENT_AMBULATORY_CARE_PROVIDER_SITE_OTHER): Payer: Self-pay | Admitting: Bariatrics

## 2020-12-19 NOTE — Progress Notes (Signed)
Chief Complaint:   OBESITY Alexandra Jones is here to discuss her progress with her obesity treatment plan along with follow-up of her obesity related diagnoses. Alexandra Jones is on the Category 3 Plan and states she is following her eating plan approximately 50-75% of the time. Alexandra Jones states she is walking 30 minutes 4 times per week.  Today's visit was #: 6 Starting weight: 250 lbs Starting date: 10/01/2020 Today's weight: 245 lbs Today's date: 12/18/2020 Total lbs lost to date: 5 Total lbs lost since last in-office visit: 3  Interim History: Alexandra Jones is down 3 lbs since her last visit. She states that she has not been doing as well as before. She is eating out more.   Subjective:   1. Vitamin D deficiency Alexandra Jones is taking prescription Vit D.   2. Pre-diabetes Alexandra Jones reports cravings and hunger are okay.   3. At risk for osteoporosis Alexandra Jones is at higher risk of osteopenia and osteoporosis due to Vitamin D deficiency.   Assessment/Plan:   1. Vitamin D deficiency Low Vitamin D level contributes to fatigue and are associated with obesity, breast, and colon cancer. She agrees to continue to take prescription Vitamin D @50 ,000 IU every week and will follow-up for routine testing of Vitamin D, at least 2-3 times per year to avoid over-replacement.  - Vitamin D, Ergocalciferol, (DRISDOL) 1.25 MG (50000 UNIT) CAPS capsule; Take 1 capsule (50,000 Units total) by mouth every 7 (seven) days.  Dispense: 4 capsule; Refill: 0  2. Pre-diabetes Alexandra Jones will continue to work on weight loss, exercise, and decreasing simple carbohydrates to help decrease the risk of diabetes. Decrease carbs and increase activity. Start Ozempic 0.25 mg, as prescribed below. If pt misses a cycle, she is to take a pregnancy test and stop medication is positive.   - metFORMIN (GLUCOPHAGE) 500 MG tablet; Take 1 tablet (500 mg total) by mouth daily with breakfast.  Dispense: 30 tablet; Refill: 0 - Semaglutide,0.25 or 0.5MG /DOS, (OZEMPIC,  0.25 OR 0.5 MG/DOSE,) 2 MG/1.5ML SOPN; Inject 0.25 mg into the skin once a week.  Dispense: 1.5 mL; Refill: 0  3. At risk for osteoporosis Alexandra Jones was given approximately 15 minutes of osteoporosis prevention counseling today. Alexandra Jones is at risk for osteopenia and osteoporosis due to her Vitamin D deficiency. She was encouraged to take her Vitamin D and follow her higher calcium diet and increase strengthening exercise to help strengthen her bones and decrease her risk of osteopenia and osteoporosis.  Repetitive spaced learning was employed today to elicit superior memory formation and behavioral change.  4. Obesity current BMI 40  Alexandra Jones is currently in the action stage of change. As such, her goal is to continue with weight loss efforts. She has agreed to the Category 3 Plan.   Meal plan Will adhere closely to the plan Handout: "On the Road"  Exercise goals: As is  Behavioral modification strategies: increasing lean protein intake, decreasing simple carbohydrates, increasing vegetables, increasing water intake, decreasing eating out, no skipping meals, meal planning and cooking strategies, keeping healthy foods in the home and planning for success.  Alexandra Jones has agreed to follow-up with our clinic in 2 weeks. She was informed of the importance of frequent follow-up visits to maximize her success with intensive lifestyle modifications for her multiple health conditions.   Objective:   Blood pressure 122/77, pulse 90, temperature 98.1 F (36.7 C), height 5\' 5"  (1.651 m), weight 245 lb (111.1 kg), SpO2 96 %. Body mass index is 40.77 kg/m.  General: Cooperative,  alert, well developed, in no acute distress. HEENT: Conjunctivae and lids unremarkable. Cardiovascular: Regular rhythm.  Lungs: Normal work of breathing. Neurologic: No focal deficits.   Lab Results  Component Value Date   CREATININE 0.66 10/01/2020   BUN 9 10/01/2020   NA 143 10/01/2020   K 4.2 10/01/2020   CL 104 10/01/2020    CO2 21 10/01/2020   Lab Results  Component Value Date   ALT 21 10/01/2020   AST 20 10/01/2020   ALKPHOS 60 10/01/2020   BILITOT 0.3 10/01/2020   Lab Results  Component Value Date   HGBA1C 5.8 (H) 10/01/2020   Lab Results  Component Value Date   INSULIN 14.7 10/01/2020   Lab Results  Component Value Date   TSH 1.890 10/01/2020   Lab Results  Component Value Date   CHOL 199 10/01/2020   HDL 42 10/01/2020   LDLCALC 125 (H) 10/01/2020   TRIG 182 (H) 10/01/2020   CHOLHDL 5.1 (H) 03/18/2019   Lab Results  Component Value Date   WBC 5.0 01/28/2016   HGB 13.6 01/28/2016   HCT 39.8 01/28/2016   MCV 87.7 01/28/2016   PLT 132 (L) 01/28/2016    Attestation Statements:   Reviewed by clinician on day of visit: allergies, medications, problem list, medical history, surgical history, family history, social history, and previous encounter notes.  Coral Ceo, CMA, am acting as Location manager for [employer], DO.  I have reviewed the above documentation for accuracy and completeness, and I agree with the above. Jearld Lesch, DO

## 2020-12-25 NOTE — Progress Notes (Signed)
Office: [phone]  /  Fax: [phone]    Date: Jan 08, 2021   Appointment Start Time: 9:02am Duration: 32 minutes Provider: Glennie Isle, Psy.D. Type of Session: Individual Therapy  Location of Patient: Parked in car (safe location) Location of Provider: Provider's home (private office) Type of Contact: Telepsychological Visit via [place] Video Visit  Session Content: Alexandra Jones is a 25 y.o. female presenting for a follow-up appointment to address the previously established treatment goal of increasing coping skills. Today's appointment was a telepsychological visit due to COVID-19. Alexandra Jones provided verbal consent for today's telepsychological appointment and she is aware she is responsible for securing confidentiality on her end of the session. Prior to proceeding with today's appointment, Alexandra Jones's physical location at the time of this appointment was obtained as well a phone number she could be reached at in the event of technical difficulties. Alexandra Jones and this provider participated in today's telepsychological service. Of note, today's appointment was switched to a regular telephone call with Alexandra Jones's verbal consent at 9:09am due to her phone overheating.  This provider conducted a brief check-in. Alexandra Jones shared about recent events, including a long work week. She indicated she continues to do well with her meal plan while at work, but it is challenging at home as her parents cook. She acknowledged that often her decisions to eat what they have made is from an emotional hunger stand point. To assist with ongoing challenges, psychoeducation about values was provided and hypothetical scenarios were utilized to identify her values (family, work/life balance, and stability). She was encouraged to set short-term and long-term congruent to her values, especially as it relates to her goals with the clinic. She agreed. Moreover, this provider checked-in regarding the referral placed with [facility]. Alexandra Jones shared she left three voicemail messages; however, never received a call back. As such, she was receptive to this provider placing the referral again. Overall, Alexandra Jones was receptive to today's appointment as evidenced by openness to sharing, responsiveness to feedback, and willingness to identify her values and set goals congruent to them.   Mental Status Examination:  Appearance: well groomed and appropriate hygiene  Behavior: appropriate to circumstances Mood: euthymic Affect: mood congruent Speech: normal in rate, volume, and tone Eye Contact: appropriate Psychomotor Activity: unable to assess  Gait: unable to assess Thought Process: linear, logical, and goal directed  Thought Content/Perception: no hallucinations, delusions, bizarre thinking or behavior reported or observed and no evidence or endorsement of suicidal and homicidal ideation, plan, and intent Orientation: time, person, place, and purpose of appointment Memory/Concentration: memory, attention, language, and fund of knowledge intact  Insight/Judgment: fair  Interventions:  Conducted a brief chart review Employed supportive psychotherapy interventions to facilitate reduced distress and to improve coping skills with identified stressors Employed motivational interviewing skills to assess patient's willingness/desire to adhere to recommended medical treatments and assignments Psychoeducation provided regarding values Explored patient's values   DSM-5 Diagnosis(es): F41.1 Generalized Anxiety Disorder and F50.89 Other Specified Feeding or Eating Disorder, Emotional and Binge Eating Behaviors   Treatment Goal & Progress: During the initial appointment with this provider, the following treatment goal was established: increase coping skills. Alexandra Jones has demonstrated progress in her goal as evidenced by increased awareness of hunger patterns and increased awareness of triggers for emotional eating. Alexandra Jones also continues to  demonstrate willingness to engage in learned skill(s).  Plan: Due to Alexandra Jones's upcoming vacation, the next appointment will be scheduled in 2-3 weeks, which will be via MyChart Video Visit. The next session will  focus on working towards the established treatment goal. Additionally, a referral with [facility] will be placed again.

## 2020-12-30 ENCOUNTER — Other Ambulatory Visit (INDEPENDENT_AMBULATORY_CARE_PROVIDER_SITE_OTHER): Payer: Self-pay | Admitting: Family Medicine

## 2020-12-30 DIAGNOSIS — R7303 Prediabetes: Secondary | ICD-10-CM

## 2021-01-08 ENCOUNTER — Telehealth (INDEPENDENT_AMBULATORY_CARE_PROVIDER_SITE_OTHER): Payer: 59 | Admitting: Psychology

## 2021-01-08 ENCOUNTER — Other Ambulatory Visit: Payer: Self-pay

## 2021-01-08 ENCOUNTER — Ambulatory Visit (INDEPENDENT_AMBULATORY_CARE_PROVIDER_SITE_OTHER): Payer: 59 | Admitting: Family Medicine

## 2021-01-08 ENCOUNTER — Encounter (INDEPENDENT_AMBULATORY_CARE_PROVIDER_SITE_OTHER): Payer: Self-pay | Admitting: Family Medicine

## 2021-01-08 VITALS — BP 111/74 | HR 62 | Temp 97.4°F | Ht 65.0 in | Wt 251.0 lb

## 2021-01-08 DIAGNOSIS — F411 Generalized anxiety disorder: Secondary | ICD-10-CM

## 2021-01-08 DIAGNOSIS — E66813 Obesity, class 3: Secondary | ICD-10-CM

## 2021-01-08 DIAGNOSIS — F5089 Other specified eating disorder: Secondary | ICD-10-CM | POA: Diagnosis not present

## 2021-01-08 DIAGNOSIS — R7303 Prediabetes: Secondary | ICD-10-CM

## 2021-01-08 DIAGNOSIS — Z6841 Body Mass Index (BMI) 40.0 and over, adult: Secondary | ICD-10-CM | POA: Diagnosis not present

## 2021-01-08 DIAGNOSIS — E559 Vitamin D deficiency, unspecified: Secondary | ICD-10-CM

## 2021-01-08 DIAGNOSIS — Z9189 Other specified personal risk factors, not elsewhere classified: Secondary | ICD-10-CM

## 2021-01-08 MED ORDER — VITAMIN D (ERGOCALCIFEROL) 1.25 MG (50000 UNIT) PO CAPS: 50000.0000 [IU] | ORAL_CAPSULE | ORAL | 0 refills | Status: DC

## 2021-01-08 MED ORDER — METFORMIN HCL 500 MG PO TABS: 500.0000 mg | ORAL_TABLET | Freq: Every day | ORAL | 0 refills | Status: DC

## 2021-01-09 NOTE — Progress Notes (Signed)
Chief Complaint:   OBESITY Alexandra Jones is here to discuss her progress with her obesity treatment plan along with follow-up of her obesity related diagnoses. Alexandra Jones is on the Category 3 Plan and states she is following her eating plan approximately 50-75% of the time. Alexandra Jones states she is walking for 30 minutes 2-3 times per week.  Today's visit was #: 7 Starting weight: 250 lbs Starting date: 10/01/2020 Today's weight: 251 lbs Today's date: 01/08/2021 Total lbs lost to date: 0 Total lbs lost since last in-office visit: 0  Interim History: Alexandra Jones has been working more days and hours and this has affected her meal planning. She notes stress at work and issues with hunger. She will be going on vacation soon and she is worried about vacation weight gain.  Subjective:   1. Pre-diabetes Alexandra Jones is stable on metformin, but she hasn't started Ozempic yet. She states she is worried about low blood sugar especially at work.  2. Vitamin D deficiency Alexandra Jones is stable on Vit D, and she denies signs of over-replacement.  3. At risk for diabetes mellitus Alexandra Jones is at higher than average risk for developing diabetes due to obesity.   Assessment/Plan:   1. Pre-diabetes Alexandra Jones will continue to work on weight loss, exercise, and decreasing simple carbohydrates to help decrease the risk of diabetes. We will refill metformin for 1 month. Alexandra Jones was encouraged to start Ozempic, and she was reassured that GLP-1s do not generally decrease glucose too low, but skipping meals can. She was shown how to take medicine on a demo pen.  - metFORMIN (GLUCOPHAGE) 500 MG tablet; Take 1 tablet (500 mg total) by mouth daily with breakfast.  Dispense: 30 tablet; Refill: 0  2. Vitamin D deficiency Low Vitamin D level contributes to fatigue and are associated with obesity, breast, and colon cancer. We will refill prescription Vitamin D for 1 month. Alexandra Jones will follow-up for routine testing of Vitamin D, at least 2-3 times per year  to avoid over-replacement.  - Vitamin D, Ergocalciferol, (DRISDOL) 1.25 MG (50000 UNIT) CAPS capsule; Take 1 capsule (50,000 Units total) by mouth every 7 (seven) days.  Dispense: 4 capsule; Refill: 0  3. At risk for diabetes mellitus Alexandra Jones was given approximately 15 minutes of diabetes education and counseling today. We discussed intensive lifestyle modifications today with an emphasis on weight loss as well as increasing exercise and decreasing simple carbohydrates in her diet. We also reviewed medication options with an emphasis on risk versus benefit of those discussed.   Repetitive spaced learning was employed today to elicit superior memory formation and behavioral change.  4. Obesity with current BMI 41.8 Alexandra Jones is currently in the action stage of change. As such, her goal is to continue with weight loss efforts. She has agreed to the Category 3 Plan.   Exercise goals: As is.  Behavioral modification strategies: increasing lean protein intake, increasing vegetables, travel eating strategies and celebration eating strategies.  Alexandra Jones has agreed to follow-up with our clinic in 3 weeks. She was informed of the importance of frequent follow-up visits to maximize her success with intensive lifestyle modifications for her multiple health conditions.   Objective:   Blood pressure 111/74, pulse 62, temperature (!) 97.4 F (36.3 C), height 5\' 5"  (1.651 m), weight 251 lb (113.9 kg), SpO2 97 %. Body mass index is 41.77 kg/m.  General: Cooperative, alert, well developed, in no acute distress. HEENT: Conjunctivae and lids unremarkable. Cardiovascular: Regular rhythm.  Lungs: Normal work of breathing. Neurologic:  No focal deficits.   Lab Results  Component Value Date   CREATININE 0.66 10/01/2020   BUN 9 10/01/2020   NA 143 10/01/2020   K 4.2 10/01/2020   CL 104 10/01/2020   CO2 21 10/01/2020   Lab Results  Component Value Date   ALT 21 10/01/2020   AST 20 10/01/2020   ALKPHOS 60  10/01/2020   BILITOT 0.3 10/01/2020   Lab Results  Component Value Date   HGBA1C 5.8 (H) 10/01/2020   Lab Results  Component Value Date   INSULIN 14.7 10/01/2020   Lab Results  Component Value Date   TSH 1.890 10/01/2020   Lab Results  Component Value Date   CHOL 199 10/01/2020   HDL 42 10/01/2020   LDLCALC 125 (H) 10/01/2020   TRIG 182 (H) 10/01/2020   CHOLHDL 5.1 (H) 03/18/2019   Lab Results  Component Value Date   WBC 5.0 01/28/2016   HGB 13.6 01/28/2016   HCT 39.8 01/28/2016   MCV 87.7 01/28/2016   PLT 132 (L) 01/28/2016   No results found for: IRON, TIBC, FERRITIN  Attestation Statements:   Reviewed by clinician on day of visit: allergies, medications, problem list, medical history, surgical history, family history, social history, and previous encounter notes.   I, Trixie Dredge, am acting as transcriptionist for Dennard Nip, MD.  I have reviewed the above documentation for accuracy and completeness, and I agree with the above. -  Dennard Nip, MD

## 2021-01-12 ENCOUNTER — Other Ambulatory Visit (INDEPENDENT_AMBULATORY_CARE_PROVIDER_SITE_OTHER): Payer: Self-pay | Admitting: Bariatrics

## 2021-01-12 DIAGNOSIS — R7303 Prediabetes: Secondary | ICD-10-CM

## 2021-01-15 NOTE — Progress Notes (Signed)
Office: [phone]  /  Fax: [phone]    Date: January 29, 2021   Appointment Start Time: 10:01am Duration: 25 minutes Provider: Glennie Isle, Psy.D. Type of Session: Individual Therapy  Location of Patient: Parked in car outside of [employer] in [place] (safe location) Location of Provider: Provider's home (private office) Type of Contact: Telepsychological Visit via [place] Video Visit  Session Content: Alexandra Jones is a 25 y.o. female presenting for a follow-up appointment to address the previously established treatment goal of increasing coping skills. Today's appointment was a telepsychological visit due to COVID-19. Alexandra Jones provided verbal consent for today's telepsychological appointment and she is aware she is responsible for securing confidentiality on her end of the session. Prior to proceeding with today's appointment, Alexandra Jones's physical location at the time of this appointment was obtained as well a phone number she could be reached at in the event of technical difficulties. Alexandra Jones and this provider participated in today's telepsychological service.   This provider conducted a brief check-in. Alexandra Jones stated she took a "quick trip to the beach" and continues to be busy with work. She acknowledged "mindless" eating while at work. This was further explored. It was reflected she is going long periods without eating when working. She shared a plan to factor in a snack between breakfast and lunch. Brainstormed snacks with protein to take to work (e.g., tuna packets). Reflected on recent weight loss and discussed what helped with the aforementioned. Alexandra Jones stated she is meal prepping "a little." Positive reinforcement was provided and she was encouraged to continue to engaging in meal prep even if it is only for certain meals/snacks. Session focused further on mindfulness to assist with coping. Alexandra Jones stated she is being "more cognizant" about what she is doing. This provider discussed the utilization  of YouTube for mindfulness exercises (e.g., exercises by Merri Ray). She was encouraged to engage in a mindfulness exercise at least once while at work. She agreed, noting the plan for the coming weeks "feels doable." Overall, Alexandra Jones was receptive to today's appointment as evidenced by openness to sharing, responsiveness to feedback, and willingness to implement discussed strategies .  Mental Status Examination:  Appearance: well groomed and appropriate hygiene  Behavior: appropriate to circumstances Mood: euthymic Affect: mood congruent Speech: normal in rate, volume, and tone Eye Contact: appropriate Psychomotor Activity: unable to assess  Gait: unable to assess Thought Process: linear, logical, and goal directed  Thought Content/Perception: no hallucinations, delusions, bizarre thinking or behavior reported or observed and no evidence or endorsement of suicidal and homicidal ideation, plan, and intent Orientation: time, person, place, and purpose of appointment Memory/Concentration: memory, attention, language, and fund of knowledge intact  Insight/Judgment: fair  Interventions:  Conducted a brief chart review Provided empathic reflections and validation Provided positive reinforcement Employed supportive psychotherapy interventions to facilitate reduced distress and to improve coping skills with identified stressors Engaged patient in problem solving Psychoeducation provided regarding mindfulness  DSM-5 Diagnosis(es):  F41.1 Generalized Anxiety Disorder and F50.89 Other Specified Feeding or Eating Disorder, Emotional and Binge Eating Behaviors   Treatment Goal & Progress: During the initial appointment with this provider, the following treatment goal was established: increase coping skills. Alexandra Jones has demonstrated progress in her goal as evidenced by increased awareness of hunger patterns and increased awareness of triggers for emotional eating behaviors. Alexandra Jones also continues to  demonstrate willingness to engage in learned skill(s).  Plan: The next appointment will be scheduled in approximately three weeks, which will be via MyChart Video Visit. The next session  will focus on working towards the established treatment goal. Additionally, Alexandra Jones stated a plan to call [facility] after this appointment to schedule an initial appointment, as she has submitted all new patient paperwork.

## 2021-01-29 ENCOUNTER — Encounter (INDEPENDENT_AMBULATORY_CARE_PROVIDER_SITE_OTHER): Payer: Self-pay | Admitting: Bariatrics

## 2021-01-29 ENCOUNTER — Telehealth (INDEPENDENT_AMBULATORY_CARE_PROVIDER_SITE_OTHER): Payer: 59 | Admitting: Psychology

## 2021-01-29 ENCOUNTER — Ambulatory Visit (INDEPENDENT_AMBULATORY_CARE_PROVIDER_SITE_OTHER): Payer: 59 | Admitting: Bariatrics

## 2021-01-29 ENCOUNTER — Other Ambulatory Visit: Payer: Self-pay

## 2021-01-29 VITALS — BP 120/78 | HR 92 | Temp 98.3°F | Ht 65.0 in | Wt 249.0 lb

## 2021-01-29 DIAGNOSIS — Z9189 Other specified personal risk factors, not elsewhere classified: Secondary | ICD-10-CM

## 2021-01-29 DIAGNOSIS — F5089 Other specified eating disorder: Secondary | ICD-10-CM

## 2021-01-29 DIAGNOSIS — R7303 Prediabetes: Secondary | ICD-10-CM

## 2021-01-29 DIAGNOSIS — Z6841 Body Mass Index (BMI) 40.0 and over, adult: Secondary | ICD-10-CM

## 2021-01-29 DIAGNOSIS — F411 Generalized anxiety disorder: Secondary | ICD-10-CM | POA: Diagnosis not present

## 2021-01-29 DIAGNOSIS — E559 Vitamin D deficiency, unspecified: Secondary | ICD-10-CM

## 2021-01-29 MED ORDER — VITAMIN D (ERGOCALCIFEROL) 1.25 MG (50000 UNIT) PO CAPS: 50000.0000 [IU] | ORAL_CAPSULE | ORAL | 0 refills | Status: DC

## 2021-01-29 MED ORDER — OZEMPIC (0.25 OR 0.5 MG/DOSE) 2 MG/1.5ML ~~LOC~~ SOPN: 0.2500 mg | PEN_INJECTOR | SUBCUTANEOUS | 0 refills | Status: DC

## 2021-01-29 MED ORDER — METFORMIN HCL 500 MG PO TABS: 500.0000 mg | ORAL_TABLET | Freq: Every day | ORAL | 0 refills | Status: DC

## 2021-01-30 NOTE — Progress Notes (Signed)
Chief Complaint:   OBESITY Alexandra Jones is here to discuss her progress with her obesity treatment plan along with follow-up of her obesity related diagnoses. [name] is on the Category 3 Plan and states she is following her eating plan approximately 60% of the time. [name] states she is not currently exercising.  Today's visit was #: 8 Starting weight: 250 lbs Starting date: 10/01/2020 Today's weight: 249 lbs Today's date: 01/29/2021 Total lbs lost to date: 1 Total lbs lost since last in-office visit: 2  Interim History: [name] is down 2 lbs since her last visit. She could get in more water.   Subjective:   1. Vitamin D deficiency Harmony denies nausea, vomiting, and muscle weakness. She is on prescription Vit D.  2. Pre-diabetes [name] has no side effects of Metformin,  3. At risk for diabetes mellitus [name] is at higher than average risk for developing diabetes due to obesity and pre-diabetes.   Assessment/Plan:   1. Vitamin D deficiency Low Vitamin D level contributes to fatigue and are associated with obesity, breast, and colon cancer. She agrees to continue to take prescription Vitamin D @50 ,000 IU every week and will follow-up for routine testing of Vitamin D, at least 2-3 times per year to avoid over-replacement.  - Vitamin D, Ergocalciferol, (DRISDOL) 1.25 MG (50000 UNIT) CAPS capsule; Take 1 capsule (50,000 Units total) by mouth every 7 (seven) days.  Dispense: 4 capsule; Refill: 0  2. Pre-diabetes [name] will continue to work on weight loss, exercise, and decreasing simple carbohydrates to help decrease the risk of diabetes.   - metFORMIN (GLUCOPHAGE) 500 MG tablet; Take 1 tablet (500 mg total) by mouth daily with breakfast.  Dispense: 30 tablet; Refill: 0  - Semaglutide,0.25 or 0.5MG /DOS, (OZEMPIC, 0.25 OR 0.5 MG/DOSE,) 2 MG/1.5ML SOPN; Inject 0.25 mg into the skin once a week.  Dispense: 1.5 mL; Refill: 0  3. At risk for diabetes mellitus [name] was given approximately 15  minutes of diabetes education and counseling today. We discussed intensive lifestyle modifications today with an emphasis on weight loss as well as increasing exercise and decreasing simple carbohydrates in her diet. We also reviewed medication options with an emphasis on risk versus benefit of those discussed.   Repetitive spaced learning was employed today to elicit superior memory formation and behavioral change.  4. Obesity current BMI 41.5  [name] is currently in the action stage of change. As such, her goal is to continue with weight loss efforts. She has agreed to the Category 3 Plan.   Meal plan Will adhere closely to the plan Increase water Measure meat  Exercise goals:  Walk her dog and started with a personal trainer.  Behavioral modification strategies: increasing lean protein intake, decreasing simple carbohydrates, increasing vegetables, increasing water intake, decreasing eating out, no skipping meals, meal planning and cooking strategies, keeping healthy foods in the home, and planning for success.  [name] has agreed to follow-up with our clinic in 2 weeks, fasting. She was informed of the importance of frequent follow-up visits to maximize her success with intensive lifestyle modifications for her multiple health conditions.   Objective:   Blood pressure 120/78, pulse 92, temperature 98.3 F (36.8 C), height 5\' 5"  (1.651 m), weight 249 lb (112.9 kg), SpO2 96 %. Body mass index is 41.44 kg/m.  General: Cooperative, alert, well developed, in no acute distress. HEENT: Conjunctivae and lids unremarkable. Cardiovascular: Regular rhythm.  Lungs: Normal work of breathing. Neurologic: No focal deficits.   Lab Results  Component Value Date   CREATININE 0.66 10/01/2020   BUN 9 10/01/2020   NA 143 10/01/2020   K 4.2 10/01/2020   CL 104 10/01/2020   CO2 21 10/01/2020   Lab Results  Component Value Date   ALT 21 10/01/2020   AST 20 10/01/2020   ALKPHOS 60 10/01/2020    BILITOT 0.3 10/01/2020   Lab Results  Component Value Date   HGBA1C 5.8 (H) 10/01/2020   Lab Results  Component Value Date   INSULIN 14.7 10/01/2020   Lab Results  Component Value Date   TSH 1.890 10/01/2020   Lab Results  Component Value Date   CHOL 199 10/01/2020   HDL 42 10/01/2020   LDLCALC 125 (H) 10/01/2020   TRIG 182 (H) 10/01/2020   CHOLHDL 5.1 (H) 03/18/2019   Lab Results  Component Value Date   WBC 5.0 01/28/2016   HGB 13.6 01/28/2016   HCT 39.8 01/28/2016   MCV 87.7 01/28/2016   PLT 132 (L) 01/28/2016   No results found for: IRON, TIBC, FERRITIN  Attestation Statements:   Reviewed by clinician on day of visit: allergies, medications, problem list, medical history, surgical history, family history, social history, and previous encounter notes.  Coral Ceo, CMA, am acting as Location manager for [employer], DO.  I have reviewed the above documentation for accuracy and completeness, and I agree with the above. Jearld Lesch, DO

## 2021-02-02 ENCOUNTER — Encounter (INDEPENDENT_AMBULATORY_CARE_PROVIDER_SITE_OTHER): Payer: Self-pay | Admitting: Bariatrics

## 2021-02-04 ENCOUNTER — Encounter (INDEPENDENT_AMBULATORY_CARE_PROVIDER_SITE_OTHER): Payer: Self-pay | Admitting: Bariatrics

## 2021-02-04 NOTE — Progress Notes (Signed)
Office: [phone]  /  Fax: [phone]    Date: February 18, 2021   Appointment Start Time: 3:32pm Duration: 31 minutes Provider: Glennie Isle, Psy.D. Type of Session: Individual Therapy  Location of Patient: Home Location of Provider: Provider's home (private office) Type of Contact: Telepsychological Visit via MyChart Video Visit  Session Content: Alexandra Jones is a 25 y.o. female presenting for a follow-up appointment to address the previously established treatment goal of increasing coping skills. Today's appointment was a telepsychological visit due to COVID-19. Alexandra Jones provided verbal consent for today's telepsychological appointment and she is aware she is responsible for securing confidentiality on her end of the session. Prior to proceeding with today's appointment, Alexandra Jones's physical location at the time of this appointment was obtained as well a phone number she could be reached at in the event of technical difficulties. Alexandra Jones and this provider participated in today's telepsychological service.   This provider conducted a brief check-in. Alexandra Jones shared she moved into her new home, adding she has been busy with work. She indicated she planned a long weekend off coming up as she was experiencing burn out. Regarding eating, Alexandra Jones indicated she is eating snacks as previously discussed. She reported a reduction in emotional eating and binge eating behaviors. Positive reinforcement was provided. She reported she feels she has more of a routine, noting she also started working with a Physiological scientist.   Notably, Alexandra Jones reported she was a in a "funk" due to all the different changes, adding she felt it should not have taken this long to adjust. Thus, session focused on self-compassion and living congruent to identified values. Also, discussed the impact of recent stressors on eating habits. Overall, Alexandra Jones was receptive to today's appointment as evidenced by openness to sharing, responsiveness to feedback,  and willingness to continue engaging in learned skills.  Mental Status Examination:  Appearance: well groomed and appropriate hygiene  Behavior: appropriate to circumstances Mood: euthymic Affect: mood congruent Speech: normal in rate, volume, and tone Eye Contact: appropriate Psychomotor Activity: unable to assess Gait: unable to assess Thought Process: linear, logical, and goal directed  Thought Content/Perception: no hallucinations, delusions, bizarre thinking or behavior reported or observed and no evidence or endorsement of suicidal and homicidal ideation, plan, and intent Orientation: time, person, place, and purpose of appointment Memory/Concentration: memory, attention, language, and fund of knowledge intact  Insight/Judgment: good  Interventions:  Conducted a brief chart review Provided empathic reflections and validation Processed thoughts and feelings Provided positive reinforcement Employed supportive psychotherapy interventions to facilitate reduced distress, and to improve coping skills with identified stressors Employed insight oriented and cognitive psychotherapy interventions to identify and modify anxiety/mood producing thoughts, beliefs, and negative self-appraisals contributing to distress and emotional eating  DSM-5 Diagnosis(es):  F41.1 Generalized Anxiety Disorder and F50.89 Other Specified Feeding or Eating Disorder, Emotional and Binge Eating Behaviors  Treatment Goal & Progress: During the initial appointment with this provider, the following treatment goal was established: increase coping skills. Alexandra Jones has demonstrated progress in her goal as evidenced by increased awareness of hunger patterns and increased awareness of triggers for emotional eating behaviors. Alexandra Jones also continues to demonstrate willingness to engage in learned skill(s).  Plan: Alexandra Jones is scheduled for an initial appointment with Alexandra Jones on February 25, 2021, noting a follow-up  appointment has already been scheduled as well. As such, the next appointment with this provider will be scheduled in one month, which will be via MyChart Video Visit. The next session will focus on working towards the  established treatment goal.

## 2021-02-07 ENCOUNTER — Other Ambulatory Visit (INDEPENDENT_AMBULATORY_CARE_PROVIDER_SITE_OTHER): Payer: Self-pay | Admitting: Family Medicine

## 2021-02-07 DIAGNOSIS — R7303 Prediabetes: Secondary | ICD-10-CM

## 2021-02-07 NOTE — Telephone Encounter (Signed)
Last seen by Jearld Lesch

## 2021-02-18 ENCOUNTER — Telehealth (INDEPENDENT_AMBULATORY_CARE_PROVIDER_SITE_OTHER): Payer: 59 | Admitting: Psychology

## 2021-02-18 DIAGNOSIS — F5089 Other specified eating disorder: Secondary | ICD-10-CM

## 2021-02-18 DIAGNOSIS — F411 Generalized anxiety disorder: Secondary | ICD-10-CM | POA: Diagnosis not present

## 2021-02-25 ENCOUNTER — Ambulatory Visit (INDEPENDENT_AMBULATORY_CARE_PROVIDER_SITE_OTHER): Payer: 59 | Admitting: Psychologist

## 2021-02-25 DIAGNOSIS — F411 Generalized anxiety disorder: Secondary | ICD-10-CM

## 2021-02-26 ENCOUNTER — Encounter: Payer: 59 | Admitting: Family Medicine

## 2021-02-26 ENCOUNTER — Encounter (INDEPENDENT_AMBULATORY_CARE_PROVIDER_SITE_OTHER): Payer: Self-pay | Admitting: Family Medicine

## 2021-02-26 ENCOUNTER — Ambulatory Visit (INDEPENDENT_AMBULATORY_CARE_PROVIDER_SITE_OTHER): Payer: 59 | Admitting: Family Medicine

## 2021-02-26 ENCOUNTER — Other Ambulatory Visit: Payer: Self-pay

## 2021-02-26 VITALS — BP 111/67 | HR 67 | Temp 97.6°F | Ht 65.0 in | Wt 253.0 lb

## 2021-02-26 DIAGNOSIS — Z6841 Body Mass Index (BMI) 40.0 and over, adult: Secondary | ICD-10-CM

## 2021-02-26 DIAGNOSIS — E559 Vitamin D deficiency, unspecified: Secondary | ICD-10-CM | POA: Diagnosis not present

## 2021-02-26 DIAGNOSIS — E782 Mixed hyperlipidemia: Secondary | ICD-10-CM

## 2021-02-26 DIAGNOSIS — Z9189 Other specified personal risk factors, not elsewhere classified: Secondary | ICD-10-CM | POA: Diagnosis not present

## 2021-02-26 DIAGNOSIS — R7303 Prediabetes: Secondary | ICD-10-CM | POA: Diagnosis not present

## 2021-02-26 MED ORDER — METFORMIN HCL 500 MG PO TABS
500.0000 mg | ORAL_TABLET | Freq: Every day | ORAL | 0 refills | Status: DC
  Filled 2021-03-11: qty 30, 30d supply, fill #0

## 2021-02-26 MED ORDER — OZEMPIC (0.25 OR 0.5 MG/DOSE) 2 MG/1.5ML ~~LOC~~ SOPN
0.2500 mg | PEN_INJECTOR | SUBCUTANEOUS | 0 refills | Status: DC
  Filled 2021-03-11: qty 1.5, 56d supply, fill #0

## 2021-02-26 MED ORDER — VITAMIN D (ERGOCALCIFEROL) 1.25 MG (50000 UNIT) PO CAPS
50000.0000 [IU] | ORAL_CAPSULE | ORAL | 0 refills | Status: DC
  Filled 2021-03-11: qty 4, 28d supply, fill #0

## 2021-02-27 LAB — CMP14+EGFR
ALT: 20 IU/L (ref 0–32)
AST: 15 IU/L (ref 0–40)
Albumin/Globulin Ratio: 1.6 (ref 1.2–2.2)
Albumin: 4.6 g/dL (ref 3.9–5.0)
Alkaline Phosphatase: 59 IU/L (ref 44–121)
BUN/Creatinine Ratio: 17 (ref 9–23)
BUN: 13 mg/dL (ref 6–20)
Bilirubin Total: 0.2 mg/dL (ref 0.0–1.2)
CO2: 22 mmol/L (ref 20–29)
Calcium: 9.2 mg/dL (ref 8.7–10.2)
Chloride: 102 mmol/L (ref 96–106)
Creatinine, Ser: 0.77 mg/dL (ref 0.57–1.00)
Globulin, Total: 2.8 g/dL (ref 1.5–4.5)
Glucose: 97 mg/dL (ref 65–99)
Potassium: 4.5 mmol/L (ref 3.5–5.2)
Sodium: 140 mmol/L (ref 134–144)
Total Protein: 7.4 g/dL (ref 6.0–8.5)
eGFR: 108 mL/min/{1.73_m2} (ref >59)

## 2021-02-27 LAB — LIPID PANEL WITH LDL/HDL RATIO
Cholesterol, Total: 189 mg/dL (ref 100–199)
HDL: 41 mg/dL (ref >39)
LDL Chol Calc (NIH): 113 mg/dL — ABNORMAL HIGH (ref 0–99)
LDL/HDL Ratio: 2.8 ratio (ref 0.0–3.2)
Triglycerides: 198 mg/dL — ABNORMAL HIGH (ref 0–149)
VLDL Cholesterol Cal: 35 mg/dL (ref 5–40)

## 2021-02-27 LAB — INSULIN, RANDOM: INSULIN: 25.5 u[IU]/mL — ABNORMAL HIGH (ref 2.6–24.9)

## 2021-02-27 LAB — VITAMIN D 25 HYDROXY (VIT D DEFICIENCY, FRACTURES): Vit D, 25-Hydroxy: 38.8 ng/mL (ref 30.0–100.0)

## 2021-02-27 LAB — HEMOGLOBIN A1C
Est. average glucose Bld gHb Est-mCnc: 111 mg/dL
Hgb A1c MFr Bld: 5.5 % (ref 4.8–5.6)

## 2021-03-04 NOTE — Progress Notes (Signed)
Office: [phone]  /  Fax: [phone]    Date: March 18, 2021   Appointment Start Time: 4:01pm Duration: 34 minutes Provider: Glennie Jones, Psy.D. Type of Session: Individual Therapy  Location of Patient: Parked in car Location of Provider: Provider's home (private office) Type of Contact: Telepsychological Visit via [place] Video Visit  Session Content: Alexandra Jones is a 25 y.o. female presenting for a follow-up appointment to address the previously established treatment goal of increasing coping skills. Today's appointment was a telepsychological visit due to COVID-19. Alexandra Jones provided verbal consent for today's telepsychological appointment and she is aware she is responsible for securing confidentiality on her end of the session. Prior to proceeding with today's appointment, Alexandra Jones's physical location at the time of this appointment was obtained as well a phone number she could be reached at in the event of technical difficulties. Alexandra Jones and this provider participated in today's telepsychological service. Of note, today's appointment was switched to a regular telephone call at 4:04pm with Alexandra Jones's verbal consent due to technical issues.   This provider conducted a brief check-in. Alexandra Jones shared about ongoing worry about her father's well-being. She stated she continues to be busy with work. Alexandra Jones reported she met with a provider with [facility], adding, "I don't know how I feel about it right now." She stated their third appointment is scheduled on March 25, 2021. Associated thoughts and feelings were processed. Deshawn provided verbal consent for this provider to e-mail referral options. This provider discussed [email or web address] as well.   Regarding eating, Alexandra Jones noted she is having challenges "staying on track" and "being motivated" to eat congruent to her structured meal plan. Associated thoughts and feelings were processed. Psychoeducation provided regarding all or nothing  thinking. Provided. Psychoeducation regarding SMART goals was also provided and Alexandra Jones was engaged in goal setting. The following goal was established: Alexandra Jones will eat lunch congruent to her meal plan at least 6 out of 7 days a week between now and the next appointment with this provider. Overall, Alexandra Jones was receptive to today's appointment as evidenced by openness to sharing, responsiveness to feedback, and  willingness to work toward the established SMART goal .  Mental Status Examination:  Appearance: well groomed and appropriate hygiene  Behavior: appropriate to circumstances Mood: euthymic Affect: mood congruent Speech: normal in rate, volume, and tone Eye Contact: appropriate Psychomotor Activity: unable to assess Gait: unable to assess Thought Process: linear, logical, and goal directed  Thought Content/Perception: no hallucinations, delusions, bizarre thinking or behavior reported or observed and no evidence or endorsement of suicidal and homicidal ideation, plan, and intent Orientation: time, person, place, and purpose of appointment Memory/Concentration: memory, attention, language, and fund of knowledge intact  Insight/Judgment: fair  Interventions:  Conducted a brief chart review Provided empathic reflections and validation Engaged patient in goal setting Psychoeducation provided regarding SMART goals Psychoeducation provided regarding all or nothing thinking   DSM-5 Diagnosis(es):  F41.1 Generalized Anxiety Disorder and F50.89 Other Specified Feeding or Eating Disorder, Emotional and Binge Eating Behaviors   Treatment Goal & Progress: During the initial appointment with this provider, the following treatment goal was established: increase coping skills. Alexandra Jones has demonstrated progress in her goal as evidenced by increased awareness of hunger patterns, increased awareness of triggers for emotional eating behaviors, reduction in emotional eating behaviors , and reduction in binge  eating behaviors. Alexandra Jones also continues to demonstrate willingness to engage in learned skill(s).  Plan: The next appointment will be scheduled in three weeks, which will be via MyChart  Video Visit. The next session will focus on working towards the established treatment goal.

## 2021-03-05 ENCOUNTER — Ambulatory Visit (INDEPENDENT_AMBULATORY_CARE_PROVIDER_SITE_OTHER): Payer: 59 | Admitting: Psychologist

## 2021-03-05 DIAGNOSIS — F411 Generalized anxiety disorder: Secondary | ICD-10-CM | POA: Diagnosis not present

## 2021-03-06 ENCOUNTER — Other Ambulatory Visit (INDEPENDENT_AMBULATORY_CARE_PROVIDER_SITE_OTHER): Payer: Self-pay | Admitting: Bariatrics

## 2021-03-06 DIAGNOSIS — R7303 Prediabetes: Secondary | ICD-10-CM

## 2021-03-06 NOTE — Telephone Encounter (Signed)
Dr.Beasley 

## 2021-03-06 NOTE — Progress Notes (Signed)
Chief Complaint:   OBESITY Alexandra Jones is here to discuss her progress with her obesity treatment plan along with follow-up of her obesity related diagnoses. Alexandra Jones is on the Category 3 Plan and states she is following her eating plan approximately 50% of the time. Alexandra Jones states she is exercising with a trainer for 1 hour 3 times per week.   Today's visit was #: 9 Starting weight: 250 lbs Starting date: 10/01/2020 Today's weight: 253 lbs Today's date: 02/26/2021 Total lbs lost to date: 0 Total lbs lost since last in-office visit: 0  Interim History: Alexandra Jones has struggled with increased hunger recently. She has done well with exercise and trying to be mindful.  Subjective:   1. Mixed hyperlipidemia Alexandra Jones is working on diet and exercise, and she is due to have labs checked.  2. Vitamin D deficiency Alexandra Jones is stable on Vit D, and she is due for labs.  3. Pre-diabetes Alexandra Jones was started on Ozempic, but there was a pharmacy error and she has not been able to get it. She has been working on diet and exercise.  4. At risk for heart disease Alexandra Jones is at a higher than average risk for cardiovascular disease due to obesity.   Assessment/Plan:   1. Mixed hyperlipidemia Cardiovascular risk and specific lipid/LDL goals reviewed. We discussed several lifestyle modifications today. We will check labs today. Alexandra Jones will continue to work on diet, exercise and weight loss efforts. Orders and follow up as documented in patient record.   - Lipid Panel With LDL/HDL Ratio  2. Vitamin D deficiency Low Vitamin D level contributes to fatigue and are associated with obesity, breast, and colon cancer. We will check labs today, and we will refill prescription Vitamin D for 1 month. Alexandra Jones will follow-up for routine testing of Vitamin D, at least 2-3 times per year to avoid over-replacement.  - Vitamin D, Ergocalciferol, (DRISDOL) 1.25 MG (50000 UNIT) CAPS capsule; Take 1 capsule (50,000 Units total) by mouth every  7 (seven) days.  Dispense: 4 capsule; Refill: 0 - VITAMIN D 25 Hydroxy (Vit-D Deficiency, Fractures)  3. Pre-diabetes We will check labs today, and we will resend in the prescription for Ozempic and will contact the pharmacy as a prior authorization for her insurance as this should not have been required. We will refill both metformin and Ozempic for 1 month. Alexandra Jones will continue to work on weight loss, exercise, and decreasing simple carbohydrates to help decrease the risk of diabetes.   - metFORMIN (GLUCOPHAGE) 500 MG tablet; Take 1 tablet (500 mg total) by mouth daily with breakfast.  Dispense: 30 tablet; Refill: 0 - Semaglutide,0.25 or 0.5MG/DOS, (OZEMPIC, 0.25 OR 0.5 MG/DOSE,) 2 MG/1.5ML SOPN; Inject 0.25 mg into the skin once a week.  Dispense: 1.5 mL; Refill: 0 - CMP14+EGFR - Insulin, random - Hemoglobin A1c  4. At risk for heart disease Alexandra Jones was given approximately 15 minutes of coronary artery disease prevention counseling today. She is 25 y.o. female and has risk factors for heart disease including obesity. We discussed intensive lifestyle modifications today with an emphasis on specific weight loss instructions and strategies.   Repetitive spaced learning was employed today to elicit superior memory formation and behavioral change.  5. Obesity with current BMI 42.2 Alexandra Jones is currently in the action stage of change. As such, her goal is to continue with weight loss efforts. She has agreed to the Category 3 Plan.   Exercise goals: As is.  Behavioral modification strategies: increasing lean protein intake and  meal planning and cooking strategies.  Alexandra Jones has agreed to follow-up with our clinic in 2 to 3 weeks. She was informed of the importance of frequent follow-up visits to maximize her success with intensive lifestyle modifications for her multiple health conditions.   Alexandra Jones was informed we would discuss her lab results at her next visit unless there is a critical issue that needs  to be addressed sooner. Alexandra Jones agreed to keep her next visit at the agreed upon time to discuss these results.  Objective:   Blood pressure 111/67, pulse 67, temperature 97.6 F (36.4 C), height _0  (1.651 m), weight 253 lb (114.8 kg), SpO2 99 %. Body mass index is 42.1 kg/m.  General: Cooperative, alert, well developed, in no acute distress. HEENT: Conjunctivae and lids unremarkable. Cardiovascular: Regular rhythm.  Lungs: Normal work of breathing. Neurologic: No focal deficits.   Lab Results  Component Value Date   CREATININE 0.77 02/26/2021   BUN 13 02/26/2021   NA 140 02/26/2021   K 4.5 02/26/2021   CL 102 02/26/2021   CO2 22 02/26/2021   Lab Results  Component Value Date   ALT 20 02/26/2021   AST 15 02/26/2021   ALKPHOS 59 02/26/2021   BILITOT 0.2 02/26/2021   Lab Results  Component Value Date   HGBA1C 5.5 02/26/2021   HGBA1C 5.8 (H) 10/01/2020   Lab Results  Component Value Date   INSULIN 25.5 (H) 02/26/2021   INSULIN 14.7 10/01/2020   Lab Results  Component Value Date   TSH 1.890 10/01/2020   Lab Results  Component Value Date   CHOL 189 02/26/2021   HDL 41 02/26/2021   LDLCALC 113 (H) 02/26/2021   TRIG 198 (H) 02/26/2021   CHOLHDL 5.1 (H) 03/18/2019   Lab Results  Component Value Date   VD25OH 38.8 02/26/2021   VD25OH 25.0 (L) 10/01/2020   Lab Results  Component Value Date   WBC 5.0 01/28/2016   HGB 13.6 01/28/2016   HCT 39.8 01/28/2016   MCV 87.7 01/28/2016   PLT 132 (L) 01/28/2016   No results found for: IRON, TIBC, FERRITIN  Attestation Statements:   Reviewed by clinician on day of visit: allergies, medications, problem list, medical history, surgical history, family history, social history, and previous encounter notes.   I, Trixie Dredge, am acting as transcriptionist for Dennard Nip, MD.  I have reviewed the above documentation for accuracy and completeness, and I agree with the above. -  Dennard Nip, MD

## 2021-03-07 ENCOUNTER — Other Ambulatory Visit (HOSPITAL_COMMUNITY): Payer: Self-pay

## 2021-03-09 ENCOUNTER — Other Ambulatory Visit (HOSPITAL_COMMUNITY): Payer: Self-pay

## 2021-03-11 ENCOUNTER — Other Ambulatory Visit (HOSPITAL_COMMUNITY): Payer: Self-pay

## 2021-03-11 ENCOUNTER — Ambulatory Visit: Payer: 59 | Admitting: Psychologist

## 2021-03-12 ENCOUNTER — Ambulatory Visit (INDEPENDENT_AMBULATORY_CARE_PROVIDER_SITE_OTHER): Payer: 59 | Admitting: Bariatrics

## 2021-03-18 ENCOUNTER — Telehealth (INDEPENDENT_AMBULATORY_CARE_PROVIDER_SITE_OTHER): Payer: 59 | Admitting: Psychology

## 2021-03-18 DIAGNOSIS — F5089 Other specified eating disorder: Secondary | ICD-10-CM | POA: Diagnosis not present

## 2021-03-18 DIAGNOSIS — F411 Generalized anxiety disorder: Secondary | ICD-10-CM

## 2021-03-19 ENCOUNTER — Encounter: Payer: 59 | Admitting: Family Medicine

## 2021-03-25 NOTE — Progress Notes (Signed)
Office: [phone]  /  Fax: [phone]    Date: April 08, 2021   Appointment Start Time: 2:35pm Duration: 25 minutes Provider: Glennie Isle, Psy.D. Type of Session: Individual Therapy  Location of Patient: Home Location of Provider: Provider's Home (private office) Type of Contact: Telepsychological Visit via MyChart Video Visit  Session Content: This provider called Myya at 2:33pm as she did not present for today's appointment. She indicated she was "trying to login." Assistance was provided. As such, today's appointment was initiated 5 minutes late. Ceasia is a 25 y.o. female presenting for a follow-up appointment to address the previously established treatment goal of increasing coping skills. Today's appointment was a telepsychological visit due to COVID-19. Tyresa provided verbal consent for today's telepsychological appointment and she is aware she is responsible for securing confidentiality on her end of the session. Prior to proceeding with today's appointment, Celese's physical location at the time of this appointment was obtained as well a phone number she could be reached at in the event of technical difficulties. Kaly and this provider participated in today's telepsychological service. Of note, today's appointment was switched to a regular telephone call at 2:36pm with Chealsea's verbal consent due to technical issues.   This provider conducted a brief check-in. Jaquasha shared about ongoing stressful events, noting her father just had heart surgery. She shared she continues to "struggle" with making better food-related choices, adding motivation continues to wax and wane. Session focused further on the recommendation of traditional therapeutic services given ongoing stressors and symptoms of anxiety. She acknowledged she did not look at the referral list previously shared due to being busy. Anessa indicated she had an additional appointment with Dr. Michail Sermon ([place]),  adding she does not wish to continue with him as he is not a good fit. She was receptive to this provider placing a referral with [facility] to initiate services with a new provider.  Reviewed previously established SMART goal for lunch. Aisha indicated, "Most days it went fairly well;" however, she acknowledged she did not meet the goal. She indicated she likely reached 4 out of 7 days a week, which she described as an improvement. Melaney was receptive to increasing her goal to 5 out of 7 days a week for lunch. She indicated she continues do well with breakfast, while dinner continues to be a struggle. Despite, challenges with eating congruent to the meal plan, she continues to report an overall reduction in emotional and binge eating behaviors.   Remainder of session focused on increasing self-compassion. She was engaged in an exercise to increase self-compassion surrounding food related challenges. She was encouraged to ask herself regularly, "What do I need right now?" Overall, Omari was receptive to today's appointment as evidenced by openness to sharing, responsiveness to feedback, and  willingness to focus on increasing self-compassion .  Mental Status Examination:  Appearance: well groomed and appropriate hygiene  Behavior: appropriate to circumstances Mood: sad Affect: mood congruent Speech: normal in rate, volume, and tone Eye Contact: appropriate Psychomotor Activity: appropriate Gait: unable to assess Thought Process: linear, logical, and goal directed  Thought Content/Perception: no hallucinations, delusions, bizarre thinking or behavior reported or observed and no evidence or endorsement of suicidal and homicidal ideation, plan, and intent Orientation: time, person, place, and purpose of appointment Memory/Concentration: memory, attention, language, and fund of knowledge intact  Insight/Judgment: fair  Interventions:  Conducted a brief chart review Provided  empathic reflections and validation Reviewed content from the previous session Employed supportive psychotherapy  interventions to facilitate reduced distress and to improve coping skills with identified stressors Engaged patient in goal setting Psychoeducation provided regarding self-compassion Engaged patient in a self-compassion exercise  DSM-5 Diagnosis(es):  F41.1 Generalized Anxiety Disorder and F50.89 Other Specified Feeding or Eating Disorder, Emotional and Binge Eating Behaviors  Treatment Goal & Progress: During the initial appointment with this provider, the following treatment goal was established: increase coping skills. Sharmayne demonstrated progress in her goal as evidenced by increased awareness of hunger patterns, increased awareness of triggers for emotional eating behaviors, reduction in emotional eating behaviors , and reduction in binge eating behaviors. Lizbet also continues to demonstrate willingness to engage in learned skill(s).  Plan: Izabelle declined future appointments with this provider as she plans to initiate therapeutic services with a new provider to address current stressors/symptomatology aside from eating-related concerns. She acknowledged understanding that she may request a follow-up appointment with this provider in the future as long as she is still established with the clinic. No further follow-up planned by this provider.

## 2021-03-26 ENCOUNTER — Ambulatory Visit (INDEPENDENT_AMBULATORY_CARE_PROVIDER_SITE_OTHER): Payer: 59 | Admitting: Psychologist

## 2021-03-26 DIAGNOSIS — F411 Generalized anxiety disorder: Secondary | ICD-10-CM | POA: Diagnosis not present

## 2021-04-01 ENCOUNTER — Encounter (INDEPENDENT_AMBULATORY_CARE_PROVIDER_SITE_OTHER): Payer: Self-pay | Admitting: Family Medicine

## 2021-04-01 ENCOUNTER — Other Ambulatory Visit: Payer: Self-pay

## 2021-04-01 ENCOUNTER — Other Ambulatory Visit (HOSPITAL_COMMUNITY): Payer: Self-pay

## 2021-04-01 ENCOUNTER — Ambulatory Visit (INDEPENDENT_AMBULATORY_CARE_PROVIDER_SITE_OTHER): Payer: 59 | Admitting: Family Medicine

## 2021-04-01 VITALS — BP 119/73 | HR 88 | Temp 98.3°F | Ht 65.0 in | Wt 252.0 lb

## 2021-04-01 DIAGNOSIS — Z9189 Other specified personal risk factors, not elsewhere classified: Secondary | ICD-10-CM | POA: Diagnosis not present

## 2021-04-01 DIAGNOSIS — R7303 Prediabetes: Secondary | ICD-10-CM | POA: Diagnosis not present

## 2021-04-01 DIAGNOSIS — E559 Vitamin D deficiency, unspecified: Secondary | ICD-10-CM | POA: Diagnosis not present

## 2021-04-01 DIAGNOSIS — F3289 Other specified depressive episodes: Secondary | ICD-10-CM

## 2021-04-01 DIAGNOSIS — Z6841 Body Mass Index (BMI) 40.0 and over, adult: Secondary | ICD-10-CM | POA: Diagnosis not present

## 2021-04-01 MED ORDER — VITAMIN D (ERGOCALCIFEROL) 1.25 MG (50000 UNIT) PO CAPS
50000.0000 [IU] | ORAL_CAPSULE | ORAL | 0 refills | Status: DC
  Filled 2021-04-01: qty 4, 28d supply, fill #0

## 2021-04-01 MED ORDER — METFORMIN HCL 500 MG PO TABS
500.0000 mg | ORAL_TABLET | Freq: Every day | ORAL | 0 refills | Status: DC
  Filled 2021-04-01: qty 30, 30d supply, fill #0

## 2021-04-01 MED ORDER — ESCITALOPRAM OXALATE 10 MG PO TABS
10.0000 mg | ORAL_TABLET | Freq: Every evening | ORAL | 0 refills | Status: DC
  Filled 2021-04-01: qty 30, 30d supply, fill #0

## 2021-04-01 MED ORDER — OZEMPIC (0.25 OR 0.5 MG/DOSE) 2 MG/1.5ML ~~LOC~~ SOPN
0.2500 mg | PEN_INJECTOR | SUBCUTANEOUS | 0 refills | Status: DC
Start: 2021-04-01 — End: 2021-04-30
  Filled 2021-04-01: qty 1.5, 56d supply, fill #0

## 2021-04-01 NOTE — Progress Notes (Signed)
Chief Complaint:   OBESITY Alexandra Jones is here to discuss her progress with her obesity treatment plan along with follow-up of her obesity related diagnoses. Alexandra Jones is on the Category 3 Plan and states she is following her eating plan approximately 75% of the time. Alexandra Jones states she is doing strengthening and walking for 30-45 minutes 3 times per week.  Today's visit was #: 10 Starting weight: 250 lbs Starting date: 10/01/2020 Today's weight: 252 lbs Today's date: 04/01/2021 Total lbs lost to date: 0 Total lbs lost since last in-office visit: 1  Interim History: Alexandra Jones has been dealing with increased stress and this has made meal planning and prepping more difficult. She also notes increased emotional eating behaviors, but she was able to control this enough to avoid weight gain.  Subjective:   1. Pre-diabetes Alexandra Jones is stable on metformin, and her last A1c improved but her fasting insulin is elevated which is not uncommon in the early stages of weight loss. I discussed labs with the patient today.  2. Vitamin D deficiency Alexandra Jones is stable on Vit D prescription, and she denies nausea, vomiting, or muscle weakness.  3. Other depression with emotional eating Alexandra Jones notes increased stress at home and at work. She is on a low dose of Lexapro currently. She notes some increased emotional eating behaviors and decreased motivation while under increased stress. She denies suicidal or homicidal ideas.  4. At risk for diabetes mellitus Alexandra Jones is at higher than average risk for developing diabetes due to obesity.   Assessment/Plan:   1. Pre-diabetes [name] agreed to increase Ozempic to 0.5 mg q weekly with no refills, and we will refill metformin for 1 month. She will continue to work on weight loss, exercise, and decreasing simple carbohydrates to help decrease the risk of diabetes.   - metFORMIN (GLUCOPHAGE) 500 MG tablet; Take 1 tablet by mouth daily with breakfast.  Dispense: 30 tablet; Refill:  0 - Semaglutide,0.25 or 0.'5MG'$ /DOS, (OZEMPIC, 0.25 OR 0.5 MG/DOSE,) 2 MG/1.5ML SOPN; Inject 0.25 mg into the skin once a week.  Dispense: 1.5 mL; Refill: 0  2. Vitamin D deficiency Low Vitamin D level contributes to fatigue and are associated with obesity, breast, and colon cancer. We will refill prescription Vitamin D for 1 month. [name] will follow-up for routine testing of Vitamin D, at least 2-3 times per year to avoid over-replacement.  - Vitamin D, Ergocalciferol, (DRISDOL) 1.25 MG (50000 UNIT) CAPS capsule; Take 1 capsule  by mouth every 7 days.  Dispense: 4 capsule; Refill: 0  3. Other depression with emotional eating Behavior modification techniques were discussed today to help Alexandra Jones deal with her emotional/non-hunger eating behaviors. Alexandra Jones agreed to increase Lexapro to 10 mg q PM with no refills. Orders and follow up as documented in patient record.   - escitalopram (LEXAPRO) 10 MG tablet; Take 1 tablet (10 mg total) by mouth every evening.  Dispense: 30 tablet; Refill: 0  4. At risk for diabetes mellitus [name] was given approximately 15 minutes of diabetes education and counseling today. We discussed intensive lifestyle modifications today with an emphasis on weight loss as well as increasing exercise and decreasing simple carbohydrates in her diet. We also reviewed medication options with an emphasis on risk versus benefit of those discussed.   Repetitive spaced learning was employed today to elicit superior memory formation and behavioral change.  5. Obesity with current BMI 42.0 Alexandra Jones is currently in the action stage of change. As such, her goal is to continue with  weight loss efforts. She has agreed to the Category 3 Plan.   Damage control strategies were discussed.  Exercise goals: As is.  Behavioral modification strategies: decreasing eating out and meal planning and cooking strategies.  Alexandra Jones has agreed to follow-up with our clinic in 2 to 3 weeks. She was informed of the  importance of frequent follow-up visits to maximize her success with intensive lifestyle modifications for her multiple health conditions.   Objective:   Blood pressure 119/73, pulse 88, temperature 98.3 F (36.8 C), height '5\' 5"'$  (1.651 m), weight 252 lb (114.3 kg), SpO2 96 %. Body mass index is 41.93 kg/m.  General: Cooperative, alert, well developed, in no acute distress. HEENT: Conjunctivae and lids unremarkable. Cardiovascular: Regular rhythm.  Lungs: Normal work of breathing. Neurologic: No focal deficits.   Lab Results  Component Value Date   CREATININE 0.77 02/26/2021   BUN 13 02/26/2021   NA 140 02/26/2021   K 4.5 02/26/2021   CL 102 02/26/2021   CO2 22 02/26/2021   Lab Results  Component Value Date   ALT 20 02/26/2021   AST 15 02/26/2021   ALKPHOS 59 02/26/2021   BILITOT 0.2 02/26/2021   Lab Results  Component Value Date   HGBA1C 5.5 02/26/2021   HGBA1C 5.8 (H) 10/01/2020   Lab Results  Component Value Date   INSULIN 25.5 (H) 02/26/2021   INSULIN 14.7 10/01/2020   Lab Results  Component Value Date   TSH 1.890 10/01/2020   Lab Results  Component Value Date   CHOL 189 02/26/2021   HDL 41 02/26/2021   LDLCALC 113 (H) 02/26/2021   TRIG 198 (H) 02/26/2021   CHOLHDL 5.1 (H) 03/18/2019   Lab Results  Component Value Date   VD25OH 38.8 02/26/2021   VD25OH 25.0 (L) 10/01/2020   Lab Results  Component Value Date   WBC 5.0 01/28/2016   HGB 13.6 01/28/2016   HCT 39.8 01/28/2016   MCV 87.7 01/28/2016   PLT 132 (L) 01/28/2016   No results found for: IRON, TIBC, FERRITIN  Attestation Statements:   Reviewed by clinician on day of visit: allergies, medications, problem list, medical history, surgical history, family history, social history, and previous encounter notes.   I, Trixie Dredge, am acting as transcriptionist for Dennard Nip, MD.  I have reviewed the above documentation for accuracy and completeness, and I agree with the above. -  Dennard Nip, MD

## 2021-04-02 ENCOUNTER — Other Ambulatory Visit (HOSPITAL_COMMUNITY): Payer: Self-pay

## 2021-04-04 ENCOUNTER — Other Ambulatory Visit (HOSPITAL_COMMUNITY): Payer: Self-pay

## 2021-04-08 ENCOUNTER — Telehealth (INDEPENDENT_AMBULATORY_CARE_PROVIDER_SITE_OTHER): Payer: 59 | Admitting: Psychology

## 2021-04-08 DIAGNOSIS — F5089 Other specified eating disorder: Secondary | ICD-10-CM

## 2021-04-08 DIAGNOSIS — F411 Generalized anxiety disorder: Secondary | ICD-10-CM

## 2021-04-30 ENCOUNTER — Other Ambulatory Visit (HOSPITAL_COMMUNITY): Payer: Self-pay

## 2021-04-30 ENCOUNTER — Ambulatory Visit (INDEPENDENT_AMBULATORY_CARE_PROVIDER_SITE_OTHER): Payer: 59 | Admitting: Family Medicine

## 2021-04-30 ENCOUNTER — Other Ambulatory Visit: Payer: Self-pay

## 2021-04-30 ENCOUNTER — Encounter (INDEPENDENT_AMBULATORY_CARE_PROVIDER_SITE_OTHER): Payer: Self-pay | Admitting: Family Medicine

## 2021-04-30 VITALS — BP 112/71 | HR 76 | Temp 98.1°F | Ht 65.0 in | Wt 252.0 lb

## 2021-04-30 DIAGNOSIS — G4700 Insomnia, unspecified: Secondary | ICD-10-CM

## 2021-04-30 DIAGNOSIS — E559 Vitamin D deficiency, unspecified: Secondary | ICD-10-CM | POA: Diagnosis not present

## 2021-04-30 DIAGNOSIS — F3289 Other specified depressive episodes: Secondary | ICD-10-CM

## 2021-04-30 DIAGNOSIS — Z9189 Other specified personal risk factors, not elsewhere classified: Secondary | ICD-10-CM

## 2021-04-30 DIAGNOSIS — Z6841 Body Mass Index (BMI) 40.0 and over, adult: Secondary | ICD-10-CM

## 2021-04-30 DIAGNOSIS — R7303 Prediabetes: Secondary | ICD-10-CM | POA: Diagnosis not present

## 2021-04-30 MED ORDER — METFORMIN HCL 500 MG PO TABS
500.0000 mg | ORAL_TABLET | Freq: Every day | ORAL | 0 refills | Status: DC
  Filled 2021-04-30: qty 30, 30d supply, fill #0

## 2021-04-30 MED ORDER — VITAMIN D (ERGOCALCIFEROL) 1.25 MG (50000 UNIT) PO CAPS
50000.0000 [IU] | ORAL_CAPSULE | ORAL | 0 refills | Status: DC
  Filled 2021-04-30: qty 4, 28d supply, fill #0

## 2021-04-30 MED ORDER — ESCITALOPRAM OXALATE 10 MG PO TABS
10.0000 mg | ORAL_TABLET | Freq: Every evening | ORAL | 0 refills | Status: DC
Start: 2021-04-30 — End: 2021-05-20
  Filled 2021-04-30: qty 30, 30d supply, fill #0

## 2021-04-30 MED ORDER — SEMAGLUTIDE (1 MG/DOSE) 4 MG/3ML ~~LOC~~ SOPN
1.0000 mg | PEN_INJECTOR | SUBCUTANEOUS | 0 refills | Status: DC
  Filled 2021-04-30: qty 3, 28d supply, fill #0

## 2021-04-30 NOTE — Progress Notes (Signed)
Chief Complaint:   OBESITY Alexandra Jones is here to discuss her progress with her obesity treatment plan along with follow-up of her obesity related diagnoses. Alexandra Jones is on the Category 3 Plan and states she is following her eating plan approximately 50% of the time. [name] states she is doing strengthening and walking for 30 minutes 3 times per week.  Today's visit was #: 11 Starting weight: 250 lbs Starting date: 10/01/2020 Today's weight: 252 lbs Today's date: 04/30/2021 Total lbs lost to date: 0 Total lbs lost since last in-office visit: 0  Interim History: [name] continues to work on weight loss. She struggles to follow her breakfast, especially as she is very limited in her ability to meal plan in the morning.  Subjective:   1. Vitamin D deficiency Alexandra Jones is stable on Vit D, and she is due for a refill.  2. Pre-diabetes Alexandra Jones is stable on metformin and Ozempic. She notes mild decrease in polyphagia, but not much.  3. Insomnia, unspecified type Alexandra Jones is sleeping 5-6 hours per night, and she walking frequently and she notes some daytime somnolence.   4. Other depression with emotional eating Alexandra Jones's mood is stable on Lexapro, and she requests a refill.  5. At risk for diabetes mellitus Alexandra Jones is at higher than average risk for developing diabetes due to obesity.   Assessment/Plan:   1. Vitamin D deficiency Low Vitamin D level contributes to fatigue and are associated with obesity, breast, and colon cancer. We will refill prescription Vitamin D 50,000 IU every week #4 for 1 month. Alexandra Jones and will follow-up for routine testing of Vitamin D, at least 2-3 times per year to avoid over-replacement.  2. Pre-diabetes We will refill metformin 500 mg q AM #30 for 1 month. Alexandra Jones agreed to increase Ozempic to 1 mg q weekly with no refills. She will continue to work on weight loss, exercise, and decreasing simple carbohydrates to help decrease the risk of diabetes.   3. Insomnia, unspecified  type [name] agreed to start OTC melatonin 5-10 mg qhs and we will follow up at her next visit.  4. Other depression with emotional eating Behavior modification techniques were discussed today to help [name] deal with her emotional/non-hunger eating behaviors. We will refill Lexapro 10 mg qhs #30 for 1 month. Orders and follow up as documented in patient record.   5. At risk for diabetes mellitus Alexandra Jones was given approximately 15 minutes of diabetes education and counseling today. We discussed intensive lifestyle modifications today with an emphasis on weight loss as well as increasing exercise and decreasing simple carbohydrates in her diet. We also reviewed medication options with an emphasis on risk versus benefit of those discussed.   Repetitive spaced learning was employed today to elicit superior memory formation and behavioral change.  6. Obesity with current BMI 42.0 Alexandra Jones is currently in the action stage of change. As such, her goal is to continue with weight loss efforts. She has agreed to the Category 3 Plan and keeping a food journal and adhering to recommended goals of 300-400 calories and 25+ grams of protein at breakfast daily.   Exercise goals: As is.  Behavioral modification strategies: increasing lean protein intake and meal planning and cooking strategies.  Alexandra Jones has agreed to follow-up with our clinic in 3 weeks. She was informed of the importance of frequent follow-up visits to maximize her success with intensive lifestyle modifications for her multiple health conditions.   Objective:   Blood pressure 112/71, pulse 76, temperature 98.1  F (36.7 C), height 5\' 5"  (1.651 m), weight 252 lb (114.3 kg), SpO2 96 %. Body mass index is 41.93 kg/m.  General: Cooperative, alert, well developed, in no acute distress. HEENT: Conjunctivae and lids unremarkable. Cardiovascular: Regular rhythm.  Lungs: Normal work of breathing. Neurologic: No focal deficits.   Lab Results   Component Value Date   CREATININE 0.77 02/26/2021   BUN 13 02/26/2021   NA 140 02/26/2021   K 4.5 02/26/2021   CL 102 02/26/2021   CO2 22 02/26/2021   Lab Results  Component Value Date   ALT 20 02/26/2021   AST 15 02/26/2021   ALKPHOS 59 02/26/2021   BILITOT 0.2 02/26/2021   Lab Results  Component Value Date   HGBA1C 5.5 02/26/2021   HGBA1C 5.8 (H) 10/01/2020   Lab Results  Component Value Date   INSULIN 25.5 (H) 02/26/2021   INSULIN 14.7 10/01/2020   Lab Results  Component Value Date   TSH 1.890 10/01/2020   Lab Results  Component Value Date   CHOL 189 02/26/2021   HDL 41 02/26/2021   LDLCALC 113 (H) 02/26/2021   TRIG 198 (H) 02/26/2021   CHOLHDL 5.1 (H) 03/18/2019   Lab Results  Component Value Date   VD25OH 38.8 02/26/2021   VD25OH 25.0 (L) 10/01/2020   Lab Results  Component Value Date   WBC 5.0 01/28/2016   HGB 13.6 01/28/2016   HCT 39.8 01/28/2016   MCV 87.7 01/28/2016   PLT 132 (L) 01/28/2016   No results found for: IRON, TIBC, FERRITIN  Attestation Statements:   Reviewed by clinician on day of visit: allergies, medications, problem list, medical history, surgical history, family history, social history, and previous encounter notes.   I, Trixie Dredge, am acting as Location manager for Dennard Nip, MD.  I have reviewed the above documentation for accuracy and completeness, and I agree with the above. -  Dennard Nip, MD

## 2021-05-07 ENCOUNTER — Other Ambulatory Visit: Payer: Self-pay | Admitting: Family Medicine

## 2021-05-07 DIAGNOSIS — I10 Essential (primary) hypertension: Secondary | ICD-10-CM

## 2021-05-08 ENCOUNTER — Other Ambulatory Visit (HOSPITAL_COMMUNITY): Payer: Self-pay

## 2021-05-08 MED ORDER — AMLODIPINE BESYLATE 5 MG PO TABS
ORAL_TABLET | ORAL | 0 refills | Status: DC
  Filled 2021-05-08: qty 45, 30d supply, fill #0

## 2021-05-20 ENCOUNTER — Other Ambulatory Visit (HOSPITAL_COMMUNITY): Payer: Self-pay

## 2021-05-20 ENCOUNTER — Encounter (INDEPENDENT_AMBULATORY_CARE_PROVIDER_SITE_OTHER): Payer: Self-pay | Admitting: Family Medicine

## 2021-05-20 ENCOUNTER — Other Ambulatory Visit: Payer: Self-pay

## 2021-05-20 ENCOUNTER — Ambulatory Visit (INDEPENDENT_AMBULATORY_CARE_PROVIDER_SITE_OTHER): Payer: 59 | Admitting: Family Medicine

## 2021-05-20 VITALS — BP 129/84 | HR 80 | Temp 98.2°F | Ht 65.0 in | Wt 252.0 lb

## 2021-05-20 DIAGNOSIS — F3289 Other specified depressive episodes: Secondary | ICD-10-CM

## 2021-05-20 DIAGNOSIS — E66813 Obesity, class 3: Secondary | ICD-10-CM

## 2021-05-20 DIAGNOSIS — R7303 Prediabetes: Secondary | ICD-10-CM | POA: Diagnosis not present

## 2021-05-20 DIAGNOSIS — E559 Vitamin D deficiency, unspecified: Secondary | ICD-10-CM | POA: Diagnosis not present

## 2021-05-20 DIAGNOSIS — Z6841 Body Mass Index (BMI) 40.0 and over, adult: Secondary | ICD-10-CM | POA: Diagnosis not present

## 2021-05-20 DIAGNOSIS — Z9189 Other specified personal risk factors, not elsewhere classified: Secondary | ICD-10-CM

## 2021-05-20 MED ORDER — VITAMIN D (ERGOCALCIFEROL) 1.25 MG (50000 UNIT) PO CAPS
50000.0000 [IU] | ORAL_CAPSULE | ORAL | 0 refills | Status: DC
  Filled 2021-05-20: qty 4, 28d supply, fill #0

## 2021-05-20 MED ORDER — ESCITALOPRAM OXALATE 10 MG PO TABS
10.0000 mg | ORAL_TABLET | Freq: Every evening | ORAL | 0 refills | Status: DC
  Filled 2021-05-20: qty 30, 30d supply, fill #0

## 2021-05-20 MED ORDER — SEMAGLUTIDE (2 MG/DOSE) 8 MG/3ML ~~LOC~~ SOPN
2.0000 mg | PEN_INJECTOR | SUBCUTANEOUS | 0 refills | Status: DC
  Filled 2021-05-20: qty 6, 30d supply, fill #0
  Filled 2021-06-05: qty 3, 28d supply, fill #0

## 2021-05-20 MED ORDER — METFORMIN HCL 500 MG PO TABS
500.0000 mg | ORAL_TABLET | Freq: Every day | ORAL | 0 refills | Status: DC
  Filled 2021-05-20: qty 30, 30d supply, fill #0

## 2021-05-21 ENCOUNTER — Other Ambulatory Visit (HOSPITAL_COMMUNITY): Payer: Self-pay

## 2021-05-21 ENCOUNTER — Encounter: Payer: Self-pay | Admitting: Family Medicine

## 2021-05-21 ENCOUNTER — Ambulatory Visit (INDEPENDENT_AMBULATORY_CARE_PROVIDER_SITE_OTHER): Payer: 59 | Admitting: Family Medicine

## 2021-05-21 VITALS — BP 127/84 | HR 91 | Temp 98.3°F | Ht 65.0 in | Wt 256.2 lb

## 2021-05-21 DIAGNOSIS — L309 Dermatitis, unspecified: Secondary | ICD-10-CM

## 2021-05-21 DIAGNOSIS — Z111 Encounter for screening for respiratory tuberculosis: Secondary | ICD-10-CM | POA: Diagnosis not present

## 2021-05-21 DIAGNOSIS — Z0001 Encounter for general adult medical examination with abnormal findings: Secondary | ICD-10-CM

## 2021-05-21 DIAGNOSIS — I10 Essential (primary) hypertension: Secondary | ICD-10-CM | POA: Diagnosis not present

## 2021-05-21 DIAGNOSIS — Z0000 Encounter for general adult medical examination without abnormal findings: Secondary | ICD-10-CM

## 2021-05-21 DIAGNOSIS — Z23 Encounter for immunization: Secondary | ICD-10-CM | POA: Diagnosis not present

## 2021-05-21 MED ORDER — AMLODIPINE BESYLATE 5 MG PO TABS
7.5000 mg | ORAL_TABLET | Freq: Every day | ORAL | 3 refills | Status: DC
Start: 2021-05-21 — End: 2021-12-24
  Filled 2021-05-21 – 2021-06-05 (×2): qty 135, 90d supply, fill #0
  Filled 2021-09-03: qty 135, 90d supply, fill #1
  Filled 2021-12-01: qty 135, 90d supply, fill #2

## 2021-05-21 MED ORDER — TRIAMCINOLONE ACETONIDE 0.1 % EX CREA
1.0000 "application " | TOPICAL_CREAM | Freq: Two times a day (BID) | CUTANEOUS | 1 refills | Status: DC
  Filled 2021-05-21: qty 80, 30d supply, fill #0

## 2021-05-21 NOTE — Progress Notes (Signed)
Chief Complaint:   OBESITY Alexandra Jones is here to discuss her progress with her obesity treatment plan along with follow-up of her obesity related diagnoses. Alexandra Jones is on the Category 3 Plan and keeping a food journal and adhering to recommended goals of 300-400 calories and 25+ grams of protein daily and states she is following her eating plan approximately 50% of the time. Alexandra Jones states she is training, and walk for 30 minutes 5 times per week.  Today's visit was #: 12 Starting weight: 250 lbs Starting date: 10/01/2020 Today's weight: 252 lbs Today's date: 05/20/2021 Total lbs lost to date: 0 Total lbs lost since last in-office visit: 0  Interim History: Alexandra Jones has been on vacation and she has done well avoiding weight gain. She did well increasing her protein and planning on healthy snacks.   Subjective:   1. Pre-diabetes Alexandra Jones is stable on her medications, and she notes some hypoglycemia when her protein decreases but this has improved. She still notes some polyphagia.  2. Vitamin D deficiency Alexandra Jones is stable on Vit D, but her level is not yet at goal.  3. Other depression with emotional eating Alexandra Jones is stable on Lexapro. She notes feeling anxious when her glucose drops, but this is likely due more to hypoglycemia.  4. At risk for impaired metabolic function Alexandra Jones is at increased risk for impaired metabolic function if calories or protein decreases.  Assessment/Plan:   1. Pre-diabetes Alexandra Jones agreed to increase Ozempic to 2 mg (ok to slowly increase to 50 clicks first which is about 1.5 mg); and we will refill metformin for 1 month. She will continue to work on weight loss, exercise, and decreasing simple carbohydrates to help decrease the risk of diabetes.   - metFORMIN (GLUCOPHAGE) 500 MG tablet; Take 1 tablet by mouth daily with breakfast.  Dispense: 30 tablet; Refill: 0 - Semaglutide, 2 MG/DOSE, 8 MG/3ML SOPN; Inject 2 mg as directed once a week.  Dispense: 6 mL; Refill:  0  2. Vitamin D deficiency Low Vitamin D level contributes to fatigue and are associated with obesity, breast, and colon cancer. We will refill prescription Vitamin D 50,000 IU every week, and we will recheck labs in 1 months. Alexandra Jones will follow-up for routine testing of Vitamin D, at least 2-3 times per year to avoid over-replacement.  - Vitamin D, Ergocalciferol, (DRISDOL) 1.25 MG (50000 UNIT) CAPS capsule; Take 1 capsule by mouth every 7 days.  Dispense: 4 capsule; Refill: 0  3. Other depression with emotional eating Behavior modification techniques were discussed today to help Alexandra Jones deal with her emotional/non-hunger eating behaviors. We will refill Lexapro for 1 month. Orders and follow up as documented in patient record.   - escitalopram (LEXAPRO) 10 MG tablet; Take 1 tablet by mouth every evening.  Dispense: 30 tablet; Refill: 0  4. At risk for impaired metabolic function Alexandra Jones was given approximately 15 minutes of impaired  metabolic function prevention counseling today. We discussed intensive lifestyle modifications today with an emphasis on specific nutrition and exercise instructions and strategies.   Repetitive spaced learning was employed today to elicit superior memory formation and behavioral change.  5. Obesity with current BMI 42.0 Alexandra Jones is currently in the action stage of change. As such, her goal is to continue with weight loss efforts. She has agreed to the Category 3 Plan or keeping a food journal and adhering to recommended goals of 1300-1500 calories and 80+ grams of protein daily.   Exercise goals: As is.  Behavioral  modification strategies: increasing lean protein intake, meal planning and cooking strategies, and travel eating strategies.  Adela has agreed to follow-up with our clinic in 2 to 3 weeks. She was informed of the importance of frequent follow-up visits to maximize her success with intensive lifestyle modifications for her multiple health conditions.    Objective:   Blood pressure 129/84, pulse 80, temperature 98.2 F (36.8 C), height 5\' 5"  (1.651 m), weight 252 lb (114.3 kg), SpO2 97 %. Body mass index is 41.93 kg/m.  General: Cooperative, alert, well developed, in no acute distress. HEENT: Conjunctivae and lids unremarkable. Cardiovascular: Regular rhythm.  Lungs: Normal work of breathing. Neurologic: No focal deficits.   Lab Results  Component Value Date   CREATININE 0.77 02/26/2021   BUN 13 02/26/2021   NA 140 02/26/2021   K 4.5 02/26/2021   CL 102 02/26/2021   CO2 22 02/26/2021   Lab Results  Component Value Date   ALT 20 02/26/2021   AST 15 02/26/2021   ALKPHOS 59 02/26/2021   BILITOT 0.2 02/26/2021   Lab Results  Component Value Date   HGBA1C 5.5 02/26/2021   HGBA1C 5.8 (H) 10/01/2020   Lab Results  Component Value Date   INSULIN 25.5 (H) 02/26/2021   INSULIN 14.7 10/01/2020   Lab Results  Component Value Date   TSH 1.890 10/01/2020   Lab Results  Component Value Date   CHOL 189 02/26/2021   HDL 41 02/26/2021   LDLCALC 113 (H) 02/26/2021   TRIG 198 (H) 02/26/2021   CHOLHDL 5.1 (H) 03/18/2019   Lab Results  Component Value Date   VD25OH 38.8 02/26/2021   VD25OH 25.0 (L) 10/01/2020   Lab Results  Component Value Date   WBC 5.0 01/28/2016   HGB 13.6 01/28/2016   HCT 39.8 01/28/2016   MCV 87.7 01/28/2016   PLT 132 (L) 01/28/2016   No results found for: IRON, TIBC, FERRITIN  Attestation Statements:   Reviewed by clinician on day of visit: allergies, medications, problem list, medical history, surgical history, family history, social history, and previous encounter notes.   I, Alexandra Jones, am acting as transcriptionist for Alexandra Nip, MD.  I have reviewed the above documentation for accuracy and completeness, and I agree with the above. -  Alexandra Nip, MD

## 2021-05-21 NOTE — Progress Notes (Signed)
Assessment & Plan:  1. Well adult exam Preventive health education provided.  - TB Skin Test - patient to return in 48-72 hour window to have test read.   2. Essential hypertension Well controlled on current regimen.  - amLODipine (NORVASC) 5 MG tablet; Take 1 and 1/2 tablets (7.5 mg total) by mouth daily.  Dispense: 135 tablet; Refill: 3  3. Dermatitis - triamcinolone cream (KENALOG) 0.1 %; Apply 1 application topically 2 (two) times daily.  Dispense: 80 g; Refill: 1  4. Need for immunization against influenza - influenza vaccine given in office today   Follow-up: Return in about 1 year (around 05/21/2022) for annual physical.   Hendricks Limes, MSN, APRN, FNP-C [facility]  Subjective:  Patient ID: Alexandra Jones, female    DOB: [birth year]  Age: 25 y.o. MRN: [id number]  Patient Care Team: Loman Brooklyn, FNP as PCP - General (Family Medicine) Danie Binder, MD (Inactive) as Consulting Physician (Gastroenterology) Jonnie Kind, MD (Inactive) as Consulting Physician (Obstetrics and Gynecology)   CC:  Chief Complaint  Patient presents with   Annual Exam    HPI Alexandra Jones presents for her annual physical.  Occupation: [employer], Marital status: Single, Substance use: None Diet: diet plan by [employer] [facility], Exercise: Personal trainer 3x/week Last eye exam: Does not go Last dental exam: Last month Last colonoscopy: 2018 Last pap smear: 2019 - has upcoming appointment with GYN Hepatitis C Screening: Declined Immunizations: Flu Vaccine:  will get today Tdap Vaccine: up to date  COVID-19 Vaccine: up to date  DEPRESSION SCREENING PHQ 2/9 Scores 10/01/2020 09/21/2020 04/12/2020 11/14/2019 03/16/2019 01/13/2018  PHQ - 2 Score 5 2 0 0 0 0  PHQ- 9 Score '18 6 8 ' - - -     Review of Systems  Constitutional:  Negative for chills, fever and weight loss.  HENT:  Negative for congestion, ear discharge, ear pain, nosebleeds, sinus pain, sore  throat and tinnitus.   Eyes:  Negative for blurred vision, double vision, pain, discharge and redness.  Respiratory:  Negative for cough, shortness of breath and wheezing.   Cardiovascular:  Negative for chest pain, palpitations and leg swelling.  Gastrointestinal:  Negative for abdominal pain, constipation, diarrhea, heartburn, nausea and vomiting.  Genitourinary:  Negative for dysuria, frequency and urgency.  Musculoskeletal:  Negative for myalgias.  Skin:  Negative for rash.  Neurological:  Negative for dizziness, seizures, weakness and headaches.  Psychiatric/Behavioral:  Negative for depression, substance abuse and suicidal ideas. The patient is not nervous/anxious.     Current Outpatient Medications:    acetaminophen (TYLENOL) 500 MG tablet, Take 500 mg by mouth every 6 (six) hours as needed., Disp: , Rfl:    amLODipine (NORVASC) 5 MG tablet, TAKE 1 & 1/2 TABLETS BY MOUTH DAILY., Disp: 45 tablet, Rfl: 0   Biotin 5 MG CAPS, Take by mouth., Disp: , Rfl:    calcium carbonate (TUMS - DOSED IN MG ELEMENTAL CALCIUM) 500 MG chewable tablet, Chew 1 tablet by mouth daily., Disp: , Rfl:    Emollient (COLLAGEN EX), Apply topically. 1  scoop, Disp: , Rfl:    escitalopram (LEXAPRO) 10 MG tablet, Take 1 tablet by mouth every evening., Disp: 30 tablet, Rfl: 0   famotidine (PEPCID) 20 MG tablet, Take 20 mg by mouth as needed. , Disp: , Rfl:    ibuprofen (ADVIL) 400 MG tablet, Take 400 mg by mouth every 6 (six) hours as needed., Disp: , Rfl:  loratadine (CLARITIN) 10 MG tablet, Take 10 mg by mouth daily., Disp: , Rfl:    Melatonin-Pyridoxine (MELATONIN TR) 5-10 MG TBCR, Take by mouth., Disp: , Rfl:    metFORMIN (GLUCOPHAGE) 500 MG tablet, Take 1 tablet by mouth daily with breakfast., Disp: 30 tablet, Rfl: 0   Multiple Vitamin (MULTI-VITAMINS) TABS, Take 1 tablet by mouth., Disp: , Rfl:    Semaglutide, 2 MG/DOSE, 8 MG/3ML SOPN, Inject 2 mg as directed once a week., Disp: 6 mL, Rfl: 0   Vitamin D,  Ergocalciferol, (DRISDOL) 1.25 MG (50000 UNIT) CAPS capsule, Take 1 capsule by mouth every 7 days., Disp: 4 capsule, Rfl: 0  Allergies  Allergen Reactions   Hydrocodone Hives   Penicillins Rash    fever Has patient had a PCN reaction causing immediate rash, facial/tongue/throat swelling, SOB or lightheadedness with hypotension: Yes Has patient had a PCN reaction causing severe rash involving mucus membranes or skin necrosis: No Has patient had a PCN reaction that required hospitalization: No Has patient had a PCN reaction occurring within the last 10 years: Yes If all of the above answers are "NO", then may proceed with Cephalosporin use.     Past Medical History:  Diagnosis Date   Anxiety disorder 03/14/2019   Last Assessment & Plan:  Controlled with Lexapro 5 mg PO QD.   Diarrhea 05/01/2017   GERD (gastroesophageal reflux disease)    High blood pressure    Lactose intolerance     Past Surgical History:  Procedure Laterality Date   CHOLECYSTECTOMY     COLONOSCOPY N/A 06/22/2017   Procedure: COLONOSCOPY;  Surgeon: Danie Binder, MD;  Location: [facility];  Service: Endoscopy;  Laterality: N/A;  1:45pm   wisdom teeth extraction      Family History  Problem Relation Age of Onset   High blood pressure Mother    High Cholesterol Mother    Thyroid disease Mother    Obesity Mother    Prostate cancer Father    High blood pressure Father    High Cholesterol Father    Depression Father    Obesity Father    Hypertension Maternal Grandmother    Breast cancer Maternal Grandmother 87   Kidney failure Maternal Grandmother    Colon cancer Maternal Grandfather 42   Heart failure Maternal Grandfather    Prostate cancer Maternal Grandfather    Celiac disease Neg Hx    Crohn's disease Neg Hx    Ulcerative colitis Neg Hx     Social History   Socioeconomic History   Marital status: Single    Spouse name: Not on file   Number of children: Not on file   Years of education: Not  on file   Highest education level: Not on file  Occupational History   Occupation: Occupational Therapist  Tobacco Use   Smoking status: Never   Smokeless tobacco: Never  Vaping Use   Vaping Use: Never used  Substance and Sexual Activity   Alcohol use: Yes    Alcohol/week: 0.0 standard drinks    Comment: occasional   Drug use: No   Sexual activity: Yes    Birth control/protection: I.U.D.  Other Topics Concern   Not on file  Social History Narrative   Not on file   Social Determinants of Health   Financial Resource Strain: Not on file  Food Insecurity: Not on file  Transportation Needs: Not on file  Physical Activity: Not on file  Stress: Not on file  Social Connections: Not  on file  Intimate Partner Violence: Not on file      Objective:    BP 127/84   Pulse 91   Temp 98.3 F (36.8 C) (Temporal)   Ht '5\' 5"'  (1.651 m)   Wt 256 lb 3.2 oz (116.2 kg)   SpO2 93%   BMI 42.63 kg/m   Wt Readings from Last 3 Encounters:  05/21/21 256 lb 3.2 oz (116.2 kg)  05/20/21 252 lb (114.3 kg)  04/30/21 252 lb (114.3 kg)    Physical Exam Vitals reviewed.  Constitutional:      General: She is not in acute distress.    Appearance: Normal appearance. She is morbidly obese. She is not ill-appearing, toxic-appearing or diaphoretic.  HENT:     Head: Normocephalic and atraumatic.     Right Ear: Tympanic membrane, ear canal and external ear normal. There is no impacted cerumen.     Left Ear: Tympanic membrane, ear canal and external ear normal. There is no impacted cerumen.     Nose: Nose normal. No congestion or rhinorrhea.     Mouth/Throat:     Mouth: Mucous membranes are moist.     Pharynx: Oropharynx is clear. No oropharyngeal exudate or posterior oropharyngeal erythema.  Eyes:     General: No scleral icterus.       Right eye: No discharge.        Left eye: No discharge.     Conjunctiva/sclera: Conjunctivae normal.     Pupils: Pupils are equal, round, and reactive to light.   Cardiovascular:     Rate and Rhythm: Normal rate and regular rhythm.     Heart sounds: Normal heart sounds. No murmur heard.   No friction rub. No gallop.  Pulmonary:     Effort: Pulmonary effort is normal. No respiratory distress.     Breath sounds: Normal breath sounds. No stridor. No wheezing, rhonchi or rales.  Abdominal:     General: Abdomen is flat. Bowel sounds are normal. There is no distension.     Palpations: Abdomen is soft. There is no hepatomegaly, splenomegaly or mass.     Tenderness: There is no abdominal tenderness. There is no guarding or rebound.     Hernia: No hernia is present.  Musculoskeletal:        General: Normal range of motion.     Cervical back: Normal range of motion and neck supple. No rigidity. No muscular tenderness.  Lymphadenopathy:     Cervical: No cervical adenopathy.  Skin:    General: Skin is warm and dry.     Capillary Refill: Capillary refill takes less than 2 seconds.     Comments: Raised skin lesion to left elbow.  Neurological:     General: No focal deficit present.     Mental Status: She is alert and oriented to person, place, and time. Mental status is at baseline.  Psychiatric:        Mood and Affect: Mood normal.        Behavior: Behavior normal.        Thought Content: Thought content normal.        Judgment: Judgment normal.    Lab Results  Component Value Date   TSH 1.890 10/01/2020   Lab Results  Component Value Date   WBC 5.0 01/28/2016   HGB 13.6 01/28/2016   HCT 39.8 01/28/2016   MCV 87.7 01/28/2016   PLT 132 (L) 01/28/2016   Lab Results  Component Value Date   NA 140 02/26/2021  K 4.5 02/26/2021   CO2 22 02/26/2021   GLUCOSE 97 02/26/2021   BUN 13 02/26/2021   CREATININE 0.77 02/26/2021   BILITOT 0.2 02/26/2021   ALKPHOS 59 02/26/2021   AST 15 02/26/2021   ALT 20 02/26/2021   PROT 7.4 02/26/2021   ALBUMIN 4.6 02/26/2021   CALCIUM 9.2 02/26/2021   ANIONGAP 7 01/28/2016   EGFR 108 02/26/2021   Lab  Results  Component Value Date   CHOL 189 02/26/2021   Lab Results  Component Value Date   HDL 41 02/26/2021   Lab Results  Component Value Date   LDLCALC 113 (H) 02/26/2021   Lab Results  Component Value Date   TRIG 198 (H) 02/26/2021   Lab Results  Component Value Date   CHOLHDL 5.1 (H) 03/18/2019   Lab Results  Component Value Date   HGBA1C 5.5 02/26/2021

## 2021-05-24 ENCOUNTER — Other Ambulatory Visit: Payer: Self-pay

## 2021-05-24 ENCOUNTER — Ambulatory Visit: Payer: 59

## 2021-05-24 ENCOUNTER — Other Ambulatory Visit (HOSPITAL_COMMUNITY): Payer: Self-pay

## 2021-05-24 DIAGNOSIS — Z111 Encounter for screening for respiratory tuberculosis: Secondary | ICD-10-CM

## 2021-05-24 LAB — TB SKIN TEST
Induration: 0 mm
TB Skin Test: NEGATIVE

## 2021-05-24 NOTE — Progress Notes (Signed)
Patient here to have TB skin test read.  Results are negative.

## 2021-06-06 ENCOUNTER — Other Ambulatory Visit (HOSPITAL_COMMUNITY): Payer: Self-pay

## 2021-06-11 ENCOUNTER — Other Ambulatory Visit (HOSPITAL_COMMUNITY): Payer: Self-pay

## 2021-06-11 ENCOUNTER — Other Ambulatory Visit: Payer: Self-pay

## 2021-06-11 ENCOUNTER — Ambulatory Visit (INDEPENDENT_AMBULATORY_CARE_PROVIDER_SITE_OTHER): Payer: 59 | Admitting: Family Medicine

## 2021-06-11 ENCOUNTER — Encounter (INDEPENDENT_AMBULATORY_CARE_PROVIDER_SITE_OTHER): Payer: Self-pay | Admitting: Family Medicine

## 2021-06-11 VITALS — BP 118/76 | HR 90 | Temp 97.6°F | Ht 65.0 in | Wt 254.0 lb

## 2021-06-11 DIAGNOSIS — E559 Vitamin D deficiency, unspecified: Secondary | ICD-10-CM | POA: Diagnosis not present

## 2021-06-11 DIAGNOSIS — Z6841 Body Mass Index (BMI) 40.0 and over, adult: Secondary | ICD-10-CM | POA: Diagnosis not present

## 2021-06-11 DIAGNOSIS — Z9189 Other specified personal risk factors, not elsewhere classified: Secondary | ICD-10-CM

## 2021-06-11 DIAGNOSIS — F3289 Other specified depressive episodes: Secondary | ICD-10-CM

## 2021-06-11 DIAGNOSIS — R7303 Prediabetes: Secondary | ICD-10-CM

## 2021-06-11 MED ORDER — ESCITALOPRAM OXALATE 10 MG PO TABS
10.0000 mg | ORAL_TABLET | Freq: Every evening | ORAL | 0 refills | Status: DC
  Filled 2021-06-11: qty 30, 30d supply, fill #0

## 2021-06-11 MED ORDER — METFORMIN HCL 500 MG PO TABS
500.0000 mg | ORAL_TABLET | Freq: Every day | ORAL | 0 refills | Status: DC
  Filled 2021-06-11: qty 30, 30d supply, fill #0

## 2021-06-11 MED ORDER — SEMAGLUTIDE (2 MG/DOSE) 8 MG/3ML ~~LOC~~ SOPN
2.0000 mg | PEN_INJECTOR | SUBCUTANEOUS | 0 refills | Status: DC
  Filled 2021-06-11: qty 6, 56d supply, fill #0

## 2021-06-11 MED ORDER — VITAMIN D (ERGOCALCIFEROL) 1.25 MG (50000 UNIT) PO CAPS
50000.0000 [IU] | ORAL_CAPSULE | ORAL | 0 refills | Status: DC
  Filled 2021-06-11: qty 4, 28d supply, fill #0

## 2021-06-11 NOTE — Progress Notes (Signed)
Chief Complaint:   OBESITY Alexandra Jones is here to discuss her progress with her obesity treatment plan along with follow-up of her obesity related diagnoses. Alexandra Jones is on the Category 3 Plan or keeping a food journal and adhering to recommended goals of 1300-1500 calories and 80+ grams of protein daily and states she is following her eating plan approximately 50% of the time. Alexandra Jones states she is doing 0 minutes 0 times per week.  Today's visit was #: 13 Starting weight: 250 lbs Starting date: 10/01/2020 Today's weight: 254 lbs Today's date: 06/11/2021 Total lbs lost to date: 0 Total lbs lost since last in-office visit: 0  Interim History: Alexandra Jones has had some GI issues and she hasn't been able to follow her plan as closely. She is open to looking at journaling.  Subjective:   1. Pre-diabetes Alexandra Jones increased Ozempic to 1.5 mg and she has significant GI upset, so she has decreased back to 1 mg and she is doing better now.  2. Vitamin D deficiency Alexandra Jones is on Vit D, and she denies nausea or vomiting.  3. Other depression with emotional eating Alexandra Jones is doing well on Lexapro. She is working on decreasing emotional eating behaviors. She denies suicidal ideations.  4. At risk for diabetes mellitus Alexandra Jones is at higher than average risk for developing diabetes due to obesity.   Assessment/Plan:   1. Pre-diabetes Alexandra Jones will continue to work on weight loss, exercise, and decreasing simple carbohydrates to help decrease the risk of diabetes. We will refill Ozempic for 1 month (she is to increase Ozempic to 1.2 mg and see how she tolerates this for 1 month), and we will refill metformin for 1 month. We will recheck labs at her next visit.  - metFORMIN (GLUCOPHAGE) 500 MG tablet; Take 1 tablet by mouth daily with breakfast.  Dispense: 30 tablet; Refill: 0 - Semaglutide, 2 MG/DOSE, 8 MG/3ML SOPN; Inject 2 mg once a week as directed.  Dispense: 6 mL; Refill: 0  2. Vitamin D deficiency Low Vitamin D  level contributes to fatigue and are associated with obesity, breast, and colon cancer. We will refill prescription Vitamin D for 1 month. We will recheck labs at her next visit, and Alexandra Jones will follow-up for routine testing of Vitamin D, at least 2-3 times per year to avoid over-replacement.  - Vitamin D, Ergocalciferol, (DRISDOL) 1.25 MG (50000 UNIT) CAPS capsule; Take 1 capsule by mouth every 7 days.  Dispense: 4 capsule; Refill: 0  3. Other depression with emotional eating Behavior modification techniques were discussed today to help Alexandra Jones deal with her emotional/non-hunger eating behaviors. We will refill Lexapro for 1 month. Orders and follow up as documented in patient record.   - escitalopram (LEXAPRO) 10 MG tablet; Take 1 tablet by mouth every evening.  Dispense: 30 tablet; Refill: 0  4. At risk for diabetes mellitus Alexandra Jones was given approximately 15 minutes of diabetes education and counseling today. We discussed intensive lifestyle modifications today with an emphasis on weight loss as well as increasing exercise and decreasing simple carbohydrates in her diet. We also reviewed medication options with an emphasis on risk versus benefit of those discussed.   Repetitive spaced learning was employed today to elicit superior memory formation and behavioral change.  5. Obesity with currently BMI of 42.3 Alexandra Jones is currently in the action stage of change. As such, her goal is to continue with weight loss efforts. She has agreed to change to keeping a food journal and adhering to recommended  goals of 1300-1500 calories and 80+ grams of protein daily.   We will recheck fasting labs at her next visit.  Behavioral modification strategies: increasing lean protein intake and no skipping meals.  Alexandra Jones has agreed to follow-up with our clinic in 2 to 3 weeks. She was informed of the importance of frequent follow-up visits to maximize her success with intensive lifestyle modifications for her multiple  health conditions.   Objective:   Blood pressure 118/76, pulse 90, temperature 97.6 F (36.4 C), height 5\' 5"  (1.651 m), weight 254 lb (115.2 kg), SpO2 98 %. Body mass index is 42.27 kg/m.  General: Cooperative, alert, well developed, in no acute distress. HEENT: Conjunctivae and lids unremarkable. Cardiovascular: Regular rhythm.  Lungs: Normal work of breathing. Neurologic: No focal deficits.   Lab Results  Component Value Date   CREATININE 0.77 02/26/2021   BUN 13 02/26/2021   NA 140 02/26/2021   K 4.5 02/26/2021   CL 102 02/26/2021   CO2 22 02/26/2021   Lab Results  Component Value Date   ALT 20 02/26/2021   AST 15 02/26/2021   ALKPHOS 59 02/26/2021   BILITOT 0.2 02/26/2021   Lab Results  Component Value Date   HGBA1C 5.5 02/26/2021   HGBA1C 5.8 (H) 10/01/2020   Lab Results  Component Value Date   INSULIN 25.5 (H) 02/26/2021   INSULIN 14.7 10/01/2020   Lab Results  Component Value Date   TSH 1.890 10/01/2020   Lab Results  Component Value Date   CHOL 189 02/26/2021   HDL 41 02/26/2021   LDLCALC 113 (H) 02/26/2021   TRIG 198 (H) 02/26/2021   CHOLHDL 5.1 (H) 03/18/2019   Lab Results  Component Value Date   VD25OH 38.8 02/26/2021   VD25OH 25.0 (L) 10/01/2020   Lab Results  Component Value Date   WBC 5.0 01/28/2016   HGB 13.6 01/28/2016   HCT 39.8 01/28/2016   MCV 87.7 01/28/2016   PLT 132 (L) 01/28/2016   No results found for: IRON, TIBC, FERRITIN  Attestation Statements:   Reviewed by clinician on day of visit: allergies, medications, problem list, medical history, surgical history, family history, social history, and previous encounter notes.   I, Trixie Dredge, am acting as transcriptionist for Dennard Nip, MD.  I have reviewed the above documentation for accuracy and completeness, and I agree with the above. -  Dennard Nip, MD

## 2021-06-21 ENCOUNTER — Other Ambulatory Visit (HOSPITAL_COMMUNITY): Payer: Self-pay

## 2021-06-21 ENCOUNTER — Other Ambulatory Visit: Payer: Self-pay

## 2021-06-21 ENCOUNTER — Ambulatory Visit: Payer: 59 | Admitting: Family Medicine

## 2021-06-21 VITALS — BP 133/88 | HR 95 | Temp 98.5°F | Ht 65.0 in | Wt 259.4 lb

## 2021-06-21 DIAGNOSIS — L989 Disorder of the skin and subcutaneous tissue, unspecified: Secondary | ICD-10-CM | POA: Diagnosis not present

## 2021-06-21 DIAGNOSIS — M79602 Pain in left arm: Secondary | ICD-10-CM | POA: Diagnosis not present

## 2021-06-21 MED ORDER — PREDNISONE 10 MG (21) PO TBPK
ORAL_TABLET | ORAL | 0 refills | Status: DC
  Filled 2021-06-21: qty 21, 6d supply, fill #0

## 2021-06-21 NOTE — Progress Notes (Signed)
Assessment & Plan:  1. Pain of left upper extremity Education provided on distal bicep tendinitis rehab for patient to complete at home. - predniSONE (STERAPRED UNI-PAK 21 TAB) 10 MG (21) TBPK tablet; Take as directed per package directions for 6 days  Dispense: 21 tablet; Refill: 0  2. Skin lesion of left arm Offered referral to dermatologist as I am unsure what her skin lesions are - patient declined at this time since it does not bother her. Lesions made worse with steroids per patient. Does not look fungal. Reassurance provided to patient that it does not look like psoriasis and that steroids would have helped if it was.   Return if symptoms worsen or fail to improve.  Hendricks Limes, MSN, APRN, FNP-C [facility]  Subjective:    Patient ID: Alexandra Jones, female    DOB: 1996-03-16, 25 y.o.   MRN: [id number]  Patient Care Team: Loman Brooklyn, FNP as PCP - General (Family Medicine) Danie Binder, MD (Inactive) as Consulting Physician (Gastroenterology) Jonnie Kind, MD (Inactive) as Consulting Physician (Obstetrics and Gynecology)   Chief Complaint:  Chief Complaint  Patient presents with   Arm Pain    Left x 1 month     HPI: Alexandra Jones is a 25 y.o. female presenting on 06/21/2021 for Arm Pain (Left x 1 month )  Patient c/o left arm pain that has been ongoing for the past month but worsened over the past 2 weeks. Pain is worsened when she straightens the arm or tries to pronate the arm. No known injury. She has been using massage, taking Ibuprofen, and rotating head/ice.   New complaints: Patient reports the spots on her left elbow are no better. Previously she was given a steroid cream, which she states made it worse. Denies pain, itching, and irritation. Patient questioning if it is psoriasis.   Social history:  Relevant past medical, surgical, family and social history reviewed and updated as indicated. Interim medical history since  our last visit reviewed.  Allergies and medications reviewed and updated.  DATA REVIEWED: CHART IN EPIC  ROS: Negative unless specifically indicated above in HPI.    Current Outpatient Medications:    acetaminophen (TYLENOL) 500 MG tablet, Take 500 mg by mouth every 6 (six) hours as needed., Disp: , Rfl:    amLODipine (NORVASC) 5 MG tablet, Take 1 and 1/2 tablets (7.5 mg total) by mouth daily., Disp: 135 tablet, Rfl: 3   Biotin 5 MG CAPS, Take by mouth., Disp: , Rfl:    calcium carbonate (TUMS - DOSED IN MG ELEMENTAL CALCIUM) 500 MG chewable tablet, Chew 1 tablet by mouth daily., Disp: , Rfl:    Emollient (COLLAGEN EX), Apply topically. 1  scoop, Disp: , Rfl:    escitalopram (LEXAPRO) 10 MG tablet, Take 1 tablet by mouth every evening., Disp: 30 tablet, Rfl: 0   famotidine (PEPCID) 20 MG tablet, Take 20 mg by mouth as needed. , Disp: , Rfl:    ibuprofen (ADVIL) 400 MG tablet, Take 400 mg by mouth every 6 (six) hours as needed., Disp: , Rfl:    loratadine (CLARITIN) 10 MG tablet, Take 10 mg by mouth daily., Disp: , Rfl:    Melatonin-Pyridoxine (MELATONIN TR) 5-10 MG TBCR, Take by mouth., Disp: , Rfl:    metFORMIN (GLUCOPHAGE) 500 MG tablet, Take 1 tablet by mouth daily with breakfast., Disp: 30 tablet, Rfl: 0   Multiple Vitamin (MULTI-VITAMINS) TABS, Take 1 tablet by mouth., Disp: ,  Rfl:    Semaglutide, 2 MG/DOSE, 8 MG/3ML SOPN, Inject 2 mg once a week as directed., Disp: 6 mL, Rfl: 0   triamcinolone cream (KENALOG) 0.1 %, Apply 1 application topically 2 (two) times daily., Disp: 80 g, Rfl: 1   Vitamin D, Ergocalciferol, (DRISDOL) 1.25 MG (50000 UNIT) CAPS capsule, Take 1 capsule by mouth every 7 days., Disp: 4 capsule, Rfl: 0   Allergies  Allergen Reactions   Hydrocodone Hives   Penicillins Rash    fever Has patient had a PCN reaction causing immediate rash, facial/tongue/throat swelling, SOB or lightheadedness with hypotension: Yes Has patient had a PCN reaction causing severe rash  involving mucus membranes or skin necrosis: No Has patient had a PCN reaction that required hospitalization: No Has patient had a PCN reaction occurring within the last 10 years: Yes If all of the above answers are "NO", then may proceed with Cephalosporin use.    Past Medical History:  Diagnosis Date   Anxiety disorder 03/14/2019   Last Assessment & Plan:  Controlled with Lexapro 5 mg PO QD.   Diarrhea 05/01/2017   GERD (gastroesophageal reflux disease)    High blood pressure    Lactose intolerance     Past Surgical History:  Procedure Laterality Date   CHOLECYSTECTOMY     COLONOSCOPY N/A 06/22/2017   Procedure: COLONOSCOPY;  Surgeon: Danie Binder, MD;  Location: [facility];  Service: Endoscopy;  Laterality: N/A;  1:45pm   wisdom teeth extraction      Social History   Socioeconomic History   Marital status: Single    Spouse name: Not on file   Number of children: Not on file   Years of education: Not on file   Highest education level: Not on file  Occupational History   Occupation: Occupational Therapist  Tobacco Use   Smoking status: Never   Smokeless tobacco: Never  Vaping Use   Vaping Use: Never used  Substance and Sexual Activity   Alcohol use: Yes    Alcohol/week: 0.0 standard drinks    Comment: occasional   Drug use: No   Sexual activity: Yes    Birth control/protection: I.U.D.  Other Topics Concern   Not on file  Social History Narrative   Not on file   Social Determinants of Health   Financial Resource Strain: Not on file  Food Insecurity: Not on file  Transportation Needs: Not on file  Physical Activity: Not on file  Stress: Not on file  Social Connections: Not on file  Intimate Partner Violence: Not on file        Objective:    BP 133/88   Pulse 95   Temp 98.5 F (36.9 C) (Temporal)   Ht _0  (1.651 m)   Wt 259 lb 6.4 oz (117.7 kg)   BMI 43.17 kg/m   Wt Readings from Last 3 Encounters:  06/21/21 259 lb 6.4 oz (117.7 kg)   06/11/21 254 lb (115.2 kg)  05/21/21 256 lb 3.2 oz (116.2 kg)    Physical Exam Vitals reviewed.  Constitutional:      General: She is not in acute distress.    Appearance: Normal appearance. She is not ill-appearing, toxic-appearing or diaphoretic.  HENT:     Head: Normocephalic and atraumatic.  Eyes:     General: No scleral icterus.       Right eye: No discharge.        Left eye: No discharge.     Conjunctiva/sclera: Conjunctivae normal.  Cardiovascular:  Rate and Rhythm: Normal rate.  Pulmonary:     Effort: Pulmonary effort is normal. No respiratory distress.  Musculoskeletal:        General: Normal range of motion.     Left upper arm: Tenderness present. No swelling, edema, deformity or lacerations.     Left forearm: Tenderness present. No swelling, edema, deformity or lacerations.       Arms:     Cervical back: Normal range of motion.     Comments: Areas of pain with straightening and pronation.  Skin:    General: Skin is warm and dry.     Capillary Refill: Capillary refill takes less than 2 seconds.     Findings: Rash (raised skin colored lesions of left arm near elbow) present.  Neurological:     General: No focal deficit present.     Mental Status: She is alert and oriented to person, place, and time. Mental status is at baseline.  Psychiatric:        Mood and Affect: Mood normal.        Behavior: Behavior normal.        Thought Content: Thought content normal.        Judgment: Judgment normal.    Lab Results  Component Value Date   TSH 1.890 10/01/2020   Lab Results  Component Value Date   WBC 5.0 01/28/2016   HGB 13.6 01/28/2016   HCT 39.8 01/28/2016   MCV 87.7 01/28/2016   PLT 132 (L) 01/28/2016   Lab Results  Component Value Date   NA 140 02/26/2021   K 4.5 02/26/2021   CO2 22 02/26/2021   GLUCOSE 97 02/26/2021   BUN 13 02/26/2021   CREATININE 0.77 02/26/2021   BILITOT 0.2 02/26/2021   ALKPHOS 59 02/26/2021   AST 15 02/26/2021   ALT 20  02/26/2021   PROT 7.4 02/26/2021   ALBUMIN 4.6 02/26/2021   CALCIUM 9.2 02/26/2021   ANIONGAP 7 01/28/2016   EGFR 108 02/26/2021   Lab Results  Component Value Date   CHOL 189 02/26/2021   Lab Results  Component Value Date   HDL 41 02/26/2021   Lab Results  Component Value Date   LDLCALC 113 (H) 02/26/2021   Lab Results  Component Value Date   TRIG 198 (H) 02/26/2021   Lab Results  Component Value Date   CHOLHDL 5.1 (H) 03/18/2019   Lab Results  Component Value Date   HGBA1C 5.5 02/26/2021

## 2021-06-23 ENCOUNTER — Encounter: Payer: Self-pay | Admitting: Family Medicine

## 2021-06-24 ENCOUNTER — Ambulatory Visit (INDEPENDENT_AMBULATORY_CARE_PROVIDER_SITE_OTHER): Payer: 59 | Admitting: Family Medicine

## 2021-06-24 ENCOUNTER — Encounter (INDEPENDENT_AMBULATORY_CARE_PROVIDER_SITE_OTHER): Payer: Self-pay | Admitting: Family Medicine

## 2021-06-24 ENCOUNTER — Other Ambulatory Visit (HOSPITAL_COMMUNITY): Payer: Self-pay

## 2021-06-24 ENCOUNTER — Other Ambulatory Visit: Payer: Self-pay

## 2021-06-24 VITALS — BP 123/73 | HR 68 | Temp 98.1°F | Ht 65.0 in | Wt 255.0 lb

## 2021-06-24 DIAGNOSIS — R7303 Prediabetes: Secondary | ICD-10-CM | POA: Diagnosis not present

## 2021-06-24 DIAGNOSIS — E559 Vitamin D deficiency, unspecified: Secondary | ICD-10-CM | POA: Diagnosis not present

## 2021-06-24 DIAGNOSIS — E782 Mixed hyperlipidemia: Secondary | ICD-10-CM

## 2021-06-24 DIAGNOSIS — Z6841 Body Mass Index (BMI) 40.0 and over, adult: Secondary | ICD-10-CM | POA: Diagnosis not present

## 2021-06-24 DIAGNOSIS — F3289 Other specified depressive episodes: Secondary | ICD-10-CM

## 2021-06-24 DIAGNOSIS — Z9189 Other specified personal risk factors, not elsewhere classified: Secondary | ICD-10-CM | POA: Diagnosis not present

## 2021-06-24 MED ORDER — ESCITALOPRAM OXALATE 10 MG PO TABS
10.0000 mg | ORAL_TABLET | Freq: Every evening | ORAL | 0 refills | Status: DC
  Filled 2021-06-24: qty 30, 30d supply, fill #0

## 2021-06-24 MED ORDER — SEMAGLUTIDE (2 MG/DOSE) 8 MG/3ML ~~LOC~~ SOPN
2.0000 mg | PEN_INJECTOR | SUBCUTANEOUS | 0 refills | Status: DC
  Filled 2021-06-24: qty 6, 56d supply, fill #0

## 2021-06-24 MED ORDER — METFORMIN HCL 500 MG PO TABS
500.0000 mg | ORAL_TABLET | Freq: Every day | ORAL | 0 refills | Status: DC
  Filled 2021-06-24: qty 30, 30d supply, fill #0

## 2021-06-24 MED ORDER — VITAMIN D (ERGOCALCIFEROL) 1.25 MG (50000 UNIT) PO CAPS
50000.0000 [IU] | ORAL_CAPSULE | ORAL | 0 refills | Status: DC
Start: 2021-06-24 — End: 2021-07-15
  Filled 2021-06-24: qty 4, 28d supply, fill #0

## 2021-06-24 NOTE — Progress Notes (Signed)
Chief Complaint:   OBESITY Alexandra Jones is here to discuss her progress with her obesity treatment plan along with follow-up of her obesity related diagnoses. Alexandra Jones is on keeping a food journal and adhering to recommended goals of 1300-1500 calories and 80+ grams of protein daily and states she is following her eating plan approximately 50% of the time. Alexandra Jones states she is doing 0 minutes 0 times per week.  Today's visit was #: 14 Starting weight: 250 lbs Starting date: 10/01/2020 Today's weight: 255 lbs Today's date: 06/24/2021 Total lbs lost to date: 0 Total lbs lost since last in-office visit: 0  Interim History: Alexandra Jones has struggled to lose weight, especially with meal planning and prepping. She is especially busy at work. She will be working on Thanksgiving weekend, and she doesn't feel like this will be a challenge.  Subjective:   1. Mixed hyperlipidemia Alexandra Jones is not on statin, and she is working on diet and exercise.  2. Pre-diabetes Alexandra Jones is on metformin and Ozempic, and she is due for labs. She continue to struggle with weight loss.  3. Vitamin D deficiency Alexandra Jones is on Vit D, and she denies signs of over-replacement.  4. Other depression with emotional eating Alexandra Jones is on Lexapro, and her stress is still high with work but her mood is stable. She notes fatigue which seems to be worsened after Fall daytime savings change.  5. At risk for impaired metabolic function Alexandra Jones is at increased risk for impaired metabolic function if protein decreases.  Assessment/Plan:   1. Mixed hyperlipidemia Cardiovascular risk and specific lipid/LDL goals reviewed. We discussed several lifestyle modifications today. We will check labs today. [place] will continue to work on diet, exercise and weight loss efforts. Orders and follow up as documented in patient record.   - Lipid Panel With LDL/HDL Ratio  2. Pre-diabetes Alexandra Jones will continue to work on weight loss, exercise, and decreasing simple  carbohydrates to help decrease the risk of diabetes. We will check labs today, and we will refill Ozempic and metformin for 1 month.  - metFORMIN (GLUCOPHAGE) 500 MG tablet; Take 1 tablet by mouth daily with breakfast.  Dispense: 30 tablet; Refill: 0 - Semaglutide, 2 MG/DOSE, 8 MG/3ML SOPN; Inject 2 mg once a week as directed.  Dispense: 6 mL; Refill: 0 - Insulin, random - Hemoglobin A1c - Vitamin B12 - CMP14+EGFR  3. Vitamin D deficiency Low Vitamin D level contributes to fatigue and are associated with obesity, breast, and colon cancer. We will check labs today, and we will refill prescription Vitamin D for 1 month. Alexandra Jones will follow-up for routine testing of Vitamin D, at least 2-3 times per year to avoid over-replacement.  - Vitamin D, Ergocalciferol, (DRISDOL) 1.25 MG (50000 UNIT) CAPS capsule; Take 1 capsule by mouth every 7 days.  Dispense: 4 capsule; Refill: 0 - VITAMIN D 25 Hydroxy (Vit-D Deficiency, Fractures)  4. Other depression with emotional eating Behavior modification techniques were discussed today to help Alexandra Jones deal with her emotional/non-hunger eating behaviors. We will refill Lexapro for 1 month. We discussed increase in light especially in the morning to help if she is starting to develop SAD. Orders and follow up as documented in patient record.   - escitalopram (LEXAPRO) 10 MG tablet; Take 1 tablet by mouth every evening.  Dispense: 30 tablet; Refill: 0  5. At risk for impaired metabolic function Alexandra Jones was given approximately 15 minutes of impaired  metabolic function prevention counseling today. We discussed intensive lifestyle modifications today with  an emphasis on specific nutrition and exercise instructions and strategies.   Repetitive spaced learning was employed today to elicit superior memory formation and behavioral change.  6. Obesity with currently BMI of 42.5 Alexandra Jones is currently in the action stage of change. As such, her goal is to continue with weight  loss efforts. She has agreed to keeping a food journal and adhering to recommended goals of 1500 calories and 85+ grams of protein daily.   Behavioral modification strategies: meal planning and cooking strategies and holiday eating strategies .  Alexandra Jones has agreed to follow-up with our clinic in 2 to 3 weeks. She was informed of the importance of frequent follow-up visits to maximize her success with intensive lifestyle modifications for her multiple health conditions.   Alexandra Jones was informed we would discuss her lab results at her next visit unless there is a critical issue that needs to be addressed sooner. Alexandra Jones agreed to keep her next visit at the agreed upon time to discuss these results.  Objective:   Blood pressure 123/73, pulse 68, temperature 98.1 F (36.7 C), temperature source Oral, height _0  (1.651 m), weight 255 lb (115.7 kg), SpO2 99 %. Body mass index is 42.43 kg/m.  General: Cooperative, alert, well developed, in no acute distress. HEENT: Conjunctivae and lids unremarkable. Cardiovascular: Regular rhythm.  Lungs: Normal work of breathing. Neurologic: No focal deficits.   Lab Results  Component Value Date   CREATININE 0.77 02/26/2021   BUN 13 02/26/2021   NA 140 02/26/2021   K 4.5 02/26/2021   CL 102 02/26/2021   CO2 22 02/26/2021   Lab Results  Component Value Date   ALT 20 02/26/2021   AST 15 02/26/2021   ALKPHOS 59 02/26/2021   BILITOT 0.2 02/26/2021   Lab Results  Component Value Date   HGBA1C 5.5 02/26/2021   HGBA1C 5.8 (H) 10/01/2020   Lab Results  Component Value Date   INSULIN 25.5 (H) 02/26/2021   INSULIN 14.7 10/01/2020   Lab Results  Component Value Date   TSH 1.890 10/01/2020   Lab Results  Component Value Date   CHOL 189 02/26/2021   HDL 41 02/26/2021   LDLCALC 113 (H) 02/26/2021   TRIG 198 (H) 02/26/2021   CHOLHDL 5.1 (H) 03/18/2019   Lab Results  Component Value Date   VD25OH 38.8 02/26/2021   VD25OH 25.0 (L) 10/01/2020   Lab  Results  Component Value Date   WBC 5.0 01/28/2016   HGB 13.6 01/28/2016   HCT 39.8 01/28/2016   MCV 87.7 01/28/2016   PLT 132 (L) 01/28/2016   No results found for: IRON, TIBC, FERRITIN  Attestation Statements:   Reviewed by clinician on day of visit: allergies, medications, problem list, medical history, surgical history, family history, social history, and previous encounter notes.   I, Trixie Dredge, am acting as transcriptionist for Dennard Nip, MD.  I have reviewed the above documentation for accuracy and completeness, and I agree with the above. -  Dennard Nip, MD

## 2021-06-25 LAB — VITAMIN D 25 HYDROXY (VIT D DEFICIENCY, FRACTURES): Vit D, 25-Hydroxy: 43.3 ng/mL (ref 30.0–100.0)

## 2021-06-25 LAB — CMP14+EGFR
ALT: 28 IU/L (ref 0–32)
AST: 20 IU/L (ref 0–40)
Albumin/Globulin Ratio: 1.6 (ref 1.2–2.2)
Albumin: 4.5 g/dL (ref 3.9–5.0)
Alkaline Phosphatase: 72 IU/L (ref 44–121)
BUN/Creatinine Ratio: 13 (ref 9–23)
BUN: 9 mg/dL (ref 6–20)
Bilirubin Total: 0.3 mg/dL (ref 0.0–1.2)
CO2: 24 mmol/L (ref 20–29)
Calcium: 9.1 mg/dL (ref 8.7–10.2)
Chloride: 102 mmol/L (ref 96–106)
Creatinine, Ser: 0.71 mg/dL (ref 0.57–1.00)
Globulin, Total: 2.9 g/dL (ref 1.5–4.5)
Glucose: 77 mg/dL (ref 70–99)
Potassium: 4.3 mmol/L (ref 3.5–5.2)
Sodium: 141 mmol/L (ref 134–144)
Total Protein: 7.4 g/dL (ref 6.0–8.5)
eGFR: 119 mL/min/{1.73_m2} (ref >59)

## 2021-06-25 LAB — LIPID PANEL WITH LDL/HDL RATIO
Cholesterol, Total: 167 mg/dL (ref 100–199)
HDL: 40 mg/dL (ref >39)
LDL Chol Calc (NIH): 101 mg/dL — ABNORMAL HIGH (ref 0–99)
LDL/HDL Ratio: 2.5 ratio (ref 0.0–3.2)
Triglycerides: 145 mg/dL (ref 0–149)
VLDL Cholesterol Cal: 26 mg/dL (ref 5–40)

## 2021-06-25 LAB — INSULIN, RANDOM: INSULIN: 14.5 u[IU]/mL (ref 2.6–24.9)

## 2021-06-25 LAB — HEMOGLOBIN A1C
Est. average glucose Bld gHb Est-mCnc: 114 mg/dL
Hgb A1c MFr Bld: 5.6 % (ref 4.8–5.6)

## 2021-06-25 LAB — VITAMIN B12: Vitamin B-12: 628 pg/mL (ref 232–1245)

## 2021-07-15 ENCOUNTER — Other Ambulatory Visit (HOSPITAL_COMMUNITY): Payer: Self-pay

## 2021-07-15 ENCOUNTER — Ambulatory Visit (INDEPENDENT_AMBULATORY_CARE_PROVIDER_SITE_OTHER): Payer: 59 | Admitting: Family Medicine

## 2021-07-15 ENCOUNTER — Encounter (INDEPENDENT_AMBULATORY_CARE_PROVIDER_SITE_OTHER): Payer: Self-pay | Admitting: Family Medicine

## 2021-07-15 ENCOUNTER — Other Ambulatory Visit: Payer: Self-pay

## 2021-07-15 VITALS — BP 115/72 | HR 90 | Temp 98.0°F | Ht 65.0 in | Wt 254.0 lb

## 2021-07-15 DIAGNOSIS — Z6841 Body Mass Index (BMI) 40.0 and over, adult: Secondary | ICD-10-CM | POA: Diagnosis not present

## 2021-07-15 DIAGNOSIS — R7303 Prediabetes: Secondary | ICD-10-CM | POA: Diagnosis not present

## 2021-07-15 DIAGNOSIS — F3289 Other specified depressive episodes: Secondary | ICD-10-CM | POA: Diagnosis not present

## 2021-07-15 DIAGNOSIS — Z9189 Other specified personal risk factors, not elsewhere classified: Secondary | ICD-10-CM | POA: Diagnosis not present

## 2021-07-15 DIAGNOSIS — E559 Vitamin D deficiency, unspecified: Secondary | ICD-10-CM

## 2021-07-15 MED ORDER — SEMAGLUTIDE (2 MG/DOSE) 8 MG/3ML ~~LOC~~ SOPN
2.0000 mg | PEN_INJECTOR | SUBCUTANEOUS | 0 refills | Status: DC
  Filled 2021-07-15: qty 3, 28d supply, fill #0
  Filled 2021-09-04: qty 3, 28d supply, fill #1

## 2021-07-15 MED ORDER — METFORMIN HCL 500 MG PO TABS
500.0000 mg | ORAL_TABLET | Freq: Every day | ORAL | 0 refills | Status: DC
  Filled 2021-07-15: qty 30, 30d supply, fill #0

## 2021-07-15 MED ORDER — ESCITALOPRAM OXALATE 10 MG PO TABS
10.0000 mg | ORAL_TABLET | Freq: Every evening | ORAL | 0 refills | Status: DC
  Filled 2021-07-15 – 2021-07-19 (×2): qty 30, 30d supply, fill #0

## 2021-07-15 MED ORDER — VITAMIN D (ERGOCALCIFEROL) 1.25 MG (50000 UNIT) PO CAPS
50000.0000 [IU] | ORAL_CAPSULE | ORAL | 0 refills | Status: DC
  Filled 2021-07-15: qty 4, 28d supply, fill #0

## 2021-07-15 NOTE — Progress Notes (Signed)
Chief Complaint:   OBESITY Alexandra Jones is here to discuss her progress with her obesity treatment plan along with follow-up of her obesity related diagnoses. [name] is on keeping a food journal and adhering to recommended goals of 1500 calories and 85+ grams of protein daily and states she is following her eating plan approximately 50% of the time. [name] states she is doing 0 minutes 0 times per week.  Today's visit was #: 15 Starting weight: 250 lbs Starting date: 10/01/2020 Today's weight: 254 lbs Today's date: 07/15/2021 Total lbs lost to date: 0 Total lbs lost since last in-office visit: 1  Interim History: [name] has done well avoiding holiday weight gain. She is extra busy at work and meal planning is challenging. She feels journaling all day is giving her too much freedom.  Subjective:   1. Pre-diabetes Alexandra Jones's A1c is in range and her fasting insulin has improved. She denies nausea, vomiting, or hypoglycemia. I discussed labs with the patient today.  2. Vitamin D deficiency Alexandra Jones's Vit D level continues to improve, but is not yet at goal. I discussed labs with the patient today.  3. Other depression with emotional eating Alexandra Jones is stable on Lexapro and her blood pressure is stable. She denies insomnia.  4. At risk for heart disease Alexandra Jones is at a higher than average risk for cardiovascular disease due to obesity.   Assessment/Plan:   1. Pre-diabetes Alexandra Jones will continue to work on weight loss, exercise, and decreasing simple carbohydrates to help decrease the risk of diabetes. We will refill metformin and Ozempic for 1 month.  - metFORMIN (GLUCOPHAGE) 500 MG tablet; Take 1 tablet by mouth daily with breakfast.  Dispense: 30 tablet; Refill: 0 - Semaglutide, 2 MG/DOSE, 8 MG/3ML SOPN; Inject 2 mg once a week as directed.  Dispense: 6 mL; Refill: 0  2. Vitamin D deficiency Low Vitamin D level contributes to fatigue and are associated with obesity, breast, and colon cancer. We will  refill prescription Vitamin D for 1 month. Alexandra Jones will follow-up for routine testing of Vitamin D, at least 2-3 times per year to avoid over-replacement.  - Vitamin D, Ergocalciferol, (DRISDOL) 1.25 MG (50000 UNIT) CAPS capsule; Take 1 capsule by mouth every 7 days.  Dispense: 4 capsule; Refill: 0  3. Other depression with emotional eating Behavior modification techniques were discussed today to help Alexandra Jones deal with her emotional/non-hunger eating behaviors. We will refill Lexapro for 1 month. Orders and follow up as documented in patient record.   - escitalopram (LEXAPRO) 10 MG tablet; Take 1 tablet by mouth every evening.  Dispense: 30 tablet; Refill: 0  4. At risk for heart disease Alexandra Jones was given approximately 15 minutes of coronary artery disease prevention counseling today. She is 25 y.o. female and has risk factors for heart disease including obesity. We discussed intensive lifestyle modifications today with an emphasis on specific weight loss instructions and strategies.   Repetitive spaced learning was employed today to elicit superior memory formation and behavioral change.  5. Obesity with current BMI of 42.4 Alexandra Jones is currently in the action stage of change. As such, her goal is to continue with weight loss efforts. She has agreed to change to the Category 3 Plan and keeping a food journal and adhering to recommended goals of 300-400 calories and 25+ grams of protein at breakfast daily.   Behavioral modification strategies: increasing lean protein intake and meal planning and cooking strategies.  Alexandra Jones has agreed to follow-up with our clinic in 2  weeks. She was informed of the importance of frequent follow-up visits to maximize her success with intensive lifestyle modifications for her multiple health conditions.   Objective:   Blood pressure 115/72, pulse 90, temperature 98 F (36.7 C), temperature source Oral, height 5\' 5"  (1.651 m), weight 254 lb (115.2 kg), SpO2 96 %. Body mass  index is 42.27 kg/m.  General: Cooperative, alert, well developed, in no acute distress. HEENT: Conjunctivae and lids unremarkable. Cardiovascular: Regular rhythm.  Lungs: Normal work of breathing. Neurologic: No focal deficits.   Lab Results  Component Value Date   CREATININE 0.71 06/24/2021   BUN 9 06/24/2021   NA 141 06/24/2021   K 4.3 06/24/2021   CL 102 06/24/2021   CO2 24 06/24/2021   Lab Results  Component Value Date   ALT 28 06/24/2021   AST 20 06/24/2021   ALKPHOS 72 06/24/2021   BILITOT 0.3 06/24/2021   Lab Results  Component Value Date   HGBA1C 5.6 06/24/2021   HGBA1C 5.5 02/26/2021   HGBA1C 5.8 (H) 10/01/2020   Lab Results  Component Value Date   INSULIN 14.5 06/24/2021   INSULIN 25.5 (H) 02/26/2021   INSULIN 14.7 10/01/2020   Lab Results  Component Value Date   TSH 1.890 10/01/2020   Lab Results  Component Value Date   CHOL 167 06/24/2021   HDL 40 06/24/2021   LDLCALC 101 (H) 06/24/2021   TRIG 145 06/24/2021   CHOLHDL 5.1 (H) 03/18/2019   Lab Results  Component Value Date   VD25OH 43.3 06/24/2021   VD25OH 38.8 02/26/2021   VD25OH 25.0 (L) 10/01/2020   Lab Results  Component Value Date   WBC 5.0 01/28/2016   HGB 13.6 01/28/2016   HCT 39.8 01/28/2016   MCV 87.7 01/28/2016   PLT 132 (L) 01/28/2016   No results found for: IRON, TIBC, FERRITIN  Attestation Statements:   Reviewed by clinician on day of visit: allergies, medications, problem list, medical history, surgical history, family history, social history, and previous encounter notes.   I, Trixie Dredge, am acting as transcriptionist for Dennard Nip, MD.  I have reviewed the above documentation for accuracy and completeness, and I agree with the above. -  Dennard Nip, MD

## 2021-07-18 ENCOUNTER — Other Ambulatory Visit (HOSPITAL_COMMUNITY): Payer: Self-pay

## 2021-07-19 ENCOUNTER — Other Ambulatory Visit (HOSPITAL_COMMUNITY): Payer: Self-pay

## 2021-07-29 ENCOUNTER — Telehealth (INDEPENDENT_AMBULATORY_CARE_PROVIDER_SITE_OTHER): Payer: 59 | Admitting: Family Medicine

## 2021-07-29 ENCOUNTER — Other Ambulatory Visit (HOSPITAL_COMMUNITY): Payer: Self-pay

## 2021-07-29 ENCOUNTER — Encounter (INDEPENDENT_AMBULATORY_CARE_PROVIDER_SITE_OTHER): Payer: Self-pay | Admitting: Family Medicine

## 2021-07-29 VITALS — Temp 100.2°F

## 2021-07-29 DIAGNOSIS — R7303 Prediabetes: Secondary | ICD-10-CM

## 2021-07-29 DIAGNOSIS — E559 Vitamin D deficiency, unspecified: Secondary | ICD-10-CM | POA: Diagnosis not present

## 2021-07-29 DIAGNOSIS — F3289 Other specified depressive episodes: Secondary | ICD-10-CM

## 2021-07-29 DIAGNOSIS — Z6841 Body Mass Index (BMI) 40.0 and over, adult: Secondary | ICD-10-CM | POA: Diagnosis not present

## 2021-07-29 MED ORDER — ESCITALOPRAM OXALATE 10 MG PO TABS
10.0000 mg | ORAL_TABLET | Freq: Every evening | ORAL | 0 refills | Status: DC
  Filled 2021-07-29: qty 30, 30d supply, fill #0

## 2021-07-29 MED ORDER — METFORMIN HCL 500 MG PO TABS
500.0000 mg | ORAL_TABLET | Freq: Every day | ORAL | 0 refills | Status: DC
  Filled 2021-07-29: qty 30, 30d supply, fill #0

## 2021-07-29 MED ORDER — VITAMIN D (ERGOCALCIFEROL) 1.25 MG (50000 UNIT) PO CAPS
50000.0000 [IU] | ORAL_CAPSULE | ORAL | 0 refills | Status: DC
  Filled 2021-07-29: qty 4, 28d supply, fill #0

## 2021-07-30 NOTE — Progress Notes (Signed)
TeleHealth Visit:  Due to the COVID-19 pandemic, this visit was completed with telemedicine (audio/video) technology to reduce patient and provider exposure as well as to preserve personal protective equipment.   Alexandra Jones has verbally consented to this TeleHealth visit. The patient is located at home, the provider is located at the [facility] office. The participants in this visit include the listed provider and patient. The visit was conducted today via MyChart video.   Chief Complaint: OBESITY Alexandra Jones is here to discuss her progress with her obesity treatment plan along with follow-up of her obesity related diagnoses. Alexandra Jones is on the Category 3 Plan and keeping a food journal and adhering to recommended goals of 300-400 calories and 25+ grams of protein at breakfast daily and states she is following her eating plan approximately 50% of the time. Alexandra Jones states she is doing 0 minutes 0 times per week.  Today's visit was #: 16 Starting weight: 250 lbs Starting date: 10/01/2020  Interim History: Alexandra Jones is sick with an upper respiratory infection and her visit was changed to virtual. She hasn't been staying with her plan as much over the holidays, and she is frustrated with herself and her lack of progress.  Subjective:   1. Vitamin D deficiency Alexandra Jones is stable on Vit D with side effects noted. She requests a refill today.  2. Pre-diabetes Alexandra Jones is working on her diet and exercise. She is stable on metformin and Ozempic.  3. Other depression with emotional eating Alexandra Jones is stable on her medications. She has had increased temptations over the holidays.  Assessment/Plan:   1. Vitamin D deficiency We will refill prescription Vitamin D for 1 month. Alexandra Jones will follow-up for routine testing of Vitamin D, at least 2-3 times per year to avoid over-replacement.  - Vitamin D, Ergocalciferol, (DRISDOL) 1.25 MG (50000 UNIT) CAPS capsule; Take 1 capsule by mouth every 7 days.  Dispense:  4 capsule; Refill: 0  2. Pre-diabetes We will refill metformin for 1 month. Alexandra Jones is to make sure to not skip meals or her GLP-1. She will continue to work on weight loss, exercise, and decreasing simple carbohydrates to help decrease the risk of diabetes.   - metFORMIN (GLUCOPHAGE) 500 MG tablet; Take 1 tablet by mouth daily with breakfast.  Dispense: 30 tablet; Refill: 0  3. Other depression with emotional eating We will refill Lexapro for 1 month. Behavior modification techniques were discussed today to help Alexandra Jones deal with her emotional/non-hunger eating behaviors. Orders and follow up as documented in patient record.   - escitalopram (LEXAPRO) 10 MG tablet; Take 1 tablet by mouth every evening.  Dispense: 30 tablet; Refill: 0  4. Obesity with current BMI of 42.4 Alexandra Jones is currently in the action stage of change. As such, her goal is to continue with weight loss efforts. She has agreed to keeping a food journal and adhering to recommended goals of 1500 calories and 85+ grams of protein daily.   Alexandra Jones was reminded to adjust her weight loss goals to avoid weight gain over the holidays, and she  was reassured that she would do better when the excessive temptations improve.  Behavioral modification strategies: holiday eating strategies .  Alexandra Jones has agreed to follow-up with our clinic in 3 weeks. She was informed of the importance of frequent follow-up visits to maximize her success with intensive lifestyle modifications for her multiple health conditions.  Objective:   VITALS: Per patient if applicable, see vitals. GENERAL: Alert and in no acute distress.  CARDIOPULMONARY: No increased WOB. Speaking in clear sentences.  PSYCH: Pleasant and cooperative. Speech normal rate and rhythm. Affect is appropriate. Insight and judgement are appropriate. Attention is focused, linear, and appropriate.  NEURO: Oriented as arrived to appointment on time with no prompting.   Lab Results  Component Value  Date   CREATININE 0.71 06/24/2021   BUN 9 06/24/2021   NA 141 06/24/2021   K 4.3 06/24/2021   CL 102 06/24/2021   CO2 24 06/24/2021   Lab Results  Component Value Date   ALT 28 06/24/2021   AST 20 06/24/2021   ALKPHOS 72 06/24/2021   BILITOT 0.3 06/24/2021   Lab Results  Component Value Date   HGBA1C 5.6 06/24/2021   HGBA1C 5.5 02/26/2021   HGBA1C 5.8 (H) 10/01/2020   Lab Results  Component Value Date   INSULIN 14.5 06/24/2021   INSULIN 25.5 (H) 02/26/2021   INSULIN 14.7 10/01/2020   Lab Results  Component Value Date   TSH 1.890 10/01/2020   Lab Results  Component Value Date   CHOL 167 06/24/2021   HDL 40 06/24/2021   LDLCALC 101 (H) 06/24/2021   TRIG 145 06/24/2021   CHOLHDL 5.1 (H) 03/18/2019   Lab Results  Component Value Date   VD25OH 43.3 06/24/2021   VD25OH 38.8 02/26/2021   VD25OH 25.0 (L) 10/01/2020   Lab Results  Component Value Date   WBC 5.0 01/28/2016   HGB 13.6 01/28/2016   HCT 39.8 01/28/2016   MCV 87.7 01/28/2016   PLT 132 (L) 01/28/2016   No results found for: IRON, TIBC, FERRITIN  Attestation Statements:   Reviewed by clinician on day of visit: allergies, medications, problem list, medical history, surgical history, family history, social history, and previous encounter notes.   I, Trixie Dredge, am acting as transcriptionist for Dennard Nip, MD.  I have reviewed the above documentation for accuracy and completeness, and I agree with the above. - Dennard Nip, MD

## 2021-08-12 ENCOUNTER — Encounter: Payer: Self-pay | Admitting: Family Medicine

## 2021-08-20 ENCOUNTER — Encounter (INDEPENDENT_AMBULATORY_CARE_PROVIDER_SITE_OTHER): Payer: Self-pay | Admitting: Family Medicine

## 2021-08-20 ENCOUNTER — Ambulatory Visit (INDEPENDENT_AMBULATORY_CARE_PROVIDER_SITE_OTHER): Payer: 59 | Admitting: Family Medicine

## 2021-08-20 ENCOUNTER — Other Ambulatory Visit (HOSPITAL_COMMUNITY): Payer: Self-pay

## 2021-08-20 ENCOUNTER — Other Ambulatory Visit: Payer: Self-pay

## 2021-08-20 VITALS — BP 120/78 | HR 80 | Temp 98.3°F | Ht 65.0 in | Wt 256.0 lb

## 2021-08-20 DIAGNOSIS — E669 Obesity, unspecified: Secondary | ICD-10-CM | POA: Diagnosis not present

## 2021-08-20 DIAGNOSIS — R7303 Prediabetes: Secondary | ICD-10-CM

## 2021-08-20 DIAGNOSIS — Z9189 Other specified personal risk factors, not elsewhere classified: Secondary | ICD-10-CM

## 2021-08-20 DIAGNOSIS — Z6841 Body Mass Index (BMI) 40.0 and over, adult: Secondary | ICD-10-CM

## 2021-08-20 DIAGNOSIS — F3289 Other specified depressive episodes: Secondary | ICD-10-CM | POA: Diagnosis not present

## 2021-08-20 DIAGNOSIS — E559 Vitamin D deficiency, unspecified: Secondary | ICD-10-CM

## 2021-08-20 MED ORDER — VITAMIN D (ERGOCALCIFEROL) 1.25 MG (50000 UNIT) PO CAPS
50000.0000 [IU] | ORAL_CAPSULE | ORAL | 0 refills | Status: DC
Start: 2021-08-20 — End: 2021-09-09
  Filled 2021-08-20: qty 4, 28d supply, fill #0

## 2021-08-20 MED ORDER — ESCITALOPRAM OXALATE 20 MG PO TABS
20.0000 mg | ORAL_TABLET | Freq: Every day | ORAL | 0 refills | Status: DC
  Filled 2021-08-20: qty 30, 30d supply, fill #0

## 2021-08-20 MED ORDER — OZEMPIC (0.25 OR 0.5 MG/DOSE) 2 MG/1.5ML ~~LOC~~ SOPN
2.0000 mg | PEN_INJECTOR | SUBCUTANEOUS | 1 refills | Status: DC
  Filled 2021-08-20 – 2021-09-03 (×3): qty 1.5, 7d supply, fill #0

## 2021-08-20 MED ORDER — METFORMIN HCL 500 MG PO TABS
500.0000 mg | ORAL_TABLET | Freq: Every day | ORAL | 1 refills | Status: DC
  Filled 2021-08-20: qty 30, 30d supply, fill #0

## 2021-08-21 NOTE — Progress Notes (Signed)
Chief Complaint:   OBESITY Alexandra Jones is here to discuss her progress with her obesity treatment plan along with follow-up of her obesity related diagnoses. Alexandra Jones is on keeping a food journal and adhering to recommended goals of 1500 calories and 85+ grams of protein daily and states she is following her eating plan approximately 60% of the time. Alexandra Jones states she is doing 0 minutes 0 times per week.  Today's visit was #: 41 Starting weight: 250 lbs Starting date: 10/01/2020 Today's weight: 256 lbs Today's date: 08/20/2021 Total lbs lost to date: 0 Total lbs lost since last in-office visit: 0  Interim History: [name] did some celebration eating over the holidays, but she did well with minimizing holiday weight gain. She is ready to get back on track, and she is open to other eating plans.  Subjective:   1. Pre-diabetes Alexandra Jones did some celebration eating over the holidays, but she is ready to get back on track. She is stable on her medications.  2. Vitamin D deficiency Alexandra Jones is stable on Vit D, and she denies nausea, vomiting, or muscle weakness.  3. Other depression with emotional eating Alexandra Jones notes increased fatigue, worse in the last 2-4 weeks. She has increased stress at work and she is sleeping well.  4. At risk for impaired metabolic function Alexandra Jones is at increased risk for impaired metabolic function if protein decreases.  Assessment/Plan:   1. Pre-diabetes We will refill Ozempic and metformin for 2 months. Chesnie will continue with diet, exercise, and decreasing simple carbohydrates to help decrease the risk of diabetes.   - metFORMIN (GLUCOPHAGE) 500 MG tablet; Take 1 tablet by mouth daily with breakfast.  Dispense: 30 tablet; Refill: 1 - Semaglutide,0.25 or 0.5MG /DOS, (OZEMPIC, 0.25 OR 0.5 MG/DOSE,) 2 MG/1.5ML SOPN; Inject 2 mg into the skin once a week.  Dispense: 1.5 mL; Refill: 1  2. Vitamin D deficiency Low Vitamin D level contributes to fatigue and are associated with  obesity, breast, and colon cancer. We will refill prescription Vitamin D for 1 month. Tynia will follow-up for routine testing of Vitamin D, at least 2-3 times per year to avoid over-replacement.  - Vitamin D, Ergocalciferol, (DRISDOL) 1.25 MG (50000 UNIT) CAPS capsule; Take 1 capsule by mouth every 7 days.  Dispense: 4 capsule; Refill: 0  3. Other depression with emotional eating Alexandra Jones agreed to increase Lexapro to 20 mg daily and we will follow up in 2-3 weeks. Behavior modification techniques were discussed today to help Alexandra Jones deal with her emotional/non-hunger eating behaviors. Orders and follow up as documented in patient record.   - escitalopram (LEXAPRO) 20 MG tablet; Take 1 tablet (20 mg total) by mouth daily.  Dispense: 30 tablet; Refill: 0  4. At risk for impaired metabolic function [name] was given approximately 15 minutes of impaired  metabolic function prevention counseling today. We discussed intensive lifestyle modifications today with an emphasis on specific nutrition and exercise instructions and strategies.   Repetitive spaced learning was employed today to elicit superior memory formation and behavioral change.  5. Obesity with current BMI of 42.7 Alexandra Jones is currently in the action stage of change. As such, her goal is to continue with weight loss efforts. She has agreed to following a lower carbohydrate, vegetable and lean protein rich diet plan.   Behavioral modification strategies: decreasing simple carbohydrates and increasing water intake.  Alexandra Jones has agreed to follow-up with our clinic in 2 to 3 weeks. She was informed of the importance of frequent follow-up visits  to maximize her success with intensive lifestyle modifications for her multiple health conditions.   Objective:   Blood pressure 120/78, pulse 80, temperature 98.3 F (36.8 C), temperature source Oral, height 5\' 5"  (1.651 m), weight 256 lb (116.1 kg), SpO2 99 %. Body mass index is 42.6 kg/m.  General:  Cooperative, alert, well developed, in no acute distress. HEENT: Conjunctivae and lids unremarkable. Cardiovascular: Regular rhythm.  Lungs: Normal work of breathing. Neurologic: No focal deficits.   Lab Results  Component Value Date   CREATININE 0.71 06/24/2021   BUN 9 06/24/2021   NA 141 06/24/2021   K 4.3 06/24/2021   CL 102 06/24/2021   CO2 24 06/24/2021   Lab Results  Component Value Date   ALT 28 06/24/2021   AST 20 06/24/2021   ALKPHOS 72 06/24/2021   BILITOT 0.3 06/24/2021   Lab Results  Component Value Date   HGBA1C 5.6 06/24/2021   HGBA1C 5.5 02/26/2021   HGBA1C 5.8 (H) 10/01/2020   Lab Results  Component Value Date   INSULIN 14.5 06/24/2021   INSULIN 25.5 (H) 02/26/2021   INSULIN 14.7 10/01/2020   Lab Results  Component Value Date   TSH 1.890 10/01/2020   Lab Results  Component Value Date   CHOL 167 06/24/2021   HDL 40 06/24/2021   LDLCALC 101 (H) 06/24/2021   TRIG 145 06/24/2021   CHOLHDL 5.1 (H) 03/18/2019   Lab Results  Component Value Date   VD25OH 43.3 06/24/2021   VD25OH 38.8 02/26/2021   VD25OH 25.0 (L) 10/01/2020   Lab Results  Component Value Date   WBC 5.0 01/28/2016   HGB 13.6 01/28/2016   HCT 39.8 01/28/2016   MCV 87.7 01/28/2016   PLT 132 (L) 01/28/2016   No results found for: IRON, TIBC, FERRITIN  Attestation Statements:   Reviewed by clinician on day of visit: allergies, medications, problem list, medical history, surgical history, family history, social history, and previous encounter notes.   I, Trixie Dredge, am acting as transcriptionist for Dennard Nip, MD.  I have reviewed the above documentation for accuracy and completeness, and I agree with the above. -  Dennard Nip, MD

## 2021-08-31 ENCOUNTER — Other Ambulatory Visit (HOSPITAL_COMMUNITY): Payer: Self-pay

## 2021-09-04 ENCOUNTER — Other Ambulatory Visit (HOSPITAL_COMMUNITY): Payer: Self-pay

## 2021-09-09 ENCOUNTER — Ambulatory Visit (INDEPENDENT_AMBULATORY_CARE_PROVIDER_SITE_OTHER): Payer: 59 | Admitting: Bariatrics

## 2021-09-09 ENCOUNTER — Encounter (INDEPENDENT_AMBULATORY_CARE_PROVIDER_SITE_OTHER): Payer: Self-pay | Admitting: Bariatrics

## 2021-09-09 ENCOUNTER — Other Ambulatory Visit: Payer: Self-pay

## 2021-09-09 ENCOUNTER — Other Ambulatory Visit (HOSPITAL_COMMUNITY): Payer: Self-pay

## 2021-09-09 VITALS — BP 121/76 | HR 86 | Temp 98.0°F | Ht 65.0 in | Wt 254.0 lb

## 2021-09-09 DIAGNOSIS — R7303 Prediabetes: Secondary | ICD-10-CM | POA: Diagnosis not present

## 2021-09-09 DIAGNOSIS — E669 Obesity, unspecified: Secondary | ICD-10-CM

## 2021-09-09 DIAGNOSIS — Z6841 Body Mass Index (BMI) 40.0 and over, adult: Secondary | ICD-10-CM

## 2021-09-09 DIAGNOSIS — E559 Vitamin D deficiency, unspecified: Secondary | ICD-10-CM | POA: Diagnosis not present

## 2021-09-09 DIAGNOSIS — F3289 Other specified depressive episodes: Secondary | ICD-10-CM | POA: Diagnosis not present

## 2021-09-09 MED ORDER — ESCITALOPRAM OXALATE 20 MG PO TABS
20.0000 mg | ORAL_TABLET | Freq: Every day | ORAL | 0 refills | Status: DC
  Filled 2021-09-09: qty 30, 30d supply, fill #0

## 2021-09-09 MED ORDER — SEMAGLUTIDE (2 MG/DOSE) 8 MG/3ML ~~LOC~~ SOPN
2.0000 mg | PEN_INJECTOR | SUBCUTANEOUS | 0 refills | Status: DC
  Filled 2021-09-09: qty 6, 56d supply, fill #0

## 2021-09-09 MED ORDER — METFORMIN HCL 500 MG PO TABS
500.0000 mg | ORAL_TABLET | Freq: Every day | ORAL | 1 refills | Status: DC
  Filled 2021-09-09: qty 30, 30d supply, fill #0

## 2021-09-09 MED ORDER — VITAMIN D (ERGOCALCIFEROL) 1.25 MG (50000 UNIT) PO CAPS
50000.0000 [IU] | ORAL_CAPSULE | ORAL | 0 refills | Status: DC
  Filled 2021-09-09: qty 4, 28d supply, fill #0

## 2021-09-09 NOTE — Progress Notes (Signed)
Chief Complaint:   OBESITY Alexandra Jones is here to discuss her progress with her obesity treatment plan along with follow-up of her obesity related diagnoses. Alexandra Jones is on following a lower carbohydrate, vegetable and lean protein rich diet plan and states she is following her eating plan approximately 50% of the time. Alexandra Jones states she is doing 0 minutes 0 times per week.  Today's visit was #: 18 Starting weight: 250 lbs Starting date: 10/01/2020 Today's weight: 254 lbs Today's date: 09/09/2021 Total lbs lost to date: 0 Total lbs lost since last in-office visit: 2 lbs  Interim History: Makaiyah is down 2 lbs since her last visit. She had switched from a category plan to low carbohydrates prior to this. She was on 1500 calories and 85 plus grams of protein.   Subjective:   1. Pre-diabetes Alexandra Jones is taking Metformin currently.  2. Vitamin D deficiency Alexandra Jones is taking her medications as directed.   3. Other depression with emotional eating Alexandra Jones is taking her medications as directed.   Assessment/Plan:   1. Pre-diabetes We will refill Ozempic 2 mg, 1 month supply with no refills Metformin 500 mg for 1 month with no refills. Alexandra Jones will continue to work on weight loss, exercise, and decreasing simple carbohydrates to help decrease the risk of diabetes.   - Semaglutide, 2 MG/DOSE, 8 MG/3ML SOPN; Inject 2 mg into the skin once a week as directed.  Dispense: 6 mL; Refill: 0 - metFORMIN (GLUCOPHAGE) 500 MG tablet; Take 1 tablet by mouth daily with breakfast.  Dispense: 30 tablet; Refill: 1  2. Vitamin D deficiency Low Vitamin D level contributes to fatigue and are associated with obesity, breast, and colon cancer. We will refill prescription Vitamin D 50,000 IU every week for 1 month with no refills and Alexandra Jones will follow-up for routine testing of Vitamin D, at least 2-3 times per year to avoid over-replacement.  - Vitamin D, Ergocalciferol, (DRISDOL) 1.25 MG (50000 UNIT) CAPS capsule; Take 1  capsule by mouth every 7 days.  Dispense: 4 capsule; Refill: 0  3. Other depression with emotional eating We will refill Lexapro 20 mg for 1 month with no refills. Behavior modification techniques were discussed today to help Alexandra Jones deal with her emotional/non-hunger eating behaviors.  Orders and follow up as documented in patient record.    - escitalopram (LEXAPRO) 20 MG tablet; Take 1 tablet (20 mg total) by mouth daily.  Dispense: 30 tablet; Refill: 0  4. Obesity with current BMI of 42.3 Alexandra Jones is currently in the action stage of change. As such, her goal is to continue with weight loss efforts. She has agreed to the Category 3 Plan and keeping a food journal and adhering to recommended goals of 1500 calories and 80-90 grams of protein.   Alexandra Jones will continue meal planning and she will continue intentional eating.   Exercise goals: No exercise has been prescribed at this time.  Behavioral modification strategies: increasing lean protein intake, decreasing simple carbohydrates, increasing vegetables, increasing water intake, decreasing eating out, no skipping meals, meal planning and cooking strategies, keeping healthy foods in the home, and planning for success.  Alexandra Jones has agreed to follow-up with our clinic in 3-4 weeks with Dr. Leafy Ro or Mina Marble, NP. She was informed of the importance of frequent follow-up visits to maximize her success with intensive lifestyle modifications for her multiple health conditions.   Objective:   Blood pressure 121/76, pulse 86, temperature 98 F (36.7 C), height 5\' 5"  (1.651 m), weight  254 lb (115.2 kg), SpO2 97 %. Body mass index is 42.27 kg/m.  General: Cooperative, alert, well developed, in no acute distress. HEENT: Conjunctivae and lids unremarkable. Cardiovascular: Regular rhythm.  Lungs: Normal work of breathing. Neurologic: No focal deficits.   Lab Results  Component Value Date   CREATININE 0.71 06/24/2021   BUN 9 06/24/2021   NA 141  06/24/2021   K 4.3 06/24/2021   CL 102 06/24/2021   CO2 24 06/24/2021   Lab Results  Component Value Date   ALT 28 06/24/2021   AST 20 06/24/2021   ALKPHOS 72 06/24/2021   BILITOT 0.3 06/24/2021   Lab Results  Component Value Date   HGBA1C 5.6 06/24/2021   HGBA1C 5.5 02/26/2021   HGBA1C 5.8 (H) 10/01/2020   Lab Results  Component Value Date   INSULIN 14.5 06/24/2021   INSULIN 25.5 (H) 02/26/2021   INSULIN 14.7 10/01/2020   Lab Results  Component Value Date   TSH 1.890 10/01/2020   Lab Results  Component Value Date   CHOL 167 06/24/2021   HDL 40 06/24/2021   LDLCALC 101 (H) 06/24/2021   TRIG 145 06/24/2021   CHOLHDL 5.1 (H) 03/18/2019   Lab Results  Component Value Date   VD25OH 43.3 06/24/2021   VD25OH 38.8 02/26/2021   VD25OH 25.0 (L) 10/01/2020   Lab Results  Component Value Date   WBC 5.0 01/28/2016   HGB 13.6 01/28/2016   HCT 39.8 01/28/2016   MCV 87.7 01/28/2016   PLT 132 (L) 01/28/2016   No results found for: IRON, TIBC, FERRITIN  Attestation Statements:   Reviewed by clinician on day of visit: allergies, medications, problem list, medical history, surgical history, family history, social history, and previous encounter notes.  I, Lizbeth Bark, RMA, am acting as Location manager for [employer], DO.  I have reviewed the above documentation for accuracy and completeness, and I agree with the above. Jearld Lesch, DO

## 2021-09-11 ENCOUNTER — Other Ambulatory Visit (HOSPITAL_COMMUNITY): Payer: Self-pay

## 2021-09-13 ENCOUNTER — Other Ambulatory Visit (HOSPITAL_COMMUNITY): Payer: Self-pay

## 2021-09-23 ENCOUNTER — Other Ambulatory Visit (HOSPITAL_COMMUNITY): Payer: Self-pay

## 2021-09-23 ENCOUNTER — Encounter (INDEPENDENT_AMBULATORY_CARE_PROVIDER_SITE_OTHER): Payer: Self-pay | Admitting: Family Medicine

## 2021-09-23 ENCOUNTER — Other Ambulatory Visit: Payer: Self-pay

## 2021-09-23 ENCOUNTER — Ambulatory Visit (INDEPENDENT_AMBULATORY_CARE_PROVIDER_SITE_OTHER): Payer: 59 | Admitting: Family Medicine

## 2021-09-23 VITALS — BP 117/75 | HR 91 | Temp 98.1°F | Ht 65.0 in | Wt 253.0 lb

## 2021-09-23 DIAGNOSIS — E669 Obesity, unspecified: Secondary | ICD-10-CM

## 2021-09-23 DIAGNOSIS — F3289 Other specified depressive episodes: Secondary | ICD-10-CM | POA: Diagnosis not present

## 2021-09-23 DIAGNOSIS — Z6841 Body Mass Index (BMI) 40.0 and over, adult: Secondary | ICD-10-CM | POA: Diagnosis not present

## 2021-09-23 DIAGNOSIS — R7303 Prediabetes: Secondary | ICD-10-CM | POA: Diagnosis not present

## 2021-09-23 DIAGNOSIS — E559 Vitamin D deficiency, unspecified: Secondary | ICD-10-CM

## 2021-09-23 DIAGNOSIS — Z9189 Other specified personal risk factors, not elsewhere classified: Secondary | ICD-10-CM

## 2021-09-23 MED ORDER — VITAMIN D (ERGOCALCIFEROL) 1.25 MG (50000 UNIT) PO CAPS
50000.0000 [IU] | ORAL_CAPSULE | ORAL | 0 refills | Status: DC
  Filled 2021-09-23: qty 4, 28d supply, fill #0

## 2021-09-23 MED ORDER — METFORMIN HCL 500 MG PO TABS
500.0000 mg | ORAL_TABLET | Freq: Every day | ORAL | 1 refills | Status: DC
  Filled 2021-09-23: qty 30, 30d supply, fill #0

## 2021-09-23 MED ORDER — ESCITALOPRAM OXALATE 10 MG PO TABS
10.0000 mg | ORAL_TABLET | Freq: Every day | ORAL | 0 refills | Status: DC
  Filled 2021-09-23: qty 30, 30d supply, fill #0

## 2021-09-23 MED ORDER — SEMAGLUTIDE (2 MG/DOSE) 8 MG/3ML ~~LOC~~ SOPN
2.0000 mg | PEN_INJECTOR | SUBCUTANEOUS | 0 refills | Status: DC
  Filled 2021-09-23: qty 6, 56d supply, fill #0

## 2021-09-23 NOTE — Progress Notes (Signed)
Chief Complaint:   OBESITY Alexandra Jones is here to discuss her progress with her obesity treatment plan along with follow-up of her obesity related diagnoses. Alexandra Jones is on the Category 3 Plan and keeping a food journal and adhering to recommended goals of 1500 calories and 80-90 grams of protein daily and states she is following her eating plan approximately 75% of the time. Alexandra Jones states she is doing 0 minutes 0 times per week.  Today's visit was #: 22 Starting weight: 250 lbs Starting date: 10/01/2020 Today's weight: 253 lbs Today's date: 09/23/2021 Total lbs lost to date: 0 Total lbs lost since last in-office visit: 1  Interim History: Alexandra Jones continues to do well with weight loss. She has had some extra challenges, but she is working on Careers adviser.  Subjective:   1. Pre-diabetes Alexandra Jones is stable on her medications, with no side effects noted. She is working on diet and weight loss.  2. Vitamin D deficiency Alexandra Jones is stable on Vitamin D, and her level is slowly improving.  3. Other depression with emotional eating Alexandra Jones notes increased side effects on a higher dose of Lexapro. She feels her mood has fattened and her libido has decreased.  4. At risk for diabetes mellitus Alexandra Jones is at higher than average risk for developing diabetes due to her obesity.  Assessment/Plan:   1. Pre-diabetes Alexandra Jones will continue to work on weight loss, exercise, and decreasing simple carbohydrates to help decrease the risk of diabetes. We will refill metformin for 2 months and Ozempic for 1 month.  - metFORMIN (GLUCOPHAGE) 500 MG tablet; Take 1 tablet by mouth daily with breakfast.  Dispense: 30 tablet; Refill: 1 - Semaglutide, 2 MG/DOSE, 8 MG/3ML SOPN; Inject 2 mg into the skin once a week as directed.  Dispense: 6 mL; Refill: 0  2. Vitamin D deficiency We will refill prescription Vitamin D for 1 month. Alexandra Jones will follow-up for routine testing of Vitamin D, at least 2-3 times per year  to avoid over-replacement.  - Vitamin D, Ergocalciferol, (DRISDOL) 1.25 MG (50000 UNIT) CAPS capsule; Take 1 capsule by mouth every 7 days.  Dispense: 4 capsule; Refill: 0  3. Other depression with emotional eating Alexandra Jones agreed to decrease Lexapro to 10 mg daily, and we will refill for 1 month. Behavior modification techniques were discussed today to help Alexandra Jones deal with her emotional/non-hunger eating behaviors. Orders and follow up as documented in patient record.   4. At risk for diabetes mellitus Alexandra Jones was given approximately 15 minutes of diabetic education and counseling today. We discussed intensive lifestyle modifications today with an emphasis on weight loss as well as increasing exercise and decreasing simple carbohydrates in her diet. We also reviewed medication options with an emphasis on risk versus benefits of those discussed.  Repetitive spaced learning was employed today to elicit superior memory formation and behavioral change.  5. Obesity with current BMI of 42.1 Alexandra Jones is currently in the action stage of change. As such, her goal is to continue with weight loss efforts. She has agreed to practicing portion control and making smarter food choices, such as increasing vegetables and decreasing simple carbohydrates.   Behavioral modification strategies: increasing lean protein intake and meal planning and cooking strategies.  Alexandra Jones has agreed to follow-up with our clinic in 2 to 3 weeks. She was informed of the importance of frequent follow-up visits to maximize her success with intensive lifestyle modifications for her multiple health conditions.   Objective:   Blood pressure  117/75, pulse 91, temperature 98.1 F (36.7 C), height 5\' 5"  (1.651 m), weight 253 lb (114.8 kg), SpO2 98 %. Body mass index is 42.1 kg/m.  General: Cooperative, alert, well developed, in no acute distress. HEENT: Conjunctivae and lids unremarkable. Cardiovascular: Regular rhythm.  Lungs: Normal work  of breathing. Neurologic: No focal deficits.   Lab Results  Component Value Date   CREATININE 0.71 06/24/2021   BUN 9 06/24/2021   NA 141 06/24/2021   K 4.3 06/24/2021   CL 102 06/24/2021   CO2 24 06/24/2021   Lab Results  Component Value Date   ALT 28 06/24/2021   AST 20 06/24/2021   ALKPHOS 72 06/24/2021   BILITOT 0.3 06/24/2021   Lab Results  Component Value Date   HGBA1C 5.6 06/24/2021   HGBA1C 5.5 02/26/2021   HGBA1C 5.8 (H) 10/01/2020   Lab Results  Component Value Date   INSULIN 14.5 06/24/2021   INSULIN 25.5 (H) 02/26/2021   INSULIN 14.7 10/01/2020   Lab Results  Component Value Date   TSH 1.890 10/01/2020   Lab Results  Component Value Date   CHOL 167 06/24/2021   HDL 40 06/24/2021   LDLCALC 101 (H) 06/24/2021   TRIG 145 06/24/2021   CHOLHDL 5.1 (H) 03/18/2019   Lab Results  Component Value Date   VD25OH 43.3 06/24/2021   VD25OH 38.8 02/26/2021   VD25OH 25.0 (L) 10/01/2020   Lab Results  Component Value Date   WBC 5.0 01/28/2016   HGB 13.6 01/28/2016   HCT 39.8 01/28/2016   MCV 87.7 01/28/2016   PLT 132 (L) 01/28/2016   No results found for: IRON, TIBC, FERRITIN  Attestation Statements:   Reviewed by clinician on day of visit: allergies, medications, problem list, medical history, surgical history, family history, social history, and previous encounter notes.   I, Trixie Dredge, am acting as transcriptionist for Dennard Nip, MD.  I have reviewed the above documentation for accuracy and completeness, and I agree with the above. -  Dennard Nip, MD

## 2021-10-14 ENCOUNTER — Encounter (INDEPENDENT_AMBULATORY_CARE_PROVIDER_SITE_OTHER): Payer: Self-pay | Admitting: Nurse Practitioner

## 2021-10-14 ENCOUNTER — Other Ambulatory Visit (HOSPITAL_COMMUNITY): Payer: Self-pay

## 2021-10-14 ENCOUNTER — Ambulatory Visit (INDEPENDENT_AMBULATORY_CARE_PROVIDER_SITE_OTHER): Payer: 59 | Admitting: Nurse Practitioner

## 2021-10-14 ENCOUNTER — Other Ambulatory Visit: Payer: Self-pay

## 2021-10-14 VITALS — BP 123/76 | HR 85 | Temp 98.2°F | Ht 65.0 in | Wt 256.0 lb

## 2021-10-14 DIAGNOSIS — F3289 Other specified depressive episodes: Secondary | ICD-10-CM

## 2021-10-14 DIAGNOSIS — E66813 Obesity, class 3: Secondary | ICD-10-CM

## 2021-10-14 DIAGNOSIS — R7303 Prediabetes: Secondary | ICD-10-CM

## 2021-10-14 DIAGNOSIS — Z9189 Other specified personal risk factors, not elsewhere classified: Secondary | ICD-10-CM

## 2021-10-14 DIAGNOSIS — Z6841 Body Mass Index (BMI) 40.0 and over, adult: Secondary | ICD-10-CM | POA: Diagnosis not present

## 2021-10-14 DIAGNOSIS — E559 Vitamin D deficiency, unspecified: Secondary | ICD-10-CM

## 2021-10-14 DIAGNOSIS — E669 Obesity, unspecified: Secondary | ICD-10-CM | POA: Diagnosis not present

## 2021-10-14 MED ORDER — ESCITALOPRAM OXALATE 10 MG PO TABS
10.0000 mg | ORAL_TABLET | Freq: Every day | ORAL | 0 refills | Status: DC
  Filled 2021-10-14: qty 30, 30d supply, fill #0

## 2021-10-14 MED ORDER — SEMAGLUTIDE (2 MG/DOSE) 8 MG/3ML ~~LOC~~ SOPN
2.0000 mg | PEN_INJECTOR | SUBCUTANEOUS | 0 refills | Status: DC
  Filled 2021-10-14: qty 3, 28d supply, fill #0

## 2021-10-14 MED ORDER — VITAMIN D (ERGOCALCIFEROL) 1.25 MG (50000 UNIT) PO CAPS
50000.0000 [IU] | ORAL_CAPSULE | ORAL | 0 refills | Status: DC
  Filled 2021-10-14: qty 4, 28d supply, fill #0

## 2021-10-14 MED ORDER — METFORMIN HCL 500 MG PO TABS
500.0000 mg | ORAL_TABLET | Freq: Every day | ORAL | 1 refills | Status: DC
  Filled 2021-10-14: qty 30, 30d supply, fill #0

## 2021-10-16 NOTE — Progress Notes (Signed)
? ? ? ?Chief Complaint:  ? ?OBESITY ?Alexandra Jones is here to discuss her progress with her obesity treatment plan along with follow-up of her obesity related diagnoses. Alexandra Jones is on practicing portion control and making smarter food choices, such as increasing vegetables and decreasing simple carbohydrates and states she is following her eating plan approximately 25% of the time. Alexandra Jones states she is doing 0 minutes 0 times per week. ? ?Today's visit was #: 20 ?Starting weight: 250 lbs ?Starting date: 10/01/2020 ?Today's weight: 256 lbs ?Today's date: 10/14/2021 ?Total lbs lost to date: 0 ?Total lbs lost since last in-office visit: 0 ? ?Interim History: Alexandra Jones reports that things were going well until 2 weeks ago, when she went to [facility] [place] her birthday. Her grandfather passed away last week. She has been stress eating more since he passed away. She is not currently following a meal plan. She struggles with journaling and would like to discuss options today. ? ?Subjective:  ? ?1. Pre-diabetes ?Alexandra Jones's last A1c looked better at 5.6, and fasting insulin  was decreased from 25.5 to 14.5. She is taking Ozempic 2 mg and metformin 500 mg daily. She denies side effects. She reports reflux based upon what she eats. She notes it is worse with certain foods. ? ?2. Vitamin D deficiency ?Alexandra Jones's last Vit D was 43.3 on 06/23/2021, and it looks better. She is taking Vit D 50,000 IU weekly and she denies side effects. ? ?3. Other depression with emotional eating ?Alexandra Jones is taking Lexapro, and she denies side effects. ? ?4. At risk for impaired metabolic function ?Alexandra Jones is at increased risk for impaired metabolic function due to not getting in enough protein. ? ?Assessment/Plan:  ? ?1. Pre-diabetes ?We will refill Ozempic 2 mg for 1 month, and metformin 500 mg for 2 months. Side effects were discussed. She will continue to monitor for worsening of reflux. She will continue working on dietary changes and weight loss, and add in walking. ? ?-  metFORMIN (GLUCOPHAGE) 500 MG tablet; Take 1 tablet by mouth daily with breakfast.  Dispense: 30 tablet; Refill: 1 ?- Semaglutide, 2 MG/DOSE, 8 MG/3ML SOPN; Inject 2 mg into the skin once a week as directed.  Dispense: 3 mL; Refill: 0 ? ?2. Vitamin D deficiency ?We will refill prescription Vitamin D for 1 month. Alexandra Jones will follow-up for routine testing of Vitamin D, at least 2-3 times per year to avoid over-replacement.  ? ?- Vitamin D, Ergocalciferol, (DRISDOL) 1.25 MG (50000 UNIT) CAPS capsule; Take 1 capsule by mouth every 7 days.  Dispense: 4 capsule; Refill: 0 ? ?3. Other depression with emotional eating ?Behavior modification techniques were discussed today to help Alexandra Jones deal with her emotional/non-hunger eating behaviors. We will refill Lexapro for 1 month. Orders and follow up as documented in patient record.  ? ?- escitalopram (LEXAPRO) 10 MG tablet; Take 1 tablet (10 mg total) by mouth daily.  Dispense: 30 tablet; Refill: 0 ? ?4. At risk for impaired metabolic function ?Alexandra Jones was given approximately 15 minutes of impaired  metabolic function prevention counseling today. We discussed intensive lifestyle modifications today with an emphasis on specific nutrition and exercise instructions and strategies.  ? ?Repetitive spaced learning was employed today to elicit superior memory formation and behavioral change. ? ?5. Obesity with current BMI of 42.60 ?Alexandra Jones is currently in the action stage of change. As such, her goal is to continue with weight loss efforts. She has agreed to following a lower carbohydrate, vegetable and lean protein rich diet plan.  ? ?  Exercise goals: Encouraged walking. ? ?Behavioral modification strategies: increasing lean protein intake, increasing vegetables, increasing water intake, no skipping meals, and meal planning and cooking strategies. ? ?Alexandra Jones has agreed to follow-up with our clinic in 3 weeks. She was informed of the importance of frequent follow-up visits to maximize her  success with intensive lifestyle modifications for her multiple health conditions.  ? ?Objective:  ? ?Blood pressure 123/76, pulse 85, temperature 98.2 ?F (36.8 ?C), height '5\' 5"'$  (1.651 m), weight 256 lb (116.1 kg), SpO2 97 %. ?Body mass index is 42.6 kg/m?. ? ?General: Cooperative, alert, well developed, in no acute distress. ?HEENT: Conjunctivae and lids unremarkable. ?Cardiovascular: Regular rhythm.  ?Lungs: Normal work of breathing. ?Neurologic: No focal deficits.  ? ?Lab Results  ?Component Value Date  ? CREATININE 0.71 06/24/2021  ? BUN 9 06/24/2021  ? NA 141 06/24/2021  ? K 4.3 06/24/2021  ? CL 102 06/24/2021  ? CO2 24 06/24/2021  ? ?Lab Results  ?Component Value Date  ? ALT 28 06/24/2021  ? AST 20 06/24/2021  ? ALKPHOS 72 06/24/2021  ? BILITOT 0.3 06/24/2021  ? ?Lab Results  ?Component Value Date  ? HGBA1C 5.6 06/24/2021  ? HGBA1C 5.5 02/26/2021  ? HGBA1C 5.8 (H) 10/01/2020  ? ?Lab Results  ?Component Value Date  ? INSULIN 14.5 06/24/2021  ? INSULIN 25.5 (H) 02/26/2021  ? INSULIN 14.7 10/01/2020  ? ?Lab Results  ?Component Value Date  ? TSH 1.890 10/01/2020  ? ?Lab Results  ?Component Value Date  ? CHOL 167 06/24/2021  ? HDL 40 06/24/2021  ? LDLCALC 101 (H) 06/24/2021  ? TRIG 145 06/24/2021  ? CHOLHDL 5.1 (H) 03/18/2019  ? ?Lab Results  ?Component Value Date  ? VD25OH 43.3 06/24/2021  ? VD25OH 38.8 02/26/2021  ? VD25OH 25.0 (L) 10/01/2020  ? ?Lab Results  ?Component Value Date  ? WBC 5.0 01/28/2016  ? HGB 13.6 01/28/2016  ? HCT 39.8 01/28/2016  ? MCV 87.7 01/28/2016  ? PLT 132 (L) 01/28/2016  ? ?No results found for: IRON, TIBC, FERRITIN ? ?Attestation Statements:  ? ?Reviewed by clinician on day of visit: allergies, medications, problem list, medical history, surgical history, family history, social history, and previous encounter notes. ? ? ?I, Trixie Dredge, am acting as transcriptionist for Everardo Pacific, FNP. ? ?I have reviewed the above documentation for accuracy and completeness, and I agree  with the above. Everardo Pacific, FNP ? ? ?

## 2021-11-04 ENCOUNTER — Encounter (INDEPENDENT_AMBULATORY_CARE_PROVIDER_SITE_OTHER): Payer: Self-pay | Admitting: Family Medicine

## 2021-11-04 ENCOUNTER — Ambulatory Visit (INDEPENDENT_AMBULATORY_CARE_PROVIDER_SITE_OTHER): Payer: 59 | Admitting: Family Medicine

## 2021-11-04 ENCOUNTER — Other Ambulatory Visit (HOSPITAL_COMMUNITY): Payer: Self-pay

## 2021-11-04 ENCOUNTER — Other Ambulatory Visit: Payer: Self-pay

## 2021-11-04 VITALS — BP 112/73 | HR 67 | Temp 97.8°F | Ht 65.0 in | Wt 254.0 lb

## 2021-11-04 DIAGNOSIS — R7303 Prediabetes: Secondary | ICD-10-CM

## 2021-11-04 DIAGNOSIS — E782 Mixed hyperlipidemia: Secondary | ICD-10-CM | POA: Diagnosis not present

## 2021-11-04 DIAGNOSIS — E559 Vitamin D deficiency, unspecified: Secondary | ICD-10-CM

## 2021-11-04 DIAGNOSIS — Z6841 Body Mass Index (BMI) 40.0 and over, adult: Secondary | ICD-10-CM

## 2021-11-04 DIAGNOSIS — Z9189 Other specified personal risk factors, not elsewhere classified: Secondary | ICD-10-CM

## 2021-11-04 DIAGNOSIS — E669 Obesity, unspecified: Secondary | ICD-10-CM

## 2021-11-04 DIAGNOSIS — F3289 Other specified depressive episodes: Secondary | ICD-10-CM

## 2021-11-04 MED ORDER — ESCITALOPRAM OXALATE 10 MG PO TABS
10.0000 mg | ORAL_TABLET | Freq: Every day | ORAL | 0 refills | Status: DC
  Filled 2021-11-04: qty 30, 30d supply, fill #0

## 2021-11-04 MED ORDER — VITAMIN D (ERGOCALCIFEROL) 1.25 MG (50000 UNIT) PO CAPS
50000.0000 [IU] | ORAL_CAPSULE | ORAL | 0 refills | Status: DC
  Filled 2021-11-04: qty 4, 28d supply, fill #0

## 2021-11-04 MED ORDER — SEMAGLUTIDE (2 MG/DOSE) 8 MG/3ML ~~LOC~~ SOPN
2.0000 mg | PEN_INJECTOR | SUBCUTANEOUS | 0 refills | Status: DC
  Filled 2021-11-04: qty 3, 28d supply, fill #0

## 2021-11-04 MED ORDER — METFORMIN HCL 500 MG PO TABS
500.0000 mg | ORAL_TABLET | Freq: Every day | ORAL | 1 refills | Status: DC
  Filled 2021-11-04: qty 30, 30d supply, fill #0

## 2021-11-05 ENCOUNTER — Other Ambulatory Visit (HOSPITAL_COMMUNITY): Payer: Self-pay

## 2021-11-05 LAB — CMP14+EGFR
ALT: 17 IU/L (ref 0–32)
AST: 17 IU/L (ref 0–40)
Albumin/Globulin Ratio: 1.4 (ref 1.2–2.2)
Albumin: 4.3 g/dL (ref 3.9–5.0)
Alkaline Phosphatase: 73 IU/L (ref 44–121)
BUN/Creatinine Ratio: 11 (ref 9–23)
BUN: 9 mg/dL (ref 6–20)
Bilirubin Total: <0.2 mg/dL (ref 0.0–1.2)
CO2: 23 mmol/L (ref 20–29)
Calcium: 9.7 mg/dL (ref 8.7–10.2)
Chloride: 103 mmol/L (ref 96–106)
Creatinine, Ser: 0.82 mg/dL (ref 0.57–1.00)
Globulin, Total: 3 g/dL (ref 1.5–4.5)
Glucose: 86 mg/dL (ref 70–99)
Potassium: 4.3 mmol/L (ref 3.5–5.2)
Sodium: 141 mmol/L (ref 134–144)
Total Protein: 7.3 g/dL (ref 6.0–8.5)
eGFR: 100 mL/min/{1.73_m2} (ref >59)

## 2021-11-05 LAB — LIPID PANEL WITH LDL/HDL RATIO
Cholesterol, Total: 161 mg/dL (ref 100–199)
HDL: 40 mg/dL (ref >39)
LDL Chol Calc (NIH): 90 mg/dL (ref 0–99)
LDL/HDL Ratio: 2.3 ratio (ref 0.0–3.2)
Triglycerides: 181 mg/dL — ABNORMAL HIGH (ref 0–149)
VLDL Cholesterol Cal: 31 mg/dL (ref 5–40)

## 2021-11-05 LAB — HEMOGLOBIN A1C
Est. average glucose Bld gHb Est-mCnc: 111 mg/dL
Hgb A1c MFr Bld: 5.5 % (ref 4.8–5.6)

## 2021-11-05 LAB — VITAMIN D 25 HYDROXY (VIT D DEFICIENCY, FRACTURES): Vit D, 25-Hydroxy: 37.1 ng/mL (ref 30.0–100.0)

## 2021-11-05 LAB — INSULIN, RANDOM: INSULIN: 22.1 u[IU]/mL (ref 2.6–24.9)

## 2021-11-05 LAB — VITAMIN B12: Vitamin B-12: 475 pg/mL (ref 232–1245)

## 2021-11-05 LAB — TSH: TSH: 2.39 u[IU]/mL (ref 0.450–4.500)

## 2021-11-05 NOTE — Progress Notes (Signed)
? ? ? ?Chief Complaint:  ? ?OBESITY ?Alexandra Jones is here to discuss her progress with her obesity treatment plan along with follow-up of her obesity related diagnoses. Alexandra Jones is on following a lower carbohydrate, vegetable and lean protein rich diet plan and states she is following her eating plan approximately 60% of the time. [name] states she is doing 0 minutes 0 times per week. ? ?Today's visit was #: 21 ?Starting weight: 250 lbs ?Starting date: 05/01/2021 ?Today's weight: 254 lbs ?Today's date: 11/04/2021 ?Total lbs lost to date: 0 ?Total lbs lost since last in-office visit: 2 ? ?Interim History: [name] has done well with weight loss but she is struggling with excessive hunger and she is not feeling full, worse in the last 6 months.  ? ?Subjective:  ? ?1. Pre-diabetes ?Alexandra Jones is working on diet and weight loss, but she notes polyphagia has increased recently. ? ?2. Vitamin D deficiency ?Alexandra Jones is on Vitamin D, and she is due for labs. She is at risk of over-replacement.  ? ?3. Other hyperlipidemia ?Alexandra Jones is working on decreasing cholesterol in her, and she is due for labs. ? ?4. Other depression with emotional eating ?Alexandra Jones notes ongoing stress and emotional eating behaviors. She had tried a higher dose of Lexapro previously but she had increased side effects. ? ?5. At risk for diabetes mellitus ?Alexandra Jones is at higher than average risk for developing diabetes due to her obesity. ? ?Assessment/Plan:  ? ?1. Pre-diabetes ?We will check labs today. [name] will continue her medications, and we will refill metformin for 2 months and Ozempic for 1 month. ? ?- metFORMIN (GLUCOPHAGE) 500 MG tablet; Take 1 tablet by mouth daily with breakfast.  Dispense: 30 tablet; Refill: 1 ?- Semaglutide, 2 MG/DOSE, 8 MG/3ML SOPN; Inject 2 mg into the skin once a week as directed.  Dispense: 3 mL; Refill: 0 ?- CMP14+EGFR ?- Insulin, random ?- Hemoglobin A1c ?- TSH ? ?2. Vitamin D deficiency ?We will check labs today. [name] will continue prescription  Vitamin D, and we will refill for 1 month. ? ?- Vitamin D, Ergocalciferol, (DRISDOL) 1.25 MG (50000 UNIT) CAPS capsule; Take 1 capsule by mouth every 7 days.  Dispense: 4 capsule; Refill: 0 ?- VITAMIN D 25 Hydroxy (Vit-D Deficiency, Fractures) ?- Vitamin B12 ? ?3. Other hyperlipidemia ?We will check labs today. [name] will continue to work on diet, exercise and weight loss efforts. Orders and follow up as documented in patient record.  ? ?- Lipid Panel With LDL/HDL Ratio ? ?4. Other depression with emotional eating ?We will refill Lexapro for 1 month, and we will follow up at [facility] next visit. ? ?- escitalopram (LEXAPRO) 10 MG tablet; Take 1 tablet (10 mg total) by mouth daily.  Dispense: 30 tablet; Refill: 0 ? ?5. At risk for diabetes mellitus ?Alexandra Jones was given approximately 15 minutes of diabetic education and counseling today. We discussed intensive lifestyle modifications today with an emphasis on weight loss as well as increasing exercise and decreasing simple carbohydrates in her diet. We also reviewed medication options with an emphasis on risk versus benefits of those discussed. ? ?Repetitive spaced learning was employed today to elicit superior memory formation and behavioral change. ? ?6. Obesity with current BMI of 42.4 ?Alexandra Jones is currently in the action stage of change. As such, her goal is to continue with weight loss efforts. She has agreed to practicing portion control and making smarter food choices, such as increasing vegetables and decreasing simple carbohydrates.  ? ?Behavioral modification strategies: increasing lean protein  intake and dealing with family or coworker sabotage. ? ?Alexandra Jones has agreed to follow-up with our clinic in 3 weeks. She was informed of the importance of frequent follow-up visits to maximize her success with intensive lifestyle modifications for her multiple health conditions.  ? ?Alexandra Jones was informed we would discuss her lab results at her next visit unless there is a critical  issue that needs to be addressed sooner. Alexandra Jones agreed to keep her next visit at the agreed upon time to discuss these results. ? ?Objective:  ? ?Blood pressure 112/73, pulse 67, temperature 97.8 ?F (36.6 ?C), height '5\' 5"'  (1.651 m), weight 254 lb (115.2 kg), SpO2 97 %. ?Body mass index is 42.27 kg/m?. ? ?General: Cooperative, alert, well developed, in no acute distress. ?HEENT: Conjunctivae and lids unremarkable. ?Cardiovascular: Regular rhythm.  ?Lungs: Normal work of breathing. ?Neurologic: No focal deficits.  ? ?Lab Results  ?Component Value Date  ? CREATININE 0.82 11/04/2021  ? BUN 9 11/04/2021  ? NA 141 11/04/2021  ? K 4.3 11/04/2021  ? CL 103 11/04/2021  ? CO2 23 11/04/2021  ? ?Lab Results  ?Component Value Date  ? ALT 17 11/04/2021  ? AST 17 11/04/2021  ? ALKPHOS 73 11/04/2021  ? BILITOT <0.2 11/04/2021  ? ?Lab Results  ?Component Value Date  ? HGBA1C 5.5 11/04/2021  ? HGBA1C 5.6 06/24/2021  ? HGBA1C 5.5 02/26/2021  ? HGBA1C 5.8 (H) 10/01/2020  ? ?Lab Results  ?Component Value Date  ? INSULIN 22.1 11/04/2021  ? INSULIN 14.5 06/24/2021  ? INSULIN 25.5 (H) 02/26/2021  ? INSULIN 14.7 10/01/2020  ? ?Lab Results  ?Component Value Date  ? TSH 2.390 11/04/2021  ? ?Lab Results  ?Component Value Date  ? CHOL 161 11/04/2021  ? HDL 40 11/04/2021  ? Bradford 90 11/04/2021  ? TRIG 181 (H) 11/04/2021  ? CHOLHDL 5.1 (H) 03/18/2019  ? ?Lab Results  ?Component Value Date  ? VD25OH 37.1 11/04/2021  ? VD25OH 43.3 06/24/2021  ? VD25OH 38.8 02/26/2021  ? ?Lab Results  ?Component Value Date  ? WBC 5.0 01/28/2016  ? HGB 13.6 01/28/2016  ? HCT 39.8 01/28/2016  ? MCV 87.7 01/28/2016  ? PLT 132 (L) 01/28/2016  ? ?No results found for: IRON, TIBC, FERRITIN ? ?Attestation Statements:  ? ?Reviewed by clinician on day of visit: allergies, medications, problem list, medical history, surgical history, family history, social history, and previous encounter notes. ? ? ?I, Trixie Dredge, am acting as transcriptionist for Dennard Nip, MD. ? ?I  have reviewed the above documentation for accuracy and completeness, and I agree with the above. -  Dennard Nip, MD ? ? ?

## 2021-11-07 ENCOUNTER — Other Ambulatory Visit (HOSPITAL_COMMUNITY): Payer: Self-pay

## 2021-11-25 ENCOUNTER — Ambulatory Visit (INDEPENDENT_AMBULATORY_CARE_PROVIDER_SITE_OTHER): Payer: 59 | Admitting: Family Medicine

## 2021-11-25 ENCOUNTER — Other Ambulatory Visit (HOSPITAL_COMMUNITY): Payer: Self-pay

## 2021-11-25 ENCOUNTER — Encounter (INDEPENDENT_AMBULATORY_CARE_PROVIDER_SITE_OTHER): Payer: Self-pay | Admitting: Family Medicine

## 2021-11-25 VITALS — BP 121/69 | HR 71 | Temp 98.6°F | Ht 65.0 in | Wt 257.0 lb

## 2021-11-25 DIAGNOSIS — R7303 Prediabetes: Secondary | ICD-10-CM | POA: Diagnosis not present

## 2021-11-25 DIAGNOSIS — Z6841 Body Mass Index (BMI) 40.0 and over, adult: Secondary | ICD-10-CM

## 2021-11-25 DIAGNOSIS — E669 Obesity, unspecified: Secondary | ICD-10-CM | POA: Diagnosis not present

## 2021-11-25 DIAGNOSIS — E559 Vitamin D deficiency, unspecified: Secondary | ICD-10-CM

## 2021-11-25 DIAGNOSIS — Z9189 Other specified personal risk factors, not elsewhere classified: Secondary | ICD-10-CM

## 2021-11-25 DIAGNOSIS — F3289 Other specified depressive episodes: Secondary | ICD-10-CM

## 2021-11-25 MED ORDER — ESCITALOPRAM OXALATE 5 MG PO TABS
5.0000 mg | ORAL_TABLET | Freq: Every day | ORAL | 0 refills | Status: DC
  Filled 2021-11-25: qty 30, 30d supply, fill #0

## 2021-11-25 MED ORDER — ESCITALOPRAM OXALATE 10 MG PO TABS
10.0000 mg | ORAL_TABLET | Freq: Every day | ORAL | 0 refills | Status: DC
  Filled 2021-11-25 – 2021-12-01 (×2): qty 30, 30d supply, fill #0

## 2021-11-25 MED ORDER — WEGOVY 2.4 MG/0.75ML ~~LOC~~ SOAJ
2.4000 mg | SUBCUTANEOUS | 0 refills | Status: DC
  Filled 2021-11-25: qty 3, 28d supply, fill #0

## 2021-11-25 MED ORDER — VITAMIN D (ERGOCALCIFEROL) 1.25 MG (50000 UNIT) PO CAPS
50000.0000 [IU] | ORAL_CAPSULE | ORAL | 0 refills | Status: DC
  Filled 2021-11-25 – 2021-12-01 (×2): qty 4, 28d supply, fill #0

## 2021-11-25 MED ORDER — METFORMIN HCL 500 MG PO TABS
500.0000 mg | ORAL_TABLET | Freq: Every day | ORAL | 1 refills | Status: DC
  Filled 2021-11-25 – 2021-12-01 (×2): qty 30, 30d supply, fill #0

## 2021-12-02 ENCOUNTER — Other Ambulatory Visit (HOSPITAL_COMMUNITY): Payer: Self-pay

## 2021-12-04 ENCOUNTER — Other Ambulatory Visit (HOSPITAL_COMMUNITY): Payer: Self-pay

## 2021-12-05 NOTE — Progress Notes (Signed)
? ? ? ?Chief Complaint:  ? ?OBESITY ?Alexandra Jones is here to discuss her progress with her obesity treatment plan along with follow-up of her obesity related diagnoses. Alexandra Jones is on practicing portion control and making smarter food choices, such as increasing vegetables and decreasing simple carbohydrates and states she is following her eating plan approximately 60% of the time. Alexandra Jones states she is doing 0 minutes 0 times per week. ? ?Today's visit was #: 59 ?Starting weight: 250 lbs ?Starting date: 05/01/2021 ?Today's weight: 257 lbs ?Today's date: 11/25/2021 ?Total lbs lost to date: 0 ?Total lbs lost since last in-office visit: 0 ? ?Interim History: Alexandra Jones continues to work on weight loss. She is retaining some fluid today. She struggles with journaling every day, especially with meal planning.  ? ?Subjective:  ? ?1. Pre-diabetes ?Alexandra Jones is on Ozempic 2 mg and she is tolerating it well. She needs to change to [facility] for insurance reasons.  ? ?2. Vitamin D deficiency ?Alexandra Jones's Vitamin D level is not yet at goal. No side effects were noted.  ? ?3. Other depression with emotional eating ?Alexandra Jones is working on Barista behaviors, and she had been on 20 mg of Lexapro previously but she had some side effects. She is willing to try a little higher dose.  ? ?4. At risk for impaired metabolic function ?Alexandra Jones is at increased risk for impaired metabolic function if calories are too low.  ? ?Assessment/Plan:  ? ?1. Pre-diabetes ?Alexandra Jones agreed to discontinue Ozempic, and will start Wegovy. We will refill metformin 500 mg for 2 months.  ? ?- metFORMIN (GLUCOPHAGE) 500 MG tablet; Take 1 tablet by mouth daily with breakfast.  Dispense: 30 tablet; Refill: 1 ?- Semaglutide-Weight Management (WEGOVY) 2.4 MG/0.75ML SOAJ; Inject 2.4 mg into the skin once a week.  Dispense: 3 mL; Refill: 0 ? ?2. Vitamin D deficiency ?We will refill prescription Vitamin D for 1 month. Alexandra Jones will follow-up for routine testing of Vitamin D, at least 2-3 times  per year to avoid over-replacement. ? ?- Vitamin D, Ergocalciferol, (DRISDOL) 1.25 MG (50000 UNIT) CAPS capsule; Take 1 capsule by mouth every 7 days.  Dispense: 4 capsule; Refill: 0 ? ?3. Other depression with emotional eating ?We will refill Lexapro 10 mg daily for 1 month, and Alexandra Jones agreed to start Lexapro 5 mg daily, with no refills.  ? ?- escitalopram (LEXAPRO) 10 MG tablet; Take 1 tablet (10 mg total) by mouth daily.  Dispense: 30 tablet; Refill: 0 ?- escitalopram (LEXAPRO) 5 MG tablet; Take 1 tablet (5 mg total) by mouth daily.  Dispense: 30 tablet; Refill: 0 ? ?4. At risk for impaired metabolic function ?Alexandra Jones was given approximately 15 minutes of impaired  metabolic function prevention counseling today. We discussed intensive lifestyle modifications today with an emphasis on specific nutrition and exercise instructions and strategies.  ? ?Repetitive spaced learning was employed today to elicit superior memory formation and behavioral change. ? ?5. Obesity current BMI 44.2 ?Alexandra Jones is currently in the action stage of change. As such, her goal is to continue with weight loss efforts. She has agreed to following a lower carbohydrate, vegetable and lean protein rich diet plan.  ? ?We discussed various medication options to help Alexandra Jones with her weight loss efforts and we both agreed to start Wegovy 2.4 mg q week with no refills. ? ?- Semaglutide-Weight Management (WEGOVY) 2.4 MG/0.75ML SOAJ; Inject 2.4 mg into the skin once a week.  Dispense: 3 mL; Refill: 0 ? ?Behavioral modification strategies: increasing lean protein intake and  meal planning and cooking strategies. ? ?Alexandra Jones has agreed to follow-up with our clinic in 3 weeks. She was informed of the importance of frequent follow-up visits to maximize her success with intensive lifestyle modifications for her multiple health conditions.  ? ?Objective:  ? ?Blood pressure 121/69, pulse 71, temperature 98.6 ?F (37 ?C), height '5\' 5"'$  (1.651 m), weight 257 lb (116.6  kg), SpO2 97 %. ?Body mass index is 42.77 kg/m?. ? ?General: Cooperative, alert, well developed, in no acute distress. ?HEENT: Conjunctivae and lids unremarkable. ?Cardiovascular: Regular rhythm.  ?Lungs: Normal work of breathing. ?Neurologic: No focal deficits.  ? ?Lab Results  ?Component Value Date  ? CREATININE 0.82 11/04/2021  ? BUN 9 11/04/2021  ? NA 141 11/04/2021  ? K 4.3 11/04/2021  ? CL 103 11/04/2021  ? CO2 23 11/04/2021  ? ?Lab Results  ?Component Value Date  ? ALT 17 11/04/2021  ? AST 17 11/04/2021  ? ALKPHOS 73 11/04/2021  ? BILITOT <0.2 11/04/2021  ? ?Lab Results  ?Component Value Date  ? HGBA1C 5.5 11/04/2021  ? HGBA1C 5.6 06/24/2021  ? HGBA1C 5.5 02/26/2021  ? HGBA1C 5.8 (H) 10/01/2020  ? ?Lab Results  ?Component Value Date  ? INSULIN 22.1 11/04/2021  ? INSULIN 14.5 06/24/2021  ? INSULIN 25.5 (H) 02/26/2021  ? INSULIN 14.7 10/01/2020  ? ?Lab Results  ?Component Value Date  ? TSH 2.390 11/04/2021  ? ?Lab Results  ?Component Value Date  ? CHOL 161 11/04/2021  ? HDL 40 11/04/2021  ? [place] 90 11/04/2021  ? TRIG 181 (H) 11/04/2021  ? CHOLHDL 5.1 (H) 03/18/2019  ? ?Lab Results  ?Component Value Date  ? VD25OH 37.1 11/04/2021  ? VD25OH 43.3 06/24/2021  ? VD25OH 38.8 02/26/2021  ? ?Lab Results  ?Component Value Date  ? WBC 5.0 01/28/2016  ? HGB 13.6 01/28/2016  ? HCT 39.8 01/28/2016  ? MCV 87.7 01/28/2016  ? PLT 132 (L) 01/28/2016  ? ?No results found for: IRON, TIBC, FERRITIN ? ?Attestation Statements:  ? ?Reviewed by clinician on day of visit: allergies, medications, problem list, medical history, surgical history, family history, social history, and previous encounter notes. ? ? ?I, Trixie Dredge, am acting as transcriptionist for Dennard Nip, MD. ? ?I have reviewed the above documentation for accuracy and completeness, and I agree with the above. -  Dennard Nip, MD ? ? ?

## 2021-12-08 ENCOUNTER — Telehealth: Payer: Self-pay | Admitting: Nurse Practitioner

## 2021-12-08 DIAGNOSIS — N3000 Acute cystitis without hematuria: Secondary | ICD-10-CM

## 2021-12-09 ENCOUNTER — Encounter (INDEPENDENT_AMBULATORY_CARE_PROVIDER_SITE_OTHER): Payer: Self-pay

## 2021-12-09 ENCOUNTER — Telehealth (INDEPENDENT_AMBULATORY_CARE_PROVIDER_SITE_OTHER): Payer: Self-pay | Admitting: Family Medicine

## 2021-12-09 MED ORDER — NITROFURANTOIN MONOHYD MACRO 100 MG PO CAPS: 100.0000 mg | ORAL_CAPSULE | Freq: Two times a day (BID) | ORAL | 0 refills | Status: AC

## 2021-12-09 NOTE — Progress Notes (Signed)
E-Visit for Urinary Problems  We are sorry that you are not feeling well.  Here is how we plan to help!  Based on what you shared with me it looks like you most likely have a simple urinary tract infection.  A UTI (Urinary Tract Infection) is a bacterial infection of the bladder.  Most cases of urinary tract infections are simple to treat but a key part of your care is to encourage you to drink plenty of fluids and watch your symptoms carefully.  I have prescribed MacroBid 100 mg twice a day for 5 days.  Your symptoms should gradually improve. Call us if the burning in your urine worsens, you develop worsening fever, back pain or pelvic pain or if your symptoms do not resolve after completing the antibiotic.  Urinary tract infections can be prevented by drinking plenty of water to keep your body hydrated.  Also be sure when you wipe, wipe from front to back and don't hold it in!  If possible, empty your bladder every 4 hours.  HOME CARE Drink plenty of fluids Compete the full course of the antibiotics even if the symptoms resolve Remember, when you need to go.go. Holding in your urine can increase the likelihood of getting a UTI! GET HELP RIGHT AWAY IF: You cannot urinate You get a high fever Worsening back pain occurs You see blood in your urine You feel sick to your stomach or throw up You feel like you are going to pass out  MAKE SURE YOU  Understand these instructions. Will watch your condition. Will get help right away if you are not doing well or get worse.   Thank you for choosing an e-visit.  Your e-visit answers were reviewed by a board certified advanced clinical practitioner to complete your personal care plan. Depending upon the condition, your plan could have included both over the counter or prescription medications.  Please review your pharmacy choice. Make sure the pharmacy is open so you can pick up prescription now. If there is a problem, you may contact your  provider through MyChart messaging and have the prescription routed to another pharmacy.  Your safety is important to us. If you have drug allergies check your prescription carefully.   For the next 24 hours you can use MyChart to ask questions about today's visit, request a non-urgent call back, or ask for a work or school excuse. You will get an email in the next two days asking about your experience. I hope that your e-visit has been valuable and will speed your recovery.   I spent approximately 5 minutes reviewing the patient's history, current symptoms and coordinating their plan of care today.   Meds ordered this encounter  Medications   nitrofurantoin, macrocrystal-monohydrate, (MACROBID) 100 MG capsule    Sig: Take 1 capsule (100 mg total) by mouth 2 (two) times daily for 5 days.    Dispense:  10 capsule    Refill:  0    

## 2021-12-09 NOTE — Telephone Encounter (Signed)
Prior authorization approved for [facility]. Effective: 12/08/2021 to 07/06/2022. Patient sent approval message via mychart.  ?

## 2021-12-18 ENCOUNTER — Ambulatory Visit (INDEPENDENT_AMBULATORY_CARE_PROVIDER_SITE_OTHER): Payer: 59 | Admitting: Family Medicine

## 2021-12-24 ENCOUNTER — Other Ambulatory Visit (HOSPITAL_COMMUNITY): Payer: Self-pay

## 2021-12-24 ENCOUNTER — Encounter: Payer: Self-pay | Admitting: Family Medicine

## 2021-12-24 ENCOUNTER — Ambulatory Visit: Payer: 59 | Admitting: Family Medicine

## 2021-12-24 VITALS — BP 127/77 | HR 89 | Temp 98.3°F | Ht 65.0 in | Wt 256.0 lb

## 2021-12-24 DIAGNOSIS — I10 Essential (primary) hypertension: Secondary | ICD-10-CM

## 2021-12-24 MED ORDER — AMLODIPINE BESYLATE 10 MG PO TABS
10.0000 mg | ORAL_TABLET | Freq: Every day | ORAL | 0 refills | Status: DC
  Filled 2021-12-24: qty 90, 90d supply, fill #0

## 2021-12-24 NOTE — Progress Notes (Signed)
?  ? ?Subjective:  ?Patient ID: Alexandra Jones, female    DOB: 07-24-1996, 26 y.o.   MRN: [id number] ? ?Patient Care Team: ?Loman Brooklyn, FNP as PCP - General (Family Medicine) ?Fields, Marga Melnick, MD (Inactive) as Consulting Physician (Gastroenterology) ?Jonnie Kind, MD (Inactive) as Consulting Physician (Obstetrics and Gynecology)  ? ?Chief Complaint:  Hypertension ? ? ?HPI: ?Alexandra Jones is a 26 y.o. female presenting on 12/24/2021 for Hypertension ? ? ?Pt presents today with complaints of elevated blood pressure readings over the last several days, gradually improving. States she was on a girls trip and was not following a strict diet, was drinking alcohol. States since she has been home, she has adjusted her diet significantly with improvement in her readings. She does not have her machine with her today. Has increased her norvasc dosing to 10 mg daily over the last few days.  ?She states readings were as high as 190/90. Did report some neck tightness with the elevated readings.  ? ?Hypertension ?This is a chronic problem. The problem has been waxing and waning since onset. Associated symptoms include headaches and neck pain. Pertinent negatives include no anxiety, blurred vision, chest pain, malaise/fatigue, orthopnea, palpitations, peripheral edema, PND, shortness of breath or sweats. Risk factors for coronary artery disease include obesity. Past treatments include calcium channel blockers. Compliance problems include diet.   ? ? ?Relevant past medical, surgical, family, and social history reviewed and updated as indicated.  ?Allergies and medications reviewed and updated. Data reviewed: Chart in Epic. ? ? ?Past Medical History:  ?Diagnosis Date  ? Anxiety disorder 03/14/2019  ? Last Assessment & Plan:  Controlled with Lexapro 5 mg PO QD.  ? Diarrhea 05/01/2017  ? GERD (gastroesophageal reflux disease)   ? High blood pressure   ? Lactose intolerance   ? ? ?Past Surgical History:  ?Procedure Laterality Date   ? CHOLECYSTECTOMY    ? COLONOSCOPY N/A 06/22/2017  ? Procedure: COLONOSCOPY;  Surgeon: Danie Binder, MD;  Location: [facility];  Service: Endoscopy;  Laterality: N/A;  1:45pm  ? wisdom teeth extraction    ? ? ?Social History  ? ?Socioeconomic History  ? Marital status: Single  ?  Spouse name: Not on file  ? Number of children: Not on file  ? Years of education: Not on file  ? Highest education level: Not on file  ?Occupational History  ? Occupation: Warden/ranger  ?Tobacco Use  ? Smoking status: Never  ? Smokeless tobacco: Never  ?Vaping Use  ? Vaping Use: Never used  ?Substance and Sexual Activity  ? Alcohol use: Yes  ?  Alcohol/week: 0.0 standard drinks  ?  Comment: occasional  ? Drug use: No  ? Sexual activity: Yes  ?  Birth control/protection: I.U.D.  ?Other Topics Concern  ? Not on file  ?Social History Narrative  ? Not on file  ? ?Social Determinants of Health  ? ?Financial Resource Strain: Not on file  ?Food Insecurity: Not on file  ?Transportation Needs: Not on file  ?Physical Activity: Not on file  ?Stress: Not on file  ?Social Connections: Not on file  ?Intimate Partner Violence: Not on file  ? ? ?Outpatient Encounter Medications as of 12/24/2021  ?Medication Sig  ? acetaminophen (TYLENOL) 500 MG tablet Take 500 mg by mouth every 6 (six) hours as needed.  ? amLODipine (NORVASC) 10 MG tablet Take 1 tablet (10 mg total) by mouth daily.  ? Biotin 5 MG CAPS Take by mouth.  ?  calcium carbonate (TUMS - DOSED IN MG ELEMENTAL CALCIUM) 500 MG chewable tablet Chew 1 tablet by mouth daily.  ? Emollient (COLLAGEN EX) Apply topically. 1  scoop  ? escitalopram (LEXAPRO) 10 MG tablet Take 1 tablet (10 mg total) by mouth daily.  ? escitalopram (LEXAPRO) 5 MG tablet Take 1 tablet (5 mg total) by mouth daily.  ? famotidine (PEPCID) 20 MG tablet Take 20 mg by mouth as needed.   ? ibuprofen (ADVIL) 400 MG tablet Take 400 mg by mouth every 6 (six) hours as needed.  ? loratadine (CLARITIN) 10 MG tablet Take 10 mg  by mouth daily.  ? Melatonin-Pyridoxine (MELATONIN TR) 5-10 MG TBCR Take by mouth.  ? metFORMIN (GLUCOPHAGE) 500 MG tablet Take 1 tablet by mouth daily with breakfast.  ? Multiple Vitamin (MULTI-VITAMINS) TABS Take 1 tablet by mouth.  ? Semaglutide-Weight Management (WEGOVY) 2.4 MG/0.75ML SOAJ Inject 2.4 mg into the skin once a week.  ? Vitamin D, Ergocalciferol, (DRISDOL) 1.25 MG (50000 UNIT) CAPS capsule Take 1 capsule by mouth every 7 days.  ? [DISCONTINUED] amLODipine (NORVASC) 5 MG tablet Take 1 and 1/2 tablets (7.5 mg total) by mouth daily.  ? ?No facility-administered encounter medications on file as of 12/24/2021.  ? ? ?Allergies  ?Allergen Reactions  ? Hydrocodone Hives  ? Penicillins Rash  ?  fever ?Has patient had a PCN reaction causing immediate rash, facial/tongue/throat swelling, SOB or lightheadedness with hypotension: Yes ?Has patient had a PCN reaction causing severe rash involving mucus membranes or skin necrosis: No ?Has patient had a PCN reaction that required hospitalization: No ?Has patient had a PCN reaction occurring within the last 10 years: Yes ?If all of the above answers are "NO", then may proceed with Cephalosporin use. ?  ? ? ?Review of Systems  ?Constitutional:  Negative for activity change, appetite change, chills, diaphoresis, fatigue, fever, malaise/fatigue and unexpected weight change.  ?HENT:  Negative for congestion.   ?Eyes:  Negative for blurred vision, photophobia and visual disturbance.  ?Respiratory:  Negative for cough, chest tightness and shortness of breath.   ?Cardiovascular:  Positive for leg swelling (resolved). Negative for chest pain, palpitations, orthopnea and PND.  ?Gastrointestinal:  Negative for abdominal pain.  ?Endocrine: Negative for cold intolerance, heat intolerance, polydipsia, polyphagia and polyuria.  ?Genitourinary:  Negative for decreased urine volume and difficulty urinating.  ?Musculoskeletal:  Positive for neck pain. Negative for arthralgias, back  pain, gait problem, joint swelling and myalgias.  ?Skin: Negative.   ?Neurological:  Positive for headaches. Negative for dizziness, tremors, seizures, syncope, facial asymmetry, speech difficulty, weakness, light-headedness and numbness.  ?Psychiatric/Behavioral:  Negative for confusion.   ?All other systems reviewed and are negative. ? ?   ? ?Objective:  ?BP 127/77   Pulse 89   Temp 98.3 ?F (36.8 ?C)   Ht '5\' 5"'$  (1.651 m)   Wt 256 lb (116.1 kg)   LMP 11/22/2021 (Exact Date)   SpO2 95%   BMI 42.60 kg/m?   ? ?Wt Readings from Last 3 Encounters:  ?12/24/21 256 lb (116.1 kg)  ?11/25/21 257 lb (116.6 kg)  ?11/04/21 254 lb (115.2 kg)  ? ? ?Physical Exam ?Vitals and nursing note reviewed.  ?Constitutional:   ?   General: She is not in acute distress. ?   Appearance: Normal appearance. She is well-developed and well-groomed. She is morbidly obese. She is not ill-appearing, toxic-appearing or diaphoretic.  ?HENT:  ?   Head: Normocephalic and atraumatic.  ?   Jaw: There is  normal jaw occlusion.  ?   Right Ear: Hearing normal.  ?   Left Ear: Hearing normal.  ?   Nose: Nose normal.  ?   Mouth/Throat:  ?   Lips: Pink.  ?   Mouth: Mucous membranes are moist.  ?   Pharynx: Oropharynx is clear. Uvula midline.  ?Eyes:  ?   General: Lids are normal.  ?   Extraocular Movements: Extraocular movements intact.  ?   Conjunctiva/sclera: Conjunctivae normal.  ?   Pupils: Pupils are equal, round, and reactive to light.  ?Neck:  ?   Thyroid: No thyroid mass, thyromegaly or thyroid tenderness.  ?   Vascular: No carotid bruit or JVD.  ?   Trachea: Trachea and phonation normal.  ?Cardiovascular:  ?   Rate and Rhythm: Normal rate and regular rhythm.  ?   Chest Wall: PMI is not displaced.  ?   Pulses: Normal pulses.  ?   Heart sounds: Normal heart sounds. No murmur heard. ?  No friction rub. No gallop.  ?Pulmonary:  ?   Effort: Pulmonary effort is normal.  ?   Breath sounds: Normal breath sounds.  ?Abdominal:  ?   General: Bowel sounds are  normal. There is no abdominal bruit.  ?   Palpations: Abdomen is soft. There is no hepatomegaly or splenomegaly.  ?Musculoskeletal:     ?   General: Normal range of motion.  ?   Cervical back: Normal range

## 2021-12-24 NOTE — Patient Instructions (Signed)

## 2021-12-25 LAB — BMP8+EGFR
BUN/Creatinine Ratio: 20 (ref 9–23)
BUN: 16 mg/dL (ref 6–20)
CO2: 17 mmol/L — ABNORMAL LOW (ref 20–29)
Calcium: 9.8 mg/dL (ref 8.7–10.2)
Chloride: 100 mmol/L (ref 96–106)
Creatinine, Ser: 0.79 mg/dL (ref 0.57–1.00)
Glucose: 85 mg/dL (ref 70–99)
Potassium: 4.2 mmol/L (ref 3.5–5.2)
Sodium: 141 mmol/L (ref 134–144)
eGFR: 104 mL/min/{1.73_m2} (ref >59)

## 2021-12-27 ENCOUNTER — Other Ambulatory Visit (HOSPITAL_COMMUNITY): Payer: Self-pay

## 2021-12-28 ENCOUNTER — Other Ambulatory Visit (HOSPITAL_COMMUNITY): Payer: Self-pay

## 2022-01-07 ENCOUNTER — Ambulatory Visit (INDEPENDENT_AMBULATORY_CARE_PROVIDER_SITE_OTHER): Payer: 59 | Admitting: Family Medicine

## 2022-01-07 ENCOUNTER — Encounter (INDEPENDENT_AMBULATORY_CARE_PROVIDER_SITE_OTHER): Payer: Self-pay | Admitting: Family Medicine

## 2022-01-07 ENCOUNTER — Other Ambulatory Visit (HOSPITAL_COMMUNITY): Payer: Self-pay

## 2022-01-07 VITALS — BP 105/69 | HR 55 | Temp 97.9°F | Ht 65.0 in | Wt 250.0 lb

## 2022-01-07 DIAGNOSIS — E559 Vitamin D deficiency, unspecified: Secondary | ICD-10-CM

## 2022-01-07 DIAGNOSIS — R7303 Prediabetes: Secondary | ICD-10-CM

## 2022-01-07 DIAGNOSIS — E669 Obesity, unspecified: Secondary | ICD-10-CM

## 2022-01-07 DIAGNOSIS — Z7984 Long term (current) use of oral hypoglycemic drugs: Secondary | ICD-10-CM

## 2022-01-07 DIAGNOSIS — Z6841 Body Mass Index (BMI) 40.0 and over, adult: Secondary | ICD-10-CM | POA: Diagnosis not present

## 2022-01-07 DIAGNOSIS — F3289 Other specified depressive episodes: Secondary | ICD-10-CM

## 2022-01-07 DIAGNOSIS — Z9189 Other specified personal risk factors, not elsewhere classified: Secondary | ICD-10-CM

## 2022-01-07 MED ORDER — VITAMIN D (ERGOCALCIFEROL) 1.25 MG (50000 UNIT) PO CAPS
50000.0000 [IU] | ORAL_CAPSULE | ORAL | 0 refills | Status: DC
  Filled 2022-01-07: qty 4, 28d supply, fill #0

## 2022-01-07 MED ORDER — ESCITALOPRAM OXALATE 5 MG PO TABS
5.0000 mg | ORAL_TABLET | Freq: Every day | ORAL | 0 refills | Status: DC
  Filled 2022-01-07: qty 30, 30d supply, fill #0

## 2022-01-07 MED ORDER — METFORMIN HCL 500 MG PO TABS
500.0000 mg | ORAL_TABLET | Freq: Every day | ORAL | 1 refills | Status: DC
  Filled 2022-01-07: qty 30, 30d supply, fill #0
  Filled 2022-02-12: qty 30, 30d supply, fill #1

## 2022-01-07 MED ORDER — ESCITALOPRAM OXALATE 10 MG PO TABS
10.0000 mg | ORAL_TABLET | Freq: Every day | ORAL | 0 refills | Status: DC
  Filled 2022-01-07: qty 30, 30d supply, fill #0

## 2022-01-08 ENCOUNTER — Other Ambulatory Visit (HOSPITAL_COMMUNITY): Payer: Self-pay

## 2022-01-14 ENCOUNTER — Ambulatory Visit: Payer: 59 | Admitting: Family Medicine

## 2022-01-14 ENCOUNTER — Encounter: Payer: Self-pay | Admitting: Family Medicine

## 2022-01-14 NOTE — Progress Notes (Unsigned)
Chief Complaint:   OBESITY Alexandra Jones is here to discuss her progress with her obesity treatment plan along with follow-up of her obesity related diagnoses. Akiera is on following a lower carbohydrate, vegetable and lean protein rich diet plan and states she is following her eating plan approximately 90% of the time. Jazalynn states she is doing 0 minutes 0 times per week.  Today's visit was #: 22 Starting weight: 250 lbs Starting date: 05/01/2021 Today's weight: 250 lbs Today's date: 01/07/2022 Total lbs lost to date: 0 Total lbs lost since last in-office visit: 7  Interim History: Alexandra Jones has done very well with weight loss by keeping a food journal. Her hunger is controlled and she is meeting her calories/protein goals regularly. She just got Wegovy last week but hasn't started it yet.  Subjective:   1. Vitamin D deficiency Alexandra Jones is doing well on Vitamin D, and she requests a refill today.   2. Pre-diabetes Alexandra Jones is on metformin and she is working on her diet. No side effects were noted. She has no signs of hypoglycemia.   3. Other depression with emotional eating Alexandra Jones is stable on her medications. She is doing well with identifying and decreasing emotional eating behaviors. She denies side effects.   4. At risk for heart disease Alexandra Jones is at higher than average risk for cardiovascular disease due to obesity.  Assessment/Plan:   1. Vitamin D deficiency We will refill prescription Vitamin D for 1 month. Alexandra Jones will follow-up for routine testing of Vitamin D, at least 2-3 times per year to avoid over-replacement.  - Vitamin D, Ergocalciferol, (DRISDOL) 1.25 MG (50000 UNIT) CAPS capsule; Take 1 capsule by mouth every 7 days.  Dispense: 4 capsule; Refill: 0  2. Pre-diabetes We will refill metformin for 1 month. Finesse will continue to work on weight loss, exercise, and decreasing simple carbohydrates to help decrease the risk of diabetes.   - metFORMIN (GLUCOPHAGE) 500 MG tablet; Take  1 tablet by mouth daily with breakfast.  Dispense: 30 tablet; Refill: 1  3. Other depression with emotional eating We will refill Lexapro at 10 mg and 5 mg for 1 month. Behavior modification techniques were discussed today to help Alexandra Jones deal with her emotional/non-hunger eating behaviors.  Orders and follow up as documented in patient record.   - escitalopram (LEXAPRO) 10 MG tablet; Take 1 tablet (10 mg total) by mouth daily.  Dispense: 30 tablet; Refill: 0 - escitalopram (LEXAPRO) 5 MG tablet; Take 1 tablet (5 mg total) by mouth daily.  Dispense: 30 tablet; Refill: 0  4. At risk for heart disease Alexandra Jones was given approximately 15 minutes of coronary artery disease prevention counseling today. She is 26 y.o. female and has risk factors for heart disease including obesity. We discussed intensive lifestyle modifications today with an emphasis on specific weight loss instructions and strategies.  Repetitive spaced learning was employed today to elicit superior memory formation and behavioral change.   5. Obesity, Current BMI 41.6 Alexandra Jones is currently in the action stage of change. As such, her goal is to continue with weight loss efforts. She has agreed to keeping a food journal and adhering to recommended goals of 1300-1400 calories and 80+ grams of protein daily.   Behavioral modification strategies: increasing lean protein intake, meal planning and cooking strategies, and travel eating strategies.  Alexandra Jones has agreed to follow-up with our clinic in 3 to 4 weeks. She was informed of the importance of frequent follow-up visits to maximize her success with  intensive lifestyle modifications for her multiple health conditions.   Objective:   Blood pressure 105/69, pulse (!) 55, temperature 97.9 F (36.6 C), height '5\' 5"'$  (1.651 m), weight 250 lb (113.4 kg), SpO2 98 %. Body mass index is 41.6 kg/m.  General: Cooperative, alert, well developed, in no acute distress. HEENT: Conjunctivae and lids  unremarkable. Cardiovascular: Regular rhythm.  Lungs: Normal work of breathing. Neurologic: No focal deficits.   Lab Results  Component Value Date   CREATININE 0.79 12/24/2021   BUN 16 12/24/2021   NA 141 12/24/2021   K 4.2 12/24/2021   CL 100 12/24/2021   CO2 17 (L) 12/24/2021   Lab Results  Component Value Date   ALT 17 11/04/2021   AST 17 11/04/2021   ALKPHOS 73 11/04/2021   BILITOT <0.2 11/04/2021   Lab Results  Component Value Date   HGBA1C 5.5 11/04/2021   HGBA1C 5.6 06/24/2021   HGBA1C 5.5 02/26/2021   HGBA1C 5.8 (H) 10/01/2020   Lab Results  Component Value Date   INSULIN 22.1 11/04/2021   INSULIN 14.5 06/24/2021   INSULIN 25.5 (H) 02/26/2021   INSULIN 14.7 10/01/2020   Lab Results  Component Value Date   TSH 2.390 11/04/2021   Lab Results  Component Value Date   CHOL 161 11/04/2021   HDL 40 11/04/2021   LDLCALC 90 11/04/2021   TRIG 181 (H) 11/04/2021   CHOLHDL 5.1 (H) 03/18/2019   Lab Results  Component Value Date   VD25OH 37.1 11/04/2021   VD25OH 43.3 06/24/2021   VD25OH 38.8 02/26/2021   Lab Results  Component Value Date   WBC 5.0 01/28/2016   HGB 13.6 01/28/2016   HCT 39.8 01/28/2016   MCV 87.7 01/28/2016   PLT 132 (L) 01/28/2016   No results found for: IRON, TIBC, FERRITIN  Attestation Statements:   Reviewed by clinician on day of visit: allergies, medications, problem list, medical history, surgical history, family history, social history, and previous encounter notes.   I, Trixie Dredge, am acting as transcriptionist for Dennard Nip, MD.  I have reviewed the above documentation for accuracy and completeness, and I agree with the above. -  Dennard Nip, MD

## 2022-02-04 ENCOUNTER — Other Ambulatory Visit (HOSPITAL_COMMUNITY)
Admission: RE | Admit: 2022-02-04 | Discharge: 2022-02-04 | Disposition: A | Discharge status: Home / Self Care | Payer: 59 | Source: Ambulatory Visit | Attending: Adult Health | Admitting: Adult Health

## 2022-02-04 ENCOUNTER — Ambulatory Visit (INDEPENDENT_AMBULATORY_CARE_PROVIDER_SITE_OTHER): Payer: 59 | Admitting: Adult Health

## 2022-02-04 ENCOUNTER — Other Ambulatory Visit (HOSPITAL_COMMUNITY): Payer: Self-pay

## 2022-02-04 ENCOUNTER — Encounter: Payer: Self-pay | Admitting: Adult Health

## 2022-02-04 VITALS — BP 115/80 | HR 92 | Ht 65.25 in | Wt 253.5 lb

## 2022-02-04 DIAGNOSIS — Z01419 Encounter for gynecological examination (general) (routine) without abnormal findings: Secondary | ICD-10-CM

## 2022-02-04 DIAGNOSIS — R21 Rash and other nonspecific skin eruption: Secondary | ICD-10-CM | POA: Insufficient documentation

## 2022-02-04 MED ORDER — MUPIROCIN CALCIUM 2 % EX CREA
1.0000 | TOPICAL_CREAM | Freq: Two times a day (BID) | CUTANEOUS | 0 refills | Status: DC
  Filled 2022-02-04: qty 15, 8d supply, fill #0

## 2022-02-04 NOTE — Progress Notes (Signed)
Patient ID: CHRISTABELLA IYENGAR, female   DOB: 1996-07-17, 26 y.o.   MRN: [id number] History of Present Illness: Janyra is a 26 year old white female,with SO, G0P0 in for a well woman gyn exam and pap. She has itchy rash on legs for several weeks. She works in Pharmacist, community at [employer]. PCP is B Alona Bene NP.   Current Medications, Allergies, Past Medical History, Past Surgical History, Family History and Social History were reviewed in [employer] record.     Review of Systems: Patient denies any headaches, hearing loss, fatigue, blurred vision, shortness of breath, chest pain, abdominal pain, problems with bowel movements, urination, or intercourse. No joint pain or mood swings.  See HPI for positives.   Physical Exam:BP 115/80 (BP Location: Left Arm, Patient Position: Sitting, Cuff Size: Large)   Pulse 92   Ht 5' 5.25" (1.657 m)   Wt 253 lb 8 oz (115 kg)   LMP 01/24/2022   BMI 41.86 kg/m   General:  Well developed, well nourished, no acute distress Skin:  Warm and dry Neck:  Midline trachea, normal thyroid, good ROM, no lymphadenopathy Lungs; Clear to auscultation bilaterally Breast:  No dominant palpable mass, retraction, or nipple discharge Cardiovascular: Regular rate and rhythm Abdomen:  Soft, non tender, no hepatosplenomegaly Pelvic:  External genitalia is normal in appearance, no lesions.  The vagina is normal in appearance. Urethra has no lesions or masses. The cervix is nulliparous, pap with HR HPV genotyping performed.Marland Kitchen  Uterus is felt to be normal size, shape, and contour.  No adnexal masses or tenderness noted.Bladder is non tender, no masses felt. Extremities/musculoskeletal:  No swelling or varicosities noted, no clubbing or cyanosis, has rash on legs looks staph folliculitis  Psych:  No mood changes, alert and cooperative,seems happy AA is 2 Fall risk is low    02/04/2022    8:36 AM 12/24/2021    3:10 PM 06/21/2021    3:47 PM  Depression screen PHQ 2/9  Decreased  Interest 1 1 1   Down, Depressed, Hopeless 1 1 1   PHQ - 2 Score 2 2 2   Altered sleeping 2 0 0  Tired, decreased energy 2 2 2   Change in appetite 0 2 2  Feeling bad or failure about yourself  1 1 1   Trouble concentrating 0 1 0  Moving slowly or fidgety/restless 0 0 0  Suicidal thoughts 0 0 0  PHQ-9 Score 7 8 7   Difficult doing work/chores  Somewhat difficult Somewhat difficult   She has recently upped lexapro to 15 mg     02/04/2022    8:36 AM 12/24/2021    3:11 PM 06/21/2021    3:47 PM 05/21/2021   11:45 AM  GAD 7 : Generalized Anxiety Score  Nervous, Anxious, on Edge 2 2 1 1   Control/stop worrying 2 3 2 2   Worry too much - different things 2 2 2 2   Trouble relaxing 1 1 1 1   Restless 1 1 0 0  Easily annoyed or irritable 1 2 0 1  Afraid - awful might happen 2 2 2 2   Total GAD 7 Score 11 13 8 9   Anxiety Difficulty  Somewhat difficult Somewhat difficult Somewhat difficult      Upstream - 02/04/22 7829       Pregnancy Intention Screening   Does the patient want to become pregnant in the next year? Unsure    Does the patient's partner want to become pregnant in the next year? Unsure  Would the patient like to discuss contraceptive options today? Yes      Contraception Wrap Up   Current Method Withdrawal or Other Method    End Method Withdrawal or Other Method    Contraception Counseling Provided Yes             Examination chaperoned by Malachy Mood LPN  Impression and Plan: 1. Encounter for gynecological examination with Papanicolaou smear of cervix Pap sent Physical in 1 year Pap in 3 if normal Labs with PCP Take folic acid or PNV Discussed timing of sex, when decides to start trying  2. Skin rash -looks like staph folliculitis -will try Bactroban cream  Meds ordered this encounter  Medications   mupirocin cream (BACTROBAN) 2 %    Sig: Apply 1 Application topically 2 (two) times daily.    Dispense:  15 g    Refill:  0    Order Specific Question:    Supervising Provider    Answer:   Lazaro Arms [[id number]]   Get fresh razors, but do not shave right now

## 2022-02-05 ENCOUNTER — Ambulatory Visit (INDEPENDENT_AMBULATORY_CARE_PROVIDER_SITE_OTHER): Payer: 59 | Admitting: Family Medicine

## 2022-02-05 ENCOUNTER — Other Ambulatory Visit (HOSPITAL_COMMUNITY): Payer: Self-pay

## 2022-02-06 ENCOUNTER — Other Ambulatory Visit (HOSPITAL_COMMUNITY): Payer: Self-pay

## 2022-02-06 ENCOUNTER — Other Ambulatory Visit: Payer: Self-pay | Admitting: Adult Health

## 2022-02-06 LAB — CYTOLOGY - PAP
Comment: NEGATIVE
Diagnosis: NEGATIVE
High risk HPV: NEGATIVE

## 2022-02-06 MED ORDER — FLUCONAZOLE 150 MG PO TABS
ORAL_TABLET | ORAL | 1 refills | Status: DC
  Filled 2022-02-06: qty 2, 5d supply, fill #0

## 2022-02-12 ENCOUNTER — Other Ambulatory Visit (INDEPENDENT_AMBULATORY_CARE_PROVIDER_SITE_OTHER): Payer: Self-pay | Admitting: Family Medicine

## 2022-02-12 DIAGNOSIS — E559 Vitamin D deficiency, unspecified: Secondary | ICD-10-CM

## 2022-02-12 DIAGNOSIS — F3289 Other specified depressive episodes: Secondary | ICD-10-CM

## 2022-02-12 NOTE — Progress Notes (Signed)
TeleHealth Visit:  This visit was completed with telemedicine (audio/video) technology. Alexandra Jones has verbally consented to this TeleHealth visit. The patient is located at home, the provider is located at home. The participants in this visit include the listed provider and patient. The visit was conducted today via MyChart video.  OBESITY Alexandra Jones is here to discuss her progress with her obesity treatment plan along with follow-up of her obesity related diagnoses.   Today's visit was # 23 Starting weight: 250 lbs Starting date: 05/01/2021 Weight at last in office visit: 250 lbs on 01/07/22 Total weight loss: 0 lbs at last in office visit on 01/07/22. Today's reported weight:  No weight reported.  Nutrition Plan: keeping a food journal and adhering to recommended goals of 1300-1400 calories and 80+ gms protein about 25% of time. Hunger is poorly controlled. Cravings are poorly controlled.  Current exercise: none  Interim History: Alexandra Jones reports being off plan over the last few weeks.  She reports that she has not journaled much or made good food choices.  When she does journal she meets protein goal but exceeds calorie goal. She recently moved out of her parents house and into a house of her own which has affected her ability to stick to plan.  She tends to make healthy choices at breakfast and lunch.  Breakfast is often boiled eggs and cottage cheese or a yogurt and fruit or overnight oats made with fair life milk and [place] yogurt.  Lunch is usually a salad with 4 ounces of chicken or a wrap. She has been off of Wegovy for a few weeks-she was unable to tolerate the 2.4 mg dose due to nausea.  She notes increased hunger and cravings. She drinks only water.  Assessment/Plan:  1. Nausea Had nausea with 2.4 mg of Wegovy so she discontinued it.  Plan: New prescription sent for 0.5 mg of Wegovy.  2. Prediabetes Last A1c was 5.5. She admits to polyphagia. Medication(s): Metformin 500 mg  once daily. Lab Results  Component Value Date   HGBA1C 5.5 11/04/2021   Lab Results  Component Value Date   INSULIN 22.1 11/04/2021   INSULIN 14.5 06/24/2021   INSULIN 25.5 (H) 02/26/2021   INSULIN 14.7 10/01/2020    Plan: Continue metformin 500 mg once daily. New prescription sent for Wegovy 0.5 mg.  She is aware that it is likely she will not be able to obtain this dose of Wegovy currently. If unavailable she agrees to start Saxenda.   3. Obesity: Current BMI 41.6 Alexandra Jones is currently in the action stage of change. As such, her goal is to continue with weight loss efforts.  She has agreed to 1300-1400 calories and 80+ gms protein  Discussed using the lose it app to journal since it has a free barcode scanner.  Exercise goals: No exercise has been prescribed at this time.  Behavioral modification strategies: increasing lean protein intake, decreasing simple carbohydrates, meal planning and cooking strategies, planning for success, and keeping a strict food journal.  [name] has agreed to follow-up with our clinic in 2 weeks.   No orders of the defined types were placed in this encounter.   There are no discontinued medications.   Meds ordered this encounter  Medications   Semaglutide-Weight Management (WEGOVY) 0.5 MG/0.5ML SOAJ    Sig: Inject 0.5 mg into the skin once a week.    Dispense:  2 mL    Refill:  0    Order Specific Question:   Supervising Provider  Answer:   Dennard Nip D [[id number]]      Objective:   VITALS: Per patient if applicable, see vitals. GENERAL: Alert and in no acute distress. CARDIOPULMONARY: No increased WOB. Speaking in clear sentences.  PSYCH: Pleasant and cooperative. Speech normal rate and rhythm. Affect is appropriate. Insight and judgement are appropriate. Attention is focused, linear, and appropriate.  NEURO: Oriented as arrived to appointment on time with no prompting.   Lab Results  Component Value Date   CREATININE 0.79  12/24/2021   BUN 16 12/24/2021   NA 141 12/24/2021   K 4.2 12/24/2021   CL 100 12/24/2021   CO2 17 (L) 12/24/2021   Lab Results  Component Value Date   ALT 17 11/04/2021   AST 17 11/04/2021   ALKPHOS 73 11/04/2021   BILITOT <0.2 11/04/2021   Lab Results  Component Value Date   HGBA1C 5.5 11/04/2021   HGBA1C 5.6 06/24/2021   HGBA1C 5.5 02/26/2021   HGBA1C 5.8 (H) 10/01/2020   Lab Results  Component Value Date   INSULIN 22.1 11/04/2021   INSULIN 14.5 06/24/2021   INSULIN 25.5 (H) 02/26/2021   INSULIN 14.7 10/01/2020   Lab Results  Component Value Date   TSH 2.390 11/04/2021   Lab Results  Component Value Date   CHOL 161 11/04/2021   HDL 40 11/04/2021   LDLCALC 90 11/04/2021   TRIG 181 (H) 11/04/2021   CHOLHDL 5.1 (H) 03/18/2019   Lab Results  Component Value Date   WBC 5.0 01/28/2016   HGB 13.6 01/28/2016   HCT 39.8 01/28/2016   MCV 87.7 01/28/2016   PLT 132 (L) 01/28/2016   No results found for: "IRON", "TIBC", "FERRITIN" Lab Results  Component Value Date   VD25OH 37.1 11/04/2021   VD25OH 43.3 06/24/2021   VD25OH 38.8 02/26/2021    Attestation Statements:   Reviewed by clinician on day of visit: allergies, medications, problem list, medical history, surgical history, family history, social history, and previous encounter notes.

## 2022-02-13 ENCOUNTER — Other Ambulatory Visit (HOSPITAL_COMMUNITY): Payer: Self-pay

## 2022-02-14 ENCOUNTER — Other Ambulatory Visit (HOSPITAL_COMMUNITY): Payer: Self-pay

## 2022-02-17 ENCOUNTER — Other Ambulatory Visit (HOSPITAL_COMMUNITY): Payer: Self-pay

## 2022-02-17 ENCOUNTER — Encounter (INDEPENDENT_AMBULATORY_CARE_PROVIDER_SITE_OTHER): Payer: Self-pay | Admitting: Family Medicine

## 2022-02-17 ENCOUNTER — Telehealth (INDEPENDENT_AMBULATORY_CARE_PROVIDER_SITE_OTHER): Payer: 59 | Admitting: Family Medicine

## 2022-02-17 DIAGNOSIS — Z7984 Long term (current) use of oral hypoglycemic drugs: Secondary | ICD-10-CM

## 2022-02-17 DIAGNOSIS — E669 Obesity, unspecified: Secondary | ICD-10-CM | POA: Diagnosis not present

## 2022-02-17 DIAGNOSIS — Z6841 Body Mass Index (BMI) 40.0 and over, adult: Secondary | ICD-10-CM

## 2022-02-17 DIAGNOSIS — R110 Nausea: Secondary | ICD-10-CM | POA: Diagnosis not present

## 2022-02-17 DIAGNOSIS — R7303 Prediabetes: Secondary | ICD-10-CM

## 2022-02-17 MED ORDER — WEGOVY 0.5 MG/0.5ML ~~LOC~~ SOAJ
0.5000 mg | SUBCUTANEOUS | 0 refills | Status: DC
  Filled 2022-02-17: qty 2, 28d supply, fill #0

## 2022-02-18 ENCOUNTER — Other Ambulatory Visit (INDEPENDENT_AMBULATORY_CARE_PROVIDER_SITE_OTHER): Payer: Self-pay | Admitting: Family Medicine

## 2022-02-18 DIAGNOSIS — F3289 Other specified depressive episodes: Secondary | ICD-10-CM

## 2022-02-19 ENCOUNTER — Telehealth (INDEPENDENT_AMBULATORY_CARE_PROVIDER_SITE_OTHER): Payer: Self-pay

## 2022-02-19 ENCOUNTER — Other Ambulatory Visit (HOSPITAL_COMMUNITY): Payer: Self-pay

## 2022-02-19 NOTE — Telephone Encounter (Signed)
Patient was seen on July 5th by [facility] and her: escitalopram (LEXAPRO) 10 MG tablet escitalopram (LEXAPRO) 5 MG tablet  was not refilled patient would like it sent to :  [facility] Phone:  [phone]  Fax:  [phone]

## 2022-02-20 ENCOUNTER — Encounter (INDEPENDENT_AMBULATORY_CARE_PROVIDER_SITE_OTHER): Payer: Self-pay | Admitting: Family Medicine

## 2022-02-20 ENCOUNTER — Other Ambulatory Visit (HOSPITAL_COMMUNITY): Payer: Self-pay

## 2022-02-20 ENCOUNTER — Other Ambulatory Visit (INDEPENDENT_AMBULATORY_CARE_PROVIDER_SITE_OTHER): Payer: Self-pay | Admitting: Family Medicine

## 2022-02-20 DIAGNOSIS — F3289 Other specified depressive episodes: Secondary | ICD-10-CM

## 2022-02-20 MED ORDER — ESCITALOPRAM OXALATE 10 MG PO TABS
10.0000 mg | ORAL_TABLET | Freq: Every day | ORAL | 0 refills | Status: DC
  Filled 2022-02-20: qty 30, 30d supply, fill #0

## 2022-02-20 MED ORDER — ESCITALOPRAM OXALATE 5 MG PO TABS
5.0000 mg | ORAL_TABLET | Freq: Every day | ORAL | 0 refills | Status: DC
  Filled 2022-02-20: qty 30, 30d supply, fill #0

## 2022-02-20 NOTE — Telephone Encounter (Signed)
Pt states her refill was not sent in at last OV.

## 2022-03-05 ENCOUNTER — Ambulatory Visit (INDEPENDENT_AMBULATORY_CARE_PROVIDER_SITE_OTHER): Payer: 59 | Admitting: Family Medicine

## 2022-03-13 ENCOUNTER — Other Ambulatory Visit (HOSPITAL_COMMUNITY): Payer: Self-pay

## 2022-03-13 ENCOUNTER — Ambulatory Visit (INDEPENDENT_AMBULATORY_CARE_PROVIDER_SITE_OTHER): Payer: 59 | Admitting: Family Medicine

## 2022-03-13 ENCOUNTER — Encounter (INDEPENDENT_AMBULATORY_CARE_PROVIDER_SITE_OTHER): Payer: Self-pay | Admitting: Family Medicine

## 2022-03-13 VITALS — BP 132/75 | HR 67 | Ht 65.0 in | Wt 251.4 lb

## 2022-03-13 DIAGNOSIS — F3289 Other specified depressive episodes: Secondary | ICD-10-CM | POA: Diagnosis not present

## 2022-03-13 DIAGNOSIS — E66813 Obesity, class 3: Secondary | ICD-10-CM

## 2022-03-13 DIAGNOSIS — R7303 Prediabetes: Secondary | ICD-10-CM

## 2022-03-13 DIAGNOSIS — E669 Obesity, unspecified: Secondary | ICD-10-CM

## 2022-03-13 DIAGNOSIS — Z6841 Body Mass Index (BMI) 40.0 and over, adult: Secondary | ICD-10-CM | POA: Diagnosis not present

## 2022-03-13 DIAGNOSIS — E559 Vitamin D deficiency, unspecified: Secondary | ICD-10-CM

## 2022-03-13 MED ORDER — METFORMIN HCL 500 MG PO TABS
500.0000 mg | ORAL_TABLET | Freq: Every day | ORAL | 1 refills | Status: DC
  Filled 2022-03-13: qty 30, 30d supply, fill #0

## 2022-03-13 MED ORDER — ESCITALOPRAM OXALATE 10 MG PO TABS
10.0000 mg | ORAL_TABLET | Freq: Every day | ORAL | 0 refills | Status: DC
  Filled 2022-03-13: qty 30, 30d supply, fill #0

## 2022-03-13 MED ORDER — VITAMIN D (ERGOCALCIFEROL) 1.25 MG (50000 UNIT) PO CAPS
50000.0000 [IU] | ORAL_CAPSULE | ORAL | 0 refills | Status: DC
  Filled 2022-03-13: qty 4, 28d supply, fill #0

## 2022-03-13 MED ORDER — METFORMIN HCL 500 MG PO TABS
500.0000 mg | ORAL_TABLET | Freq: Every day | ORAL | 1 refills | Status: DC
  Filled 2022-03-15: qty 30, 30d supply, fill #0

## 2022-03-13 MED ORDER — ESCITALOPRAM OXALATE 5 MG PO TABS
5.0000 mg | ORAL_TABLET | Freq: Every day | ORAL | 0 refills | Status: DC
  Filled 2022-03-13: qty 30, 30d supply, fill #0

## 2022-03-13 MED ORDER — VITAMIN D (ERGOCALCIFEROL) 1.25 MG (50000 UNIT) PO CAPS
50000.0000 [IU] | ORAL_CAPSULE | ORAL | 0 refills | Status: DC
  Filled 2022-03-15: qty 4, 28d supply, fill #0

## 2022-03-13 MED ORDER — WEGOVY 1 MG/0.5ML ~~LOC~~ SOAJ
1.0000 mg | SUBCUTANEOUS | 0 refills | Status: DC
  Filled 2022-03-13: qty 2, 28d supply, fill #0

## 2022-03-16 ENCOUNTER — Other Ambulatory Visit (HOSPITAL_COMMUNITY): Payer: Self-pay

## 2022-03-17 ENCOUNTER — Other Ambulatory Visit (HOSPITAL_COMMUNITY): Payer: Self-pay

## 2022-03-19 ENCOUNTER — Other Ambulatory Visit (HOSPITAL_COMMUNITY): Payer: Self-pay

## 2022-03-19 ENCOUNTER — Encounter (INDEPENDENT_AMBULATORY_CARE_PROVIDER_SITE_OTHER): Payer: Self-pay

## 2022-03-23 NOTE — Progress Notes (Unsigned)
Chief Complaint:   OBESITY Alexandra Jones is here to discuss her progress with her obesity treatment plan along with follow-up of her obesity related diagnoses. Alexandra Jones is on keeping a food journal and adhering to recommended goals of 1300-1400 calories and 80+ grams of protein daily and states she is following her eating plan approximately 60% of the time. Alexandra Jones states she is doing 0 minutes 0 times per week.  Today's visit was #: 24 Starting weight: 250 lbs Starting date: 05/01/2021 Today's weight: 251 lbs Today's date: 03/13/2022 Total lbs lost to date: 0 Total lbs lost since last in-office visit: 0  Interim History: Alexandra Jones continues to be mindful of her food choices.  She notes no decrease in hunger on her current Wegovy dose.  No side effects were noted.  She is struggling with dinner choices, and she is eating out and meal planning has been difficult..  Subjective:   1. Vitamin D deficiency Alexandra Jones is stable on vitamin D, with no side effects noted.  2. Pre-diabetes Alexandra Jones on metformin and GLP-1.  She is working on her diet, but she is still struggling with polyphagia.  3. Other depression with emotional eating Alexandra Jones is stable on her current Lexapro dose.  She has side effects with her higher dose.  Assessment/Plan:   1. Vitamin D deficiency We will refill prescription Vitamin D for 1 month. Alexandra Jones will follow-up for routine testing of Vitamin D, at least 2-3 times per year to avoid over-replacement.  - Vitamin D, Ergocalciferol, (DRISDOL) 1.25 MG (50000 UNIT) CAPS capsule; Take 1 capsule by mouth every 7 days.  Dispense: 4 capsule; Refill: 0  2. Pre-diabetes We will refill metformin for 2 months. Alexandra Jones will continue to work on weight loss, exercise, and decreasing simple carbohydrates to help decrease the risk of diabetes.   - metFORMIN (GLUCOPHAGE) 500 MG tablet; Take 1 tablet by mouth daily with breakfast.  Dispense: 30 tablet; Refill: 1  3. Other depression with emotional  eating We will refill Lexapro 10 mg once daily and Lexapro 5 mg once daily for 1 month.  - escitalopram (LEXAPRO) 10 MG tablet; Take 1 tablet (10 mg total) by mouth daily.  Dispense: 30 tablet; Refill: 0 - escitalopram (LEXAPRO) 5 MG tablet; Take 1 tablet (5 mg total) by mouth daily.  Dispense: 30 tablet; Refill: 0  4. Obesity current BMI 41.6 Alexandra Jones is currently in the action stage of change. As such, her goal is to continue with weight loss efforts. She has agreed to keeping a food journal and adhering to recommended goals of 1300-1400 calories and 80+ grams of protein daily.   We discussed various medication options to help Alexandra Jones with her weight loss efforts and we both agreed to increase Wegovy to 1 mg once weekly, with no refills.  - Semaglutide-Weight Management (WEGOVY) 1 MG/0.5ML SOAJ; Inject 1 mg into the skin once a week.  Dispense: 2 mL; Refill: 0  Behavioral modification strategies: increasing lean protein intake, decreasing eating out, meal planning and cooking strategies, and emotional eating strategies.  Alexandra Jones has agreed to follow-up with our clinic in 3 weeks. She was informed of the importance of frequent follow-up visits to maximize her success with intensive lifestyle modifications for her multiple health conditions.   Objective:   Blood pressure 132/75, pulse 67, height '5\' 5"'$  (1.651 m), weight 251 lb 6.4 oz (114 kg), SpO2 97 %. Body mass index is 41.84 kg/m.  General: Cooperative, alert, well developed, in no acute distress. HEENT: Conjunctivae  and lids unremarkable. Cardiovascular: Regular rhythm.  Lungs: Normal work of breathing. Neurologic: No focal deficits.   Lab Results  Component Value Date   CREATININE 0.79 12/24/2021   BUN 16 12/24/2021   NA 141 12/24/2021   K 4.2 12/24/2021   CL 100 12/24/2021   CO2 17 (L) 12/24/2021   Lab Results  Component Value Date   ALT 17 11/04/2021   AST 17 11/04/2021   ALKPHOS 73 11/04/2021   BILITOT <0.2 11/04/2021    Lab Results  Component Value Date   HGBA1C 5.5 11/04/2021   HGBA1C 5.6 06/24/2021   HGBA1C 5.5 02/26/2021   HGBA1C 5.8 (H) 10/01/2020   Lab Results  Component Value Date   INSULIN 22.1 11/04/2021   INSULIN 14.5 06/24/2021   INSULIN 25.5 (H) 02/26/2021   INSULIN 14.7 10/01/2020   Lab Results  Component Value Date   TSH 2.390 11/04/2021   Lab Results  Component Value Date   CHOL 161 11/04/2021   HDL 40 11/04/2021   LDLCALC 90 11/04/2021   TRIG 181 (H) 11/04/2021   CHOLHDL 5.1 (H) 03/18/2019   Lab Results  Component Value Date   VD25OH 37.1 11/04/2021   VD25OH 43.3 06/24/2021   VD25OH 38.8 02/26/2021   Lab Results  Component Value Date   WBC 5.0 01/28/2016   HGB 13.6 01/28/2016   HCT 39.8 01/28/2016   MCV 87.7 01/28/2016   PLT 132 (L) 01/28/2016   No results found for: "IRON", "TIBC", "FERRITIN"  Attestation Statements:   Reviewed by clinician on day of visit: allergies, medications, problem list, medical history, surgical history, family history, social history, and previous encounter notes.   I, Trixie Dredge, am acting as transcriptionist for Dennard Nip, MD.  I have reviewed the above documentation for accuracy and completeness, and I agree with the above. -  Dennard Nip, MD

## 2022-03-27 ENCOUNTER — Other Ambulatory Visit (HOSPITAL_COMMUNITY): Payer: Self-pay

## 2022-03-27 ENCOUNTER — Other Ambulatory Visit: Payer: Self-pay | Admitting: Family Medicine

## 2022-03-27 DIAGNOSIS — I10 Essential (primary) hypertension: Secondary | ICD-10-CM

## 2022-03-27 MED ORDER — AMLODIPINE BESYLATE 10 MG PO TABS
10.0000 mg | ORAL_TABLET | Freq: Every day | ORAL | 0 refills | Status: DC
  Filled 2022-03-27: qty 90, 90d supply, fill #0

## 2022-04-02 NOTE — Progress Notes (Signed)
TeleHealth Visit:  This visit was completed with telemedicine (audio/video) technology. Alexandra Jones has verbally consented to this TeleHealth visit. The patient is located at home, the provider is located at home. The participants in this visit include the listed provider and patient. The visit was conducted today via MyChart video.  OBESITY Alexandra Jones is here to discuss her progress with her obesity treatment plan along with follow-up of her obesity related diagnoses.   Today's visit was # 25 Starting weight: 250 lbs Starting date: 05/01/2021 Weight at last in office visit: 251 lbs on 03/13/22 Total weight loss: 0 lbs at last in office visit on 03/13/22. Today's reported weight: No weight reported.  Nutrition Plan: keeping a food journal and adhering to recommended goals of 1300-1400 calories and 80+ protein.  Hunger is moderately controlled. Cravings are moderately controlled.  Current exercise: none  Interim History: Alexandra Jones journals about 75% of the time and meets her protein goal but exceeds calorie goal.  She tends to eat fast food after work.  Reports motivation to make good choices is an issue.  Hunger and cravings are not currently well controlled.  Tolerating Wegovy well at 1 mg-no nausea or constipation.  Had nausea at the 2.4 mg dose in the past.  Reports water intake could be better.  Assessment/Plan:  1.  Other depression/emotional eating Alexandra Jones is taking 15 mg of Lexapro for depression and anxiety.  She reports these issues are well controlled. She does note food cravings. Medication(s): Lexapro 15 mg daily.  Plan: Refill Lexapro 5 mg daily Refill Lexapro 10 mg daily Continue total dose of 15 mg daily.  2. Vitamin D Deficiency Vitamin D is not at goal of 50.  Last vitamin D level low at 37.1 on 11/04/2021. She is on weekly prescription Vitamin D 50,000 IU.  Lab Results  Component Value Date   VD25OH 37.1 11/04/2021   VD25OH 43.3 06/24/2021   VD25OH 38.8 02/26/2021     Plan: Refill prescription vitamin D 50,000 IU weekly.   3. Obesity: Current BMI 41.8 Increase dose of Wegovy to 1.7 mg. Refill Wegovy 1.7 mg weekly. Alexandra Jones is currently in the action stage of change. As such, her goal is to continue with weight loss efforts.  She has agreed to keeping a food journal and adhering to recommended goals of 1300-1400 calories and 80+ protein.  Discussed strategy of having 1 off plan dinner per week.  The rest of the meals that she picks up need to be from the dining out guide. She will work on making better choices with her sides-salad or fruit rather than [place] fries.   Exercise goals: All adults should avoid inactivity. Some physical activity is better than none, and adults who participate in any amount of physical activity gain some health benefits.  Behavioral modification strategies: increasing lean protein intake, decreasing simple carbohydrates, decreasing eating out, and meal planning and cooking strategies.  Alexandra Jones has agreed to follow-up with our clinic in 4 weeks.   No orders of the defined types were placed in this encounter.   Medications Discontinued During This Encounter  Medication Reason   Semaglutide-Weight Management (WEGOVY) 1 MG/0.5ML SOAJ Dose change   Vitamin D, Ergocalciferol, (DRISDOL) 1.25 MG (50000 UNIT) CAPS capsule Reorder   escitalopram (LEXAPRO) 10 MG tablet Reorder   escitalopram (LEXAPRO) 5 MG tablet Reorder     Meds ordered this encounter  Medications   Semaglutide-Weight Management (WEGOVY) 1.7 MG/0.75ML SOAJ    Sig: Inject 1 pen (1.7 mg) into the skin  once a week.    Dispense:  3 mL    Refill:  0    Order Specific Question:   Supervising Provider    Answer:   Quillian Quince D [[id number]]   Vitamin D, Ergocalciferol, (DRISDOL) 1.25 MG (50000 UNIT) CAPS capsule    Sig: Take 1 capsule by mouth every 7 days.    Dispense:  4 capsule    Refill:  0    Order Specific Question:   Supervising Provider    Answer:    Quillian Quince D [[id number]]   escitalopram (LEXAPRO) 10 MG tablet    Sig: Take 1 tablet by mouth daily.    Dispense:  30 tablet    Refill:  0    Order Specific Question:   Supervising Provider    Answer:   Quillian Quince D [[id number]]   escitalopram (LEXAPRO) 5 MG tablet    Sig: Take 1 tablet  by mouth daily.    Dispense:  30 tablet    Refill:  0    Order Specific Question:   Supervising Provider    Answer:   Quillian Quince D [[id number]]      Objective:   VITALS: Per patient if applicable, see vitals. GENERAL: Alert and in no acute distress. CARDIOPULMONARY: No increased WOB. Speaking in clear sentences.  PSYCH: Pleasant and cooperative. Speech normal rate and rhythm. Affect is appropriate. Insight and judgement are appropriate. Attention is focused, linear, and appropriate.  NEURO: Oriented as arrived to appointment on time with no prompting.   Lab Results  Component Value Date   CREATININE 0.79 12/24/2021   BUN 16 12/24/2021   NA 141 12/24/2021   K 4.2 12/24/2021   CL 100 12/24/2021   CO2 17 (L) 12/24/2021   Lab Results  Component Value Date   ALT 17 11/04/2021   AST 17 11/04/2021   ALKPHOS 73 11/04/2021   BILITOT <0.2 11/04/2021   Lab Results  Component Value Date   HGBA1C 5.5 11/04/2021   HGBA1C 5.6 06/24/2021   HGBA1C 5.5 02/26/2021   HGBA1C 5.8 (H) 10/01/2020   Lab Results  Component Value Date   INSULIN 22.1 11/04/2021   INSULIN 14.5 06/24/2021   INSULIN 25.5 (H) 02/26/2021   INSULIN 14.7 10/01/2020   Lab Results  Component Value Date   TSH 2.390 11/04/2021   Lab Results  Component Value Date   CHOL 161 11/04/2021   HDL 40 11/04/2021   LDLCALC 90 11/04/2021   TRIG 181 (H) 11/04/2021   CHOLHDL 5.1 (H) 03/18/2019   Lab Results  Component Value Date   WBC 5.0 01/28/2016   HGB 13.6 01/28/2016   HCT 39.8 01/28/2016   MCV 87.7 01/28/2016   PLT 132 (L) 01/28/2016   No results found for: "IRON", "TIBC", "FERRITIN" Lab Results  Component Value Date    VD25OH 37.1 11/04/2021   VD25OH 43.3 06/24/2021   VD25OH 38.8 02/26/2021    Attestation Statements:   Reviewed by clinician on day of visit: allergies, medications, problem list, medical history, surgical history, family history, social history, and previous encounter notes.

## 2022-04-03 ENCOUNTER — Encounter (INDEPENDENT_AMBULATORY_CARE_PROVIDER_SITE_OTHER): Payer: Self-pay | Admitting: Family Medicine

## 2022-04-03 ENCOUNTER — Telehealth (INDEPENDENT_AMBULATORY_CARE_PROVIDER_SITE_OTHER): Payer: 59 | Admitting: Family Medicine

## 2022-04-03 ENCOUNTER — Other Ambulatory Visit (HOSPITAL_COMMUNITY): Payer: Self-pay

## 2022-04-03 DIAGNOSIS — Z7985 Long-term (current) use of injectable non-insulin antidiabetic drugs: Secondary | ICD-10-CM | POA: Diagnosis not present

## 2022-04-03 DIAGNOSIS — F3289 Other specified depressive episodes: Secondary | ICD-10-CM

## 2022-04-03 DIAGNOSIS — E559 Vitamin D deficiency, unspecified: Secondary | ICD-10-CM | POA: Diagnosis not present

## 2022-04-03 DIAGNOSIS — Z6841 Body Mass Index (BMI) 40.0 and over, adult: Secondary | ICD-10-CM

## 2022-04-03 DIAGNOSIS — E669 Obesity, unspecified: Secondary | ICD-10-CM

## 2022-04-03 MED ORDER — VITAMIN D (ERGOCALCIFEROL) 1.25 MG (50000 UNIT) PO CAPS
50000.0000 [IU] | ORAL_CAPSULE | ORAL | 0 refills | Status: DC
  Filled 2022-04-03: qty 4, 28d supply, fill #0

## 2022-04-03 MED ORDER — ESCITALOPRAM OXALATE 5 MG PO TABS
5.0000 mg | ORAL_TABLET | Freq: Every day | ORAL | 0 refills | Status: DC
  Filled 2022-04-03: qty 30, 30d supply, fill #0

## 2022-04-03 MED ORDER — ESCITALOPRAM OXALATE 10 MG PO TABS
10.0000 mg | ORAL_TABLET | Freq: Every day | ORAL | 0 refills | Status: DC
  Filled 2022-04-03: qty 30, 30d supply, fill #0

## 2022-04-03 MED ORDER — WEGOVY 1.7 MG/0.75ML ~~LOC~~ SOAJ
1.7000 mg | SUBCUTANEOUS | 0 refills | Status: DC
  Filled 2022-04-03: qty 3, 28d supply, fill #0

## 2022-04-22 ENCOUNTER — Ambulatory Visit (INDEPENDENT_AMBULATORY_CARE_PROVIDER_SITE_OTHER): Payer: 59 | Admitting: Family Medicine

## 2022-04-28 ENCOUNTER — Other Ambulatory Visit (HOSPITAL_COMMUNITY): Payer: Self-pay

## 2022-04-28 ENCOUNTER — Ambulatory Visit (INDEPENDENT_AMBULATORY_CARE_PROVIDER_SITE_OTHER): Payer: 59 | Admitting: Family Medicine

## 2022-04-28 ENCOUNTER — Encounter (INDEPENDENT_AMBULATORY_CARE_PROVIDER_SITE_OTHER): Payer: Self-pay | Admitting: Family Medicine

## 2022-04-28 ENCOUNTER — Telehealth (INDEPENDENT_AMBULATORY_CARE_PROVIDER_SITE_OTHER): Payer: 59 | Admitting: Family Medicine

## 2022-04-28 DIAGNOSIS — R5383 Other fatigue: Secondary | ICD-10-CM | POA: Diagnosis not present

## 2022-04-28 DIAGNOSIS — E669 Obesity, unspecified: Secondary | ICD-10-CM

## 2022-04-28 DIAGNOSIS — F3289 Other specified depressive episodes: Secondary | ICD-10-CM | POA: Diagnosis not present

## 2022-04-28 DIAGNOSIS — Z6841 Body Mass Index (BMI) 40.0 and over, adult: Secondary | ICD-10-CM | POA: Diagnosis not present

## 2022-04-28 DIAGNOSIS — K219 Gastro-esophageal reflux disease without esophagitis: Secondary | ICD-10-CM

## 2022-04-28 DIAGNOSIS — E559 Vitamin D deficiency, unspecified: Secondary | ICD-10-CM | POA: Diagnosis not present

## 2022-04-28 MED ORDER — VITAMIN D (ERGOCALCIFEROL) 1.25 MG (50000 UNIT) PO CAPS
50000.0000 [IU] | ORAL_CAPSULE | ORAL | 0 refills | Status: DC
  Filled 2022-04-28: qty 4, 28d supply, fill #0

## 2022-04-28 MED ORDER — WEGOVY 2.4 MG/0.75ML ~~LOC~~ SOAJ
2.4000 mg | SUBCUTANEOUS | 0 refills | Status: DC
  Filled 2022-04-28: qty 3, 28d supply, fill #0

## 2022-04-28 MED ORDER — ESCITALOPRAM OXALATE 10 MG PO TABS
10.0000 mg | ORAL_TABLET | Freq: Every day | ORAL | 0 refills | Status: DC
  Filled 2022-04-28: qty 30, 30d supply, fill #0

## 2022-04-28 MED ORDER — ESCITALOPRAM OXALATE 5 MG PO TABS
5.0000 mg | ORAL_TABLET | Freq: Every day | ORAL | 0 refills | Status: DC
  Filled 2022-04-28: qty 30, 30d supply, fill #0

## 2022-04-28 NOTE — Progress Notes (Signed)
Patient was checked in for visit even though she was late. Visit changed to My Chart visit.

## 2022-04-28 NOTE — Progress Notes (Signed)
TeleHealth Visit:  This visit was completed with telemedicine (audio/video) technology. Alexandra Jones has verbally consented to this TeleHealth visit. The patient is located at home, the provider is located at home. The participants in this visit include the listed provider and patient. The visit was conducted today via MyChart video.  OBESITY Alexandra Jones is here to discuss her progress with her obesity treatment plan along with follow-up of her obesity related diagnoses.   Today's visit was # 26 Starting weight: 250 lbs Starting date: 05/01/2021 Weight at last in office visit: 251 lbs on 03/13/22 Total weight loss: 0 lbs at last in office visit on 03/13/22. Today's reported weight: No weight reported.  Nutrition Plan: keeping a food journal and adhering to recommended goals of 1300-1400 calories and 80 gms protein.   Current exercise: none  Interim History: Alexandra Jones went on a camping trip about a week ago and has not yet gotten back on plan.  She tends to eat out quite a bit at dinner but has worked on decreasing this.  She is also trying to make a better choice with side dishes.  She is getting less combos when eating out. Hunger is mostly controlled but cravings are not. Currently on Wegovy 1.7 mg weekly.  Assessment/Plan:  1. Vitamin D Deficiency Vitamin D is not at goal of 50.  Last vitamin D was 37.1 on 11/04/2021. She is on weekly prescription Vitamin D 50,000 IU.  Lab Results  Component Value Date   VD25OH 37.1 11/04/2021   VD25OH 43.3 06/24/2021   VD25OH 38.8 02/26/2021    Plan: Refill prescription vitamin D 50,000 IU weekly.   2.  Other depression/emotional eating Alexandra Jones has had issues with stress/emotional eating. Currently this is poorly controlled. Overall mood is stable.  Medication(s): Lexapro 15 mg daily.  Plan: Refill escitalopram (LEXAPRO) 10 MG tablet   Sig: Take 1 tablet by mouth daily.   Dispense:  30 tablet   Refill:  0   Order Specific Question:   Supervising  Provider   Answer:   Dennard Nip D [[id number]]  escitalopram (LEXAPRO) 5 MG tablet   Sig: Take 1 tablet  by mouth daily.   Dispense:  30 tablet   Refill:  0   Order Specific Question:   Supervising Provider   Answer:   Dennard Nip D [[id number]]    3.  Other fatigue She notes worsening fatigue over the past few months.  She reports sleep amount has not changed-getting 7 to 8 hours per night.  She reports that she snores. Last hemoglobin I have access to was 13.6 in June 2017.  She denies heavy periods. TSH normal in March 2023. Vitamin D level is slightly low.  Plan: Has upcoming appointment with PCP and will discuss this issue with her. Check labs next office visit. Consider sleep study at next office visit.  4. GERD She is having GERD symptoms in the morning.  She is taking over-the-counter famotidine and Tums sporadically.  She asked if this could be related to [facility] and I told her this is very likely.  Plan: Take over-the-counter famotidine every night before bed. Chew 2 Tums at bedtime. Try not to eat within 3 hours of bedtime.  5. Obesity: Current BMI 41.8 Increase dose of Wegovy to 2.4 mg weekly and refill.  Alexandra Jones is currently in the action stage of change. As such, her goal is to continue with weight loss efforts.  She has agreed to keeping a food journal and adhering to recommended goals of  1300-1400 calories and 80 gms protein.   Exercise goals: Walk for 30 minutes 3 times per week.  Behavioral modification strategies: increasing lean protein intake, decreasing simple carbohydrates, decreasing eating out, and meal planning and cooking strategies.  Alexandra Jones has agreed to follow-up with our clinic in 4 weeks (MyChart visit with me) and an in office visit with Dr. Leafy Ro in 8 weeks with fasting labs.  No orders of the defined types were placed in this encounter.   Medications Discontinued During This Encounter  Medication Reason   Semaglutide-Weight Management  (WEGOVY) 1.7 MG/0.75ML SOAJ Dose change   Vitamin D, Ergocalciferol, (DRISDOL) 1.25 MG (50000 UNIT) CAPS capsule Reorder   escitalopram (LEXAPRO) 10 MG tablet Reorder   escitalopram (LEXAPRO) 5 MG tablet Reorder     Meds ordered this encounter  Medications   Semaglutide-Weight Management (WEGOVY) 2.4 MG/0.75ML SOAJ    Sig: Inject 2.4 mg into the skin once a week.    Dispense:  3 mL    Refill:  0    Order Specific Question:   Supervising Provider    Answer:   Dennard Nip D [[id number]]   Vitamin D, Ergocalciferol, (DRISDOL) 1.25 MG (50000 UNIT) CAPS capsule    Sig: Take 1 capsule by mouth every 7 days.    Dispense:  4 capsule    Refill:  0    Order Specific Question:   Supervising Provider    Answer:   Dennard Nip D [[id number]]   escitalopram (LEXAPRO) 10 MG tablet    Sig: Take 1 tablet by mouth daily.    Dispense:  30 tablet    Refill:  0    Order Specific Question:   Supervising Provider    Answer:   Dennard Nip D [[id number]]   escitalopram (LEXAPRO) 5 MG tablet    Sig: Take 1 tablet  by mouth daily.    Dispense:  30 tablet    Refill:  0    Order Specific Question:   Supervising Provider    Answer:   Dennard Nip D [[id number]]      Objective:   VITALS: Per patient if applicable, see vitals. GENERAL: Alert and in no acute distress. CARDIOPULMONARY: No increased WOB. Speaking in clear sentences.  PSYCH: Pleasant and cooperative. Speech normal rate and rhythm. Affect is appropriate. Insight and judgement are appropriate. Attention is focused, linear, and appropriate.  NEURO: Oriented as arrived to appointment on time with no prompting.   Lab Results  Component Value Date   CREATININE 0.79 12/24/2021   BUN 16 12/24/2021   NA 141 12/24/2021   K 4.2 12/24/2021   CL 100 12/24/2021   CO2 17 (L) 12/24/2021   Lab Results  Component Value Date   ALT 17 11/04/2021   AST 17 11/04/2021   ALKPHOS 73 11/04/2021   BILITOT <0.2 11/04/2021   Lab Results  Component Value Date    HGBA1C 5.5 11/04/2021   HGBA1C 5.6 06/24/2021   HGBA1C 5.5 02/26/2021   HGBA1C 5.8 (H) 10/01/2020   Lab Results  Component Value Date   INSULIN 22.1 11/04/2021   INSULIN 14.5 06/24/2021   INSULIN 25.5 (H) 02/26/2021   INSULIN 14.7 10/01/2020   Lab Results  Component Value Date   TSH 2.390 11/04/2021   Lab Results  Component Value Date   CHOL 161 11/04/2021   HDL 40 11/04/2021   LDLCALC 90 11/04/2021   TRIG 181 (H) 11/04/2021   CHOLHDL 5.1 (H) 03/18/2019   Lab Results  Component Value Date  WBC 5.0 01/28/2016   HGB 13.6 01/28/2016   HCT 39.8 01/28/2016   MCV 87.7 01/28/2016   PLT 132 (L) 01/28/2016   No results found for: "IRON", "TIBC", "FERRITIN" Lab Results  Component Value Date   VD25OH 37.1 11/04/2021   VD25OH 43.3 06/24/2021   VD25OH 38.8 02/26/2021    Attestation Statements:   Reviewed by clinician on day of visit: allergies, medications, problem list, medical history, surgical history, family history, social history, and previous encounter notes.

## 2022-05-13 ENCOUNTER — Other Ambulatory Visit (HOSPITAL_COMMUNITY): Payer: Self-pay

## 2022-05-13 ENCOUNTER — Encounter: Payer: Self-pay | Admitting: Family Medicine

## 2022-05-13 ENCOUNTER — Ambulatory Visit: Payer: 59 | Admitting: Family Medicine

## 2022-05-13 VITALS — BP 122/64 | HR 86 | Temp 97.9°F | Ht 65.0 in | Wt 252.2 lb

## 2022-05-13 DIAGNOSIS — F411 Generalized anxiety disorder: Secondary | ICD-10-CM

## 2022-05-13 DIAGNOSIS — I10 Essential (primary) hypertension: Secondary | ICD-10-CM | POA: Diagnosis not present

## 2022-05-13 DIAGNOSIS — Z23 Encounter for immunization: Secondary | ICD-10-CM

## 2022-05-13 DIAGNOSIS — E782 Mixed hyperlipidemia: Secondary | ICD-10-CM | POA: Insufficient documentation

## 2022-05-13 DIAGNOSIS — Z6841 Body Mass Index (BMI) 40.0 and over, adult: Secondary | ICD-10-CM

## 2022-05-13 DIAGNOSIS — E66813 Obesity, class 3: Secondary | ICD-10-CM

## 2022-05-13 MED ORDER — AMLODIPINE BESYLATE 10 MG PO TABS
10.0000 mg | ORAL_TABLET | Freq: Every day | ORAL | 3 refills | Status: DC
  Filled 2022-05-13 – 2022-06-21 (×2): qty 90, 90d supply, fill #0
  Filled 2022-09-16: qty 90, 90d supply, fill #1
  Filled 2023-01-05: qty 90, 90d supply, fill #2
  Filled 2023-04-05: qty 90, 90d supply, fill #3

## 2022-05-13 NOTE — Progress Notes (Signed)
Subjective:  Patient ID: Alexandra Jones, female    DOB: 1995/08/19  Age: 26 y.o. MRN: [id number]  CC: Chief Complaint  Patient presents with   Establish Care    Pt arrives to est care with provider. Former pt of [facility].     HPI:  26 year old female presents to establish care.  Patient suffers from obesity.  She is following with healthy weight and wellness.  She is on max dose of Wegovy.  She continues to struggle with her weight.  Patient's hypertension is well controlled on amlodipine.  Patient does state that she needs a refill.  Patient's anxiety is stable on Lexapro.  She is currently taking 15 mg daily.  Patient's preventative healthcare items have recently been addressed.  She does need a flu vaccine today.  Advised that she may get the new COVID-vaccine if she desires.  Patient Active Problem List   Diagnosis Date Noted   Essential hypertension 05/13/2022   Mixed hyperlipidemia 05/13/2022   Vitamin D deficiency 03/13/2022   Class 3 severe obesity due to excess calories with serious comorbidity and body mass index (BMI) of 40.0 to 44.9 in adult [facility]) 10/01/2020   GERD (gastroesophageal reflux disease)    Anxiety disorder 03/14/2019   IUD (intrauterine device) in place 01/13/2018    Social Hx   Social History   Socioeconomic History   Marital status: Single    Spouse name: Not on file   Number of children: Not on file   Years of education: Not on file   Highest education level: Not on file  Occupational History   Occupation: Occupational Therapist  Tobacco Use   Smoking status: Never   Smokeless tobacco: Never  Vaping Use   Vaping Use: Never used  Substance and Sexual Activity   Alcohol use: Yes    Alcohol/week: 0.0 standard drinks of alcohol    Comment: occasional   Drug use: No   Sexual activity: Yes    Birth control/protection: Rhythm  Other Topics Concern   Not on file  Social History Narrative   Not on file   Social Determinants of  Health   Financial Resource Strain: Medium Risk (02/04/2022)   Overall Financial Resource Strain (CARDIA)    Difficulty of Paying Living Expenses: Somewhat hard  Food Insecurity: No Food Insecurity (02/04/2022)   Hunger Vital Sign    Worried About Running Out of Food in the Last Year: Never true    Ran Out of Food in the Last Year: Never true  Transportation Needs: No Transportation Needs (02/04/2022)   PRAPARE - Hydrologist (Medical): No    Lack of Transportation (Non-Medical): No  Physical Activity: Inactive (02/04/2022)   Exercise Vital Sign    Days of Exercise per Week: 0 days    Minutes of Exercise per Session: 0 min  Stress: Stress Concern Present (02/04/2022)   [place]    Feeling of Stress : Rather much  Social Connections: Moderately Isolated (02/04/2022)   Social Connection and Isolation Panel [NHANES]    Frequency of Communication with Friends and Family: Three times a week    Frequency of Social Gatherings with Friends and Family: Once a week    Attends Religious Services: 1 to 4 times per year    Active Member of [employer] or Organizations: No    Attends Archivist Meetings: Never    Marital Status: Never married    Review  of Systems  Constitutional: Negative.   Respiratory: Negative.    Cardiovascular: Negative.     Objective:  BP 122/64   Pulse 86   Temp 97.9 F (36.6 C)   Ht '5\' 5"'$  (1.651 m)   Wt 252 lb 3.2 oz (114.4 kg)   SpO2 97%   BMI 41.97 kg/m      05/13/2022    8:35 AM 03/13/2022    7:00 AM 02/04/2022    8:36 AM  BP/Weight  Systolic BP 284 132 440  Diastolic BP 64 75 80  Wt. (Lbs) 252.2 251.4 253.5  BMI 41.97 kg/m2 41.84 kg/m2 41.86 kg/m2    Physical Exam Vitals and nursing note reviewed.  Constitutional:      General: She is not in acute distress.    Appearance: Normal appearance. She is obese.  HENT:     Head: Normocephalic and  atraumatic.     Right Ear: Tympanic membrane normal.     Left Ear: Tympanic membrane normal.  Eyes:     General:        Right eye: No discharge.        Left eye: No discharge.     Conjunctiva/sclera: Conjunctivae normal.  Cardiovascular:     Rate and Rhythm: Normal rate and regular rhythm.  Pulmonary:     Effort: Pulmonary effort is normal.     Breath sounds: Normal breath sounds. No wheezing, rhonchi or rales.  Abdominal:     General: There is no distension.     Palpations: Abdomen is soft.     Tenderness: There is no abdominal tenderness.  Neurological:     Mental Status: She is alert.  Psychiatric:        Mood and Affect: Mood normal.        Behavior: Behavior normal.     Lab Results  Component Value Date   WBC 5.0 01/28/2016   HGB 13.6 01/28/2016   HCT 39.8 01/28/2016   PLT 132 (L) 01/28/2016   GLUCOSE 85 12/24/2021   CHOL 161 11/04/2021   TRIG 181 (H) 11/04/2021   HDL 40 11/04/2021   LDLCALC 90 11/04/2021   ALT 17 11/04/2021   AST 17 11/04/2021   NA 141 12/24/2021   K 4.2 12/24/2021   CL 100 12/24/2021   CREATININE 0.79 12/24/2021   BUN 16 12/24/2021   CO2 17 (L) 12/24/2021   TSH 2.390 11/04/2021   HGBA1C 5.5 11/04/2021     Assessment & Plan:   Problem List Items Addressed This Visit       Cardiovascular and Mediastinum   Essential hypertension - Primary    Well-controlled on amlodipine.  Continue.  Refilled today.      Relevant Medications   amLODipine (NORVASC) 10 MG tablet     Other   Anxiety disorder    Stable on Lexapro.  Continue.      Class 3 severe obesity due to excess calories with serious comorbidity and body mass index (BMI) of 40.0 to 44.9 in adult [facility])    Patient to continue to work on weight loss.  She will continue following up with healthy weight and wellness.      Other Visit Diagnoses     Need for vaccination       Relevant Orders   Flu Vaccine QUAD 6+ mos PF IM (Fluarix Quad PF) (Completed)       Meds ordered  this encounter  Medications   amLODipine (NORVASC) 10 MG tablet    Sig:  Take 1 tablet (10 mg total) by mouth daily.    Dispense:  90 tablet    Refill:  3    Follow-up:  Annually  [place]

## 2022-05-13 NOTE — Patient Instructions (Addendum)
Continue your medications.  Continue working on weight loss. If I can help in any way please let me know.  Follow up annually.  Take care  Dr. Lacinda Axon

## 2022-05-13 NOTE — Assessment & Plan Note (Signed)
Well-controlled on amlodipine.  Continue.  Refilled today.

## 2022-05-13 NOTE — Assessment & Plan Note (Signed)
Patient to continue to work on weight loss.  She will continue following up with healthy weight and wellness.

## 2022-05-13 NOTE — Assessment & Plan Note (Signed)
Stable on Lexapro.  Continue.

## 2022-05-22 ENCOUNTER — Ambulatory Visit
Admission: RE | Admit: 2022-05-22 | Discharge: 2022-05-22 | Disposition: A | Discharge status: Home / Self Care | Payer: 59 | Source: Ambulatory Visit | Attending: Nurse Practitioner | Admitting: Nurse Practitioner

## 2022-05-22 ENCOUNTER — Other Ambulatory Visit: Payer: Self-pay

## 2022-05-22 VITALS — BP 132/83 | HR 98 | Temp 98.5°F | Resp 20

## 2022-05-22 DIAGNOSIS — Z1152 Encounter for screening for COVID-19: Secondary | ICD-10-CM | POA: Insufficient documentation

## 2022-05-22 DIAGNOSIS — J0140 Acute pansinusitis, unspecified: Secondary | ICD-10-CM | POA: Insufficient documentation

## 2022-05-22 LAB — RESP PANEL BY RT-PCR (FLU A&B, COVID) ARPGX2
Influenza A by PCR: NEGATIVE
Influenza B by PCR: NEGATIVE
SARS Coronavirus 2 by RT PCR: POSITIVE — AB

## 2022-05-22 MED ORDER — FLUTICASONE PROPIONATE 50 MCG/ACT NA SUSP: 2.0000 | Freq: Every day | NASAL | 0 refills | Status: DC

## 2022-05-22 MED ORDER — PROMETHAZINE-DM 6.25-15 MG/5ML PO SYRP: 5.0000 mL | ORAL_SOLUTION | Freq: Four times a day (QID) | ORAL | 0 refills | Status: DC | PRN

## 2022-05-22 MED ORDER — DOXYCYCLINE HYCLATE 100 MG PO TABS: 100.0000 mg | ORAL_TABLET | Freq: Two times a day (BID) | ORAL | 0 refills | Status: DC

## 2022-05-22 NOTE — ED Provider Notes (Signed)
[facility]    CSN: [id number] Arrival date & time: 05/22/22  1750      History   Chief Complaint Chief Complaint  Patient presents with   Nasal Congestion    Fever, cough, headache, congestion - Entered by patient    HPI Alexandra Jones is a 26 y.o. female.   The history is provided by the patient.   Patient presents with a 1 week history of cough, nasal congestion, sore throat headache, lateral ear pressure/fullness, and sinus pain.  Patient states that over the last several days, her symptoms have progressively worsened.  She states that she has been running a fever intermittently.  She did state that she had a low-grade temperature last evening around 99.  She states that she does have a history of seasonal allergies and has been taking her allergy medication daily, which is Claritin.  She states that she has also been using Afrin nasal spray for her symptoms.  Past Medical History:  Diagnosis Date   Anxiety disorder 03/14/2019   Last Assessment & Plan:  Controlled with Lexapro 5 mg PO QD.   Diarrhea 05/01/2017   GERD (gastroesophageal reflux disease)    High blood pressure    Lactose intolerance     Patient Active Problem List   Diagnosis Date Noted   Essential hypertension 05/13/2022   Mixed hyperlipidemia 05/13/2022   Vitamin D deficiency 03/13/2022   Class 3 severe obesity due to excess calories with serious comorbidity and body mass index (BMI) of 40.0 to 44.9 in adult [facility]) 10/01/2020   GERD (gastroesophageal reflux disease)    Anxiety disorder 03/14/2019   IUD (intrauterine device) in place 01/13/2018    Past Surgical History:  Procedure Laterality Date   CHOLECYSTECTOMY     COLONOSCOPY N/A 06/22/2017   Procedure: COLONOSCOPY;  Surgeon: Danie Binder, MD;  Location: [facility];  Service: Endoscopy;  Laterality: N/A;  1:45pm   wisdom teeth extraction      OB History     Gravida  0   Para  0   Term  0   Preterm  0   AB  0    Living  0      SAB  0   IAB  0   Ectopic  0   Multiple  0   Live Births  0            Home Medications    Prior to Admission medications   Medication Sig Start Date End Date Taking? Authorizing Provider  doxycycline (VIBRA-TABS) 100 MG tablet Take 1 tablet (100 mg total) by mouth 2 (two) times daily. 05/22/22  Yes Miya Luviano-Warren, Alda Lea, NP  fluticasone (FLONASE) 50 MCG/ACT nasal spray Place 2 sprays into both nostrils daily. 05/22/22  Yes Salima Rumer-Warren, Alda Lea, NP  promethazine-dextromethorphan (PROMETHAZINE-DM) 6.25-15 MG/5ML syrup Take 5 mLs by mouth 4 (four) times daily as needed for cough. 05/22/22  Yes Jevaeh Shams-Warren, Alda Lea, NP  acetaminophen (TYLENOL) 500 MG tablet Take 500 mg by mouth every 6 (six) hours as needed.    [provider]  amLODipine (NORVASC) 10 MG tablet Take 1 tablet (10 mg total) by mouth daily. 05/13/22   Coral Spikes, DO  Biotin 5 MG CAPS Take by mouth.    [provider]  calcium carbonate (TUMS - DOSED IN MG ELEMENTAL CALCIUM) 500 MG chewable tablet Chew 1 tablet by mouth as needed.    [provider]  Emollient (COLLAGEN EX) Apply topically.  1  scoop    [provider]  escitalopram (LEXAPRO) 10 MG tablet Take 1 tablet by mouth daily. 04/28/22   Whitmire, Joneen Boers, FNP  escitalopram (LEXAPRO) 5 MG tablet Take 1 tablet  by mouth daily. 04/28/22   Whitmire, Joneen Boers, FNP  famotidine (PEPCID) 20 MG tablet Take 20 mg by mouth as needed.     [provider]  ibuprofen (ADVIL) 400 MG tablet Take 400 mg by mouth every 6 (six) hours as needed.    [provider]  loratadine (CLARITIN) 10 MG tablet Take 10 mg by mouth daily.    [provider]  Melatonin-Pyridoxine (MELATONIN TR) 5-10 MG TBCR Take by mouth.    [provider]  metFORMIN (GLUCOPHAGE) 500 MG tablet Take 1 tablet by mouth daily with breakfast. 03/13/22   Dennard Nip D, MD  Multiple Vitamin (MULTI-VITAMINS) TABS Take 1  tablet by mouth.    [provider]  Probiotic Product (PROBIOTIC PO) Take by mouth.    [provider]  Semaglutide-Weight Management (WEGOVY) 2.4 MG/0.75ML SOAJ Inject 2.4 mg into the skin once a week. 04/28/22   Whitmire, Joneen Boers, FNP  Vitamin D, Ergocalciferol, (DRISDOL) 1.25 MG (50000 UNIT) CAPS capsule Take 1 capsule by mouth every 7 days. 04/28/22   Whitmire, Joneen Boers, FNP    Family History Family History  Problem Relation Age of Onset   High blood pressure Mother    High Cholesterol Mother    Thyroid disease Mother    Obesity Mother    Prostate cancer Father    High blood pressure Father    High Cholesterol Father    Depression Father    Obesity Father    Hypertension Maternal Grandmother    Breast cancer Maternal Grandmother 37   Kidney failure Maternal Grandmother    Colon cancer Maternal Grandfather 60   Heart failure Maternal Grandfather    Prostate cancer Maternal Grandfather    Celiac disease Neg Hx    Crohn's disease Neg Hx    Ulcerative colitis Neg Hx     Social History Social History   Tobacco Use   Smoking status: Never   Smokeless tobacco: Never  Vaping Use   Vaping Use: Never used  Substance Use Topics   Alcohol use: Yes    Alcohol/week: 0.0 standard drinks of alcohol    Comment: occasional   Drug use: No     Allergies   Hydrocodone, Lexapro [escitalopram], and Penicillins   Review of Systems Review of Systems Per HPI  Physical Exam Triage Vital Signs ED Triage Vitals  Enc Vitals Group     BP 05/22/22 1824 132/83     Pulse Rate 05/22/22 1824 98     Resp 05/22/22 1824 20     Temp 05/22/22 1824 98.5 F (36.9 C)     Temp Source 05/22/22 1824 Oral     SpO2 05/22/22 1824 97 %     Weight --      Height --      Head Circumference --      Peak Flow --      Pain Score 05/22/22 1825 3     Pain Loc --      Pain Edu? --      Excl. in [place]? --    No data found.  Updated Vital Signs BP 132/83 (BP Location: Right Arm)    Pulse 98   Temp 98.5 F (36.9 C) (Oral)   Resp 20   LMP  04/27/2022 (Approximate)   SpO2 97%   Visual Acuity Right Eye Distance:   Left Eye Distance:   Bilateral Distance:    Right Eye Near:   Left Eye Near:    Bilateral Near:     Physical Exam Vitals and nursing note reviewed.  Constitutional:      General: She is not in acute distress.    Appearance: Normal appearance.  HENT:     Head: Normocephalic.     Right Ear: Tympanic membrane, ear canal and external ear normal.     Left Ear: Tympanic membrane, ear canal and external ear normal.     Nose: Congestion present.     Mouth/Throat:     Mouth: Mucous membranes are moist.     Pharynx: Posterior oropharyngeal erythema present. No oropharyngeal exudate.  Eyes:     Extraocular Movements: Extraocular movements intact.     Conjunctiva/sclera: Conjunctivae normal.     Pupils: Pupils are equal, round, and reactive to light.  Cardiovascular:     Rate and Rhythm: Normal rate and regular rhythm.     Pulses: Normal pulses.     Heart sounds: Normal heart sounds.  Pulmonary:     Effort: Pulmonary effort is normal. No respiratory distress.     Breath sounds: Normal breath sounds. No stridor. No wheezing, rhonchi or rales.  Abdominal:     General: Bowel sounds are normal.     Palpations: Abdomen is soft.     Tenderness: There is abdominal tenderness.  Musculoskeletal:     Cervical back: Normal range of motion.  Lymphadenopathy:     Cervical: No cervical adenopathy.  Skin:    General: Skin is warm and dry.  Neurological:     General: No focal deficit present.     Mental Status: She is alert and oriented to person, place, and time.  Psychiatric:        Mood and Affect: Mood normal.        Behavior: Behavior normal.        Thought Content: Thought content normal.        Judgment: Judgment normal.      UC Treatments / Results  Labs (all labs ordered are listed, but only abnormal results are displayed) Labs Reviewed  RESP  PANEL BY RT-PCR (FLU A&B, COVID) [id number]    EKG   Radiology No results found.  Procedures Procedures (including critical care time)  Medications Ordered in UC Medications - No data to display  Initial Impression / Assessment and Plan / UC Course  I have reviewed the triage vital signs and the nursing notes.  Pertinent labs & imaging results that were available during my care of the patient were reviewed by me and considered in my medical decision making (see chart for details).  COVID and flu test pending.  We will otherwise manage for acute pansinusitis.  Physical exam findings reassuring and vital signs stable for discharge.  Patient will be prescribed doxycycline 100 mg, fluticasone nasal spray, and Promethazine DM 5 mL for her symptoms.  Patient was advised that she will be contacted if her pending COVID/flu test is positive, did explain to patient that she is out of the window to receive antiviral therapy.  Patient verbalizes understanding.  Advised supportive care, offered symptomatic relief. Counseled patient on potential for adverse effects with medications prescribed/recommended today, ER and return-to-clinic precautions discussed, patient verbalized understanding.   Final Clinical Impressions(s) / UC Diagnoses   Final diagnoses:  Encounter for screening for COVID-19  Acute pansinusitis, recurrence not specified     Discharge Instructions      COVID/flu test is pending. You will be contacted if the results are positive. As discussed, you are out of the window for antiviral therapy at this time. Take medication as directed. Continue your current allergy medication regimen. Increase fluids and get plenty of rest. May take over-the-counter ibuprofen or Tylenol as needed for pain, fever, or general discomfort. Recommend normal saline nasal spray to help with nasal congestion throughout the day. For your cough, it may be helpful to use a humidifier at bedtime during  sleep. If your symptoms fail to improve within the next 7 to 10 days, please follow-up with your primary care physician.     ED Prescriptions     Medication Sig Dispense Auth. Provider   doxycycline (VIBRA-TABS) 100 MG tablet Take 1 tablet (100 mg total) by mouth 2 (two) times daily. 14 tablet Aveleen Nevers-Warren, Alda Lea, NP   fluticasone (FLONASE) 50 MCG/ACT nasal spray Place 2 sprays into both nostrils daily. 16 g Dany Walther-Warren, Alda Lea, NP   promethazine-dextromethorphan (PROMETHAZINE-DM) 6.25-15 MG/5ML syrup Take 5 mLs by mouth 4 (four) times daily as needed for cough. 118 mL Reyaansh Merlo-Warren, Alda Lea, NP      PDMP not reviewed this encounter.   Tish Men, NP 05/22/22 1856

## 2022-05-22 NOTE — Discharge Instructions (Addendum)
COVID/flu test is pending. You will be contacted if the results are positive. As discussed, you are out of the window for antiviral therapy at this time. Take medication as directed. Continue your current allergy medication regimen. Increase fluids and get plenty of rest. May take over-the-counter ibuprofen or Tylenol as needed for pain, fever, or general discomfort. Recommend normal saline nasal spray to help with nasal congestion throughout the day. For your cough, it may be helpful to use a humidifier at bedtime during sleep. If your symptoms fail to improve within the next 7 to 10 days, please follow-up with your primary care physician.

## 2022-05-22 NOTE — ED Triage Notes (Signed)
Pt reports cough, nasal congestion, sore throat, fever blister x1 week.

## 2022-05-26 NOTE — Progress Notes (Unsigned)
TeleHealth Visit:  This visit was completed with telemedicine (audio/video) technology. Alexandra Jones has verbally consented to this TeleHealth visit. The patient is located at home, the provider is located at home. The participants in this visit include the listed provider and patient. The visit was conducted today via MyChart video.  OBESITY Alexandra Jones is here to discuss her progress with her obesity treatment plan along with follow-up of her obesity related diagnoses.   Today's visit was # 27 Starting weight: 250 lbs Starting date: 05/01/2021 Weight at last in office visit: 251 lbs on 03/13/22 Total weight loss: 0 lbs at last in office visit on 03/13/22. Today's reported weight: 252.4 pounds  Nutrition Plan: keeping a food journal and adhering to recommended goals of 1300-1400 calories and 80 gms protein. .   Current exercise: Walking for 30 minutes 1-2 times per week.  Interim History: Alexandra Jones is very frustrated with lack of weight loss.  She is journaling consistently and calories range between 1300-1500/day.  Protein is 80 or more grams per day.  Denies intake of sugar sweetened beverages. She has begun walking a few times per week as discussed at last office visit.  Hunger is well controlled on Wegovy 2.4 mg.  Dose was increased last office visit from 1.7 mg.  Has GERD day of injection and the day after.  She is managing this fairly well with Pepcid on these 2 days.  Assessment/Plan:  1. Fatigue She notes worsening fatigue over the past few months.  She reports sleep amount has not changed-getting 7 to 8 hours per night.  She reports that she snores.  Fatigue is worse around time of menses.  Periods are not especially heavy. Last hemoglobin I have access to was 13.6 in June 2017.  She denies heavy periods. TSH normal in March 2023. Vitamin D level is slightly low.  Plan: Check labs next office visit. Consider ordering sleep study.  2.  Other depression/emotional eating Alexandra Jones has had  issues with stress/emotional eating. Currently this is moderately controlled. Overall mood is stable. Denies  Medication(s): Lexapro 15 mg daily.  Plan: Refill Lexapro 5 mg and 10 mg daily for a total of 15 mg daily.   3. Vitamin D Deficiency Vitamin D is not at goal of 50.  Last vitamin D level was 37.1 on 11/04/2021. She is on weekly prescription Vitamin D 50,000 IU.  Notes fatigue. Lab Results  Component Value Date   VD25OH 37.1 11/04/2021   VD25OH 43.3 06/24/2021   VD25OH 38.8 02/26/2021    Plan: Refill prescription vitamin D 50,000 IU weekly.  4. Obesity: Current BMI 41.8 Refill Wegovy 2.4 mg weekly. Re-check IC  Alexandra Jones is currently in the action stage of change. As such, her goal is to continue with weight loss efforts.  She has agreed to keeping a food journal and adhering to recommended goals of 1200-1400 calories and 90 protein.   I am unsure why she is not losing weight at 1200 to 1500 cal/day.   Reduce daily calorie intake to 1200 to 1400 cal/day.  Increase protein to 90 g/day.  Exercise goals: Increase walking to 30 minutes 3 times per week.  Behavioral modification strategies: increasing lean protein intake, decreasing simple carbohydrates, and keeping a strict food journal.  Antoniette has agreed to follow-up with our clinic in 4 weeks, fasting labs and IC.  Patient aware to come at 645.  No orders of the defined types were placed in this encounter.   Medications Discontinued During This Encounter  Medication  Reason   Semaglutide-Weight Management (WEGOVY) 2.4 MG/0.75ML SOAJ Reorder   Vitamin D, Ergocalciferol, (DRISDOL) 1.25 MG (50000 UNIT) CAPS capsule Reorder   escitalopram (LEXAPRO) 10 MG tablet Reorder   escitalopram (LEXAPRO) 5 MG tablet Reorder     Meds ordered this encounter  Medications   Vitamin D, Ergocalciferol, (DRISDOL) 1.25 MG (50000 UNIT) CAPS capsule    Sig: Take 1 capsule by mouth every 7 days.    Dispense:  4 capsule    Refill:  0     Order Specific Question:   Supervising Provider    Answer:   Dell Ponto [[id number]]   Semaglutide-Weight Management (WEGOVY) 2.4 MG/0.75ML SOAJ    Sig: Inject 2.4 mg into the skin once a week.    Dispense:  3 mL    Refill:  0    Order Specific Question:   Supervising Provider    Answer:   Dell Ponto [[id number]]   escitalopram (LEXAPRO) 10 MG tablet    Sig: Take 1 tablet by mouth daily.    Dispense:  30 tablet    Refill:  0    Order Specific Question:   Supervising Provider    Answer:   Dell Ponto [[id number]]   escitalopram (LEXAPRO) 5 MG tablet    Sig: Take 1 tablet  by mouth daily.    Dispense:  30 tablet    Refill:  0    Order Specific Question:   Supervising Provider    Answer:   Dell Ponto [[id number]]      Objective:   VITALS: Per patient if applicable, see vitals. GENERAL: Alert and in no acute distress. CARDIOPULMONARY: No increased WOB. Speaking in clear sentences.  PSYCH: Pleasant and cooperative. Speech normal rate and rhythm. Affect is appropriate. Insight and judgement are appropriate. Attention is focused, linear, and appropriate.  NEURO: Oriented as arrived to appointment on time with no prompting.   Lab Results  Component Value Date   CREATININE 0.79 12/24/2021   BUN 16 12/24/2021   NA 141 12/24/2021   K 4.2 12/24/2021   CL 100 12/24/2021   CO2 17 (L) 12/24/2021   Lab Results  Component Value Date   ALT 17 11/04/2021   AST 17 11/04/2021   ALKPHOS 73 11/04/2021   BILITOT <0.2 11/04/2021   Lab Results  Component Value Date   HGBA1C 5.5 11/04/2021   HGBA1C 5.6 06/24/2021   HGBA1C 5.5 02/26/2021   HGBA1C 5.8 (H) 10/01/2020   Lab Results  Component Value Date   INSULIN 22.1 11/04/2021   INSULIN 14.5 06/24/2021   INSULIN 25.5 (H) 02/26/2021   INSULIN 14.7 10/01/2020   Lab Results  Component Value Date   TSH 2.390 11/04/2021   Lab Results  Component Value Date   CHOL 161 11/04/2021   HDL 40 11/04/2021   LDLCALC 90 11/04/2021   TRIG 181 (H)  11/04/2021   CHOLHDL 5.1 (H) 03/18/2019   Lab Results  Component Value Date   WBC 5.0 01/28/2016   HGB 13.6 01/28/2016   HCT 39.8 01/28/2016   MCV 87.7 01/28/2016   PLT 132 (L) 01/28/2016   No results found for: "IRON", "TIBC", "FERRITIN" Lab Results  Component Value Date   VD25OH 37.1 11/04/2021   VD25OH 43.3 06/24/2021   VD25OH 38.8 02/26/2021    Attestation Statements:   Reviewed by clinician on day of visit: allergies, medications, problem list, medical history, surgical history, family history, social history, and previous encounter notes.

## 2022-05-27 ENCOUNTER — Encounter (INDEPENDENT_AMBULATORY_CARE_PROVIDER_SITE_OTHER): Payer: Self-pay | Admitting: Family Medicine

## 2022-05-27 ENCOUNTER — Telehealth (INDEPENDENT_AMBULATORY_CARE_PROVIDER_SITE_OTHER): Payer: 59 | Admitting: Family Medicine

## 2022-05-27 ENCOUNTER — Other Ambulatory Visit (HOSPITAL_COMMUNITY): Payer: Self-pay

## 2022-05-27 DIAGNOSIS — E669 Obesity, unspecified: Secondary | ICD-10-CM | POA: Diagnosis not present

## 2022-05-27 DIAGNOSIS — R5383 Other fatigue: Secondary | ICD-10-CM

## 2022-05-27 DIAGNOSIS — Z6841 Body Mass Index (BMI) 40.0 and over, adult: Secondary | ICD-10-CM

## 2022-05-27 DIAGNOSIS — F3289 Other specified depressive episodes: Secondary | ICD-10-CM | POA: Diagnosis not present

## 2022-05-27 DIAGNOSIS — E559 Vitamin D deficiency, unspecified: Secondary | ICD-10-CM | POA: Diagnosis not present

## 2022-05-27 MED ORDER — VITAMIN D (ERGOCALCIFEROL) 1.25 MG (50000 UNIT) PO CAPS
50000.0000 [IU] | ORAL_CAPSULE | ORAL | 0 refills | Status: DC
  Filled 2022-05-27: qty 4, 28d supply, fill #0

## 2022-05-27 MED ORDER — WEGOVY 2.4 MG/0.75ML ~~LOC~~ SOAJ
2.4000 mg | SUBCUTANEOUS | 0 refills | Status: DC
  Filled 2022-05-27: qty 3, 28d supply, fill #0

## 2022-05-27 MED ORDER — ESCITALOPRAM OXALATE 5 MG PO TABS
5.0000 mg | ORAL_TABLET | Freq: Every day | ORAL | 0 refills | Status: DC
  Filled 2022-05-27: qty 30, 30d supply, fill #0

## 2022-05-27 MED ORDER — ESCITALOPRAM OXALATE 10 MG PO TABS
10.0000 mg | ORAL_TABLET | Freq: Every day | ORAL | 0 refills | Status: DC
  Filled 2022-05-27: qty 30, 30d supply, fill #0

## 2022-05-28 ENCOUNTER — Other Ambulatory Visit (HOSPITAL_COMMUNITY): Payer: Self-pay

## 2022-06-02 ENCOUNTER — Other Ambulatory Visit (HOSPITAL_COMMUNITY): Payer: Self-pay

## 2022-06-06 ENCOUNTER — Other Ambulatory Visit (HOSPITAL_COMMUNITY): Payer: Self-pay

## 2022-06-23 ENCOUNTER — Other Ambulatory Visit (HOSPITAL_COMMUNITY): Payer: Self-pay

## 2022-06-25 ENCOUNTER — Encounter (INDEPENDENT_AMBULATORY_CARE_PROVIDER_SITE_OTHER): Payer: Self-pay | Admitting: *Deleted

## 2022-06-26 ENCOUNTER — Other Ambulatory Visit (HOSPITAL_COMMUNITY): Payer: Self-pay

## 2022-06-26 ENCOUNTER — Ambulatory Visit (INDEPENDENT_AMBULATORY_CARE_PROVIDER_SITE_OTHER): Payer: 59 | Admitting: Family Medicine

## 2022-06-26 ENCOUNTER — Encounter (INDEPENDENT_AMBULATORY_CARE_PROVIDER_SITE_OTHER): Payer: Self-pay | Admitting: Family Medicine

## 2022-06-26 VITALS — BP 114/74 | HR 84 | Temp 98.1°F | Ht 65.0 in | Wt 248.0 lb

## 2022-06-26 DIAGNOSIS — E669 Obesity, unspecified: Secondary | ICD-10-CM | POA: Diagnosis not present

## 2022-06-26 DIAGNOSIS — F3289 Other specified depressive episodes: Secondary | ICD-10-CM

## 2022-06-26 DIAGNOSIS — E782 Mixed hyperlipidemia: Secondary | ICD-10-CM | POA: Diagnosis not present

## 2022-06-26 DIAGNOSIS — Z6841 Body Mass Index (BMI) 40.0 and over, adult: Secondary | ICD-10-CM

## 2022-06-26 DIAGNOSIS — E559 Vitamin D deficiency, unspecified: Secondary | ICD-10-CM

## 2022-06-26 DIAGNOSIS — R7303 Prediabetes: Secondary | ICD-10-CM | POA: Diagnosis not present

## 2022-06-26 DIAGNOSIS — F32A Depression, unspecified: Secondary | ICD-10-CM | POA: Insufficient documentation

## 2022-06-26 MED ORDER — WEGOVY 2.4 MG/0.75ML ~~LOC~~ SOAJ
2.4000 mg | SUBCUTANEOUS | 0 refills | Status: DC
  Filled 2022-06-26: qty 3, 28d supply, fill #0

## 2022-06-26 MED ORDER — ESCITALOPRAM OXALATE 5 MG PO TABS
5.0000 mg | ORAL_TABLET | Freq: Every day | ORAL | 0 refills | Status: DC
  Filled 2022-06-26: qty 30, 30d supply, fill #0

## 2022-06-26 MED ORDER — ESCITALOPRAM OXALATE 10 MG PO TABS
10.0000 mg | ORAL_TABLET | Freq: Every day | ORAL | 0 refills | Status: DC
  Filled 2022-06-26: qty 30, 30d supply, fill #0

## 2022-06-26 MED ORDER — VITAMIN D (ERGOCALCIFEROL) 1.25 MG (50000 UNIT) PO CAPS
50000.0000 [IU] | ORAL_CAPSULE | ORAL | 0 refills | Status: DC
  Filled 2022-06-26: qty 4, 28d supply, fill #0

## 2022-06-26 MED ORDER — METFORMIN HCL 500 MG PO TABS
500.0000 mg | ORAL_TABLET | Freq: Every day | ORAL | 1 refills | Status: DC
  Filled 2022-06-26: qty 30, 30d supply, fill #0

## 2022-06-27 ENCOUNTER — Other Ambulatory Visit (HOSPITAL_COMMUNITY): Payer: Self-pay

## 2022-06-28 LAB — HEMOGLOBIN A1C
Est. average glucose Bld gHb Est-mCnc: 111 mg/dL
Hgb A1c MFr Bld: 5.5 % (ref 4.8–5.6)

## 2022-06-28 LAB — CMP14+EGFR
ALT: 19 IU/L (ref 0–32)
AST: 18 IU/L (ref 0–40)
Albumin/Globulin Ratio: 1.6 (ref 1.2–2.2)
Albumin: 4.5 g/dL (ref 4.0–5.0)
Alkaline Phosphatase: 58 IU/L (ref 44–121)
BUN/Creatinine Ratio: 18 (ref 9–23)
BUN: 12 mg/dL (ref 6–20)
Bilirubin Total: 0.3 mg/dL (ref 0.0–1.2)
CO2: 22 mmol/L (ref 20–29)
Calcium: 9.2 mg/dL (ref 8.7–10.2)
Chloride: 104 mmol/L (ref 96–106)
Creatinine, Ser: 0.65 mg/dL (ref 0.57–1.00)
Globulin, Total: 2.8 g/dL (ref 1.5–4.5)
Glucose: 92 mg/dL (ref 70–99)
Potassium: 4.6 mmol/L (ref 3.5–5.2)
Sodium: 141 mmol/L (ref 134–144)
Total Protein: 7.3 g/dL (ref 6.0–8.5)
eGFR: 123 mL/min/{1.73_m2} (ref >59)

## 2022-06-28 LAB — LIPID PANEL WITH LDL/HDL RATIO
Cholesterol, Total: 167 mg/dL (ref 100–199)
HDL: 40 mg/dL (ref >39)
LDL Chol Calc (NIH): 104 mg/dL — ABNORMAL HIGH (ref 0–99)
LDL/HDL Ratio: 2.6 ratio (ref 0.0–3.2)
Triglycerides: 126 mg/dL (ref 0–149)
VLDL Cholesterol Cal: 23 mg/dL (ref 5–40)

## 2022-06-28 LAB — VITAMIN B12: Vitamin B-12: 474 pg/mL (ref 232–1245)

## 2022-06-28 LAB — INSULIN, RANDOM: INSULIN: 31.1 u[IU]/mL — ABNORMAL HIGH (ref 2.6–24.9)

## 2022-06-28 LAB — VITAMIN D 25 HYDROXY (VIT D DEFICIENCY, FRACTURES): Vit D, 25-Hydroxy: 38.4 ng/mL (ref 30.0–100.0)

## 2022-07-08 NOTE — Progress Notes (Signed)
Chief Complaint:   OBESITY Alexandra Jones is here to discuss her progress with her obesity treatment plan along with follow-up of her obesity related diagnoses. Alexandra Jones is on keeping a food journal and adhering to recommended goals of 1200-1400 calories and 90 grams of protein and states she is following her eating plan approximately 75% of the time. Alexandra Jones states she is horseback riding, and weight lifting for 20-60 minutes 2 times per week.  Today's visit was #: 28 Starting weight: 250 lbs Starting date: 05/01/2021 Today's weight: 248 lbs Today's date: 06/26/2022 Total lbs lost to date: 2 Total lbs lost since last in-office visit: 3  Interim History: Alexandra Jones is doing well with weight loss.  She is still struggling with cravings for sugar.  She is not sure if the Mancel Parsons is helping her.  Subjective:   1. Pre-diabetes Alexandra Jones is stable on metformin, and she is working on her diet. She is due for labs.   2. Vitamin D deficiency Alexandra Jones is on Vitamin D, and she is due for labs.   3. Mixed hyperlipidemia Alexandra Jones is due for labs, and she is working on decreasing cholesterol in her diet.   4. Other depression with emotional eating Alexandra Jones is doing well on Lexapro, and she feels she is doing better with decreasing emotional eating behaviors.   Assessment/Plan:   1. Pre-diabetes We will check labs today, and we will refill metformin for 2 months. Alexandra Jones will continue to work on weight loss, exercise, and decreasing simple carbohydrates to help decrease the risk of diabetes.   - metFORMIN (GLUCOPHAGE) 500 MG tablet; Take 1 tablet (500 mg total) by mouth daily with breakfast.  Dispense: 30 tablet; Refill: 1 - CMP14+EGFR - Vitamin B12 - Insulin, random - Hemoglobin A1c  2. Vitamin D deficiency We will check labs today, and we will refill prescription Vitamin D for 1 month. Alexandra Jones will follow-up for routine testing of Vitamin D, at least 2-3 times per year to avoid over-replacement.  - Vitamin D,  Ergocalciferol, (DRISDOL) 1.25 MG (50000 UNIT) CAPS capsule; Take 1 capsule (50,000 Units total) by mouth every 7 (seven) days.  Dispense: 4 capsule; Refill: 0 - VITAMIN D 25 Hydroxy (Vit-D Deficiency, Fractures)  3. Mixed hyperlipidemia We will check labs today. Alexandra Jones will continue to work on diet, exercise and weight loss efforts. Orders and follow up as documented in patient record.   - Lipid Panel With LDL/HDL Ratio  4. Other depression with emotional eating Alexandra Jones will continue Lexapro 5 mg and 10 mg once daily, and we will refill both for 1 month.  - escitalopram (LEXAPRO) 5 MG tablet; Take 1 tablet (5 mg total) by mouth daily.  Dispense: 30 tablet; Refill: 0 - escitalopram (LEXAPRO) 10 MG tablet; Take 1 tablet (10 mg total) by mouth daily.  Dispense: 30 tablet; Refill: 0  5. Obesity, Current BMI 41.4 Alexandra Jones is currently in the action stage of change. As such, her goal is to continue with weight loss efforts. She has agreed to keeping a food journal and adhering to recommended goals of 1200-1400 calories and 90+ grams of protein daily.   We will refill Wegovy for 1 month.  Alexandra Jones is okay to stop and see if her hunger worsens, and she is okay to restart if she feels it helped more.  - Semaglutide-Weight Management (WEGOVY) 2.4 MG/0.75ML SOAJ; Inject 2.4 mg into the skin once a week.  Dispense: 3 mL; Refill: 0  Exercise goals: As is.   Behavioral modification  strategies: increasing lean protein intake and holiday eating strategies .  Alexandra Jones has agreed to follow-up with our clinic in 4 weeks. She was informed of the importance of frequent follow-up visits to maximize her success with intensive lifestyle modifications for her multiple health conditions.   Alexandra Jones was informed we would discuss her lab results at her next visit unless there is a critical issue that needs to be addressed sooner. Alexandra Jones agreed to keep her next visit at the agreed upon time to discuss these results.  Objective:    Blood pressure 114/74, pulse 84, temperature 98.1 F (36.7 C), height _0  (1.651 m), weight 248 lb (112.5 kg), SpO2 98 %. Body mass index is 41.27 kg/m.  General: Cooperative, alert, well developed, in no acute distress. HEENT: Conjunctivae and lids unremarkable. Cardiovascular: Regular rhythm.  Lungs: Normal work of breathing. Neurologic: No focal deficits.   Lab Results  Component Value Date   CREATININE 0.65 06/26/2022   BUN 12 06/26/2022   NA 141 06/26/2022   K 4.6 06/26/2022   CL 104 06/26/2022   CO2 22 06/26/2022   Lab Results  Component Value Date   ALT 19 06/26/2022   AST 18 06/26/2022   ALKPHOS 58 06/26/2022   BILITOT 0.3 06/26/2022   Lab Results  Component Value Date   HGBA1C 5.5 06/26/2022   HGBA1C 5.5 11/04/2021   HGBA1C 5.6 06/24/2021   HGBA1C 5.5 02/26/2021   HGBA1C 5.8 (H) 10/01/2020   Lab Results  Component Value Date   INSULIN 31.1 (H) 06/26/2022   INSULIN 22.1 11/04/2021   INSULIN 14.5 06/24/2021   INSULIN 25.5 (H) 02/26/2021   INSULIN 14.7 10/01/2020   Lab Results  Component Value Date   TSH 2.390 11/04/2021   Lab Results  Component Value Date   CHOL 167 06/26/2022   HDL 40 06/26/2022   LDLCALC 104 (H) 06/26/2022   TRIG 126 06/26/2022   CHOLHDL 5.1 (H) 03/18/2019   Lab Results  Component Value Date   VD25OH 38.4 06/26/2022   VD25OH 37.1 11/04/2021   VD25OH 43.3 06/24/2021   Lab Results  Component Value Date   WBC 5.0 01/28/2016   HGB 13.6 01/28/2016   HCT 39.8 01/28/2016   MCV 87.7 01/28/2016   PLT 132 (L) 01/28/2016   No results found for: "IRON", "TIBC", "FERRITIN"  Attestation Statements:   Reviewed by clinician on day of visit: allergies, medications, problem list, medical history, surgical history, family history, social history, and previous encounter notes.  I have personally spent 41 minutes total time today in preparation, patient care, and documentation for this visit, including the following: review of  clinical lab tests; review of medical tests/procedures/services.   I, Trixie Dredge, am acting as transcriptionist for Dennard Nip, MD.  I have reviewed the above documentation for accuracy and completeness, and I agree with the above. -  Dennard Nip, MD

## 2022-07-22 NOTE — Progress Notes (Unsigned)
TeleHealth Visit:  This visit was completed with telemedicine (audio/video) technology. Alexandra Jones has verbally consented to this TeleHealth visit. The patient is located at home, the provider is located at home. The participants in this visit include the listed provider and patient. The visit was conducted today via MyChart video.  OBESITY Alexandra Jones is here to discuss her progress with her obesity treatment plan along with follow-up of her obesity related diagnoses.   Today's visit was # 29 Starting weight: 250 lbs Starting date: 05/01/2021 Weight at last in office visit: 248 lbs on 06/26/22 Total weight loss: 2 lbs at last in office visit on 06/26/22. Today's reported weight: No weight reported.  Nutrition Plan: keeping a food journal and adhering to recommended goals of 1200-1400 calories and 90+ grams of protein daily. - 50-60% adherence   Current exercise: horseback riding, and weight lifting for 20-60 minutes 2-3 times per week.  Interim History:  [name] journals 6 days a week but does not always complete the day of journaling in [employer].  Breakfast and lunch are always journaled but dinner is harder because of entering the recipes that she cooks. She has been off Wegovy for 3 weeks and has not noticed a change in her hunger level.  She reports she always has increased hunger surrounding her menses.  GERD has improved since being off of Wegovy.   Assessment/Plan:  We discussed recent lab results in depth.  1.  Other depression/emotional eating [name] has had issues with stress/emotional eating.  Currently this is moderately well-controlled. Mood stable on Lexapro 15 mg daily.  Plan: Refill Lexapro 10 mg daily. Refill Lexapro 5 mg daily  2. Prediabetes Last A1c was 5.5 on 06/26/2022. Medication(s): Was on Wegovy until 3 weeks ago.  Currently on metformin 500 mg daily with breakfast. Lab Results  Component Value Date   HGBA1C 5.5 06/26/2022   Lab Results  Component  Value Date   INSULIN 31.1 (H) 06/26/2022   INSULIN 22.1 11/04/2021   INSULIN 14.5 06/24/2021   INSULIN 25.5 (H) 02/26/2021   INSULIN 14.7 10/01/2020    Plan: Refill metformin 500 mg daily with breakfast. Start Zepbound 7.5 mg if covered by insurance.   3. Obesity: Current BMI 42 New prescription-Zepbound 7.5 mg weekly. Patient instructed to use alternate form of birth control for 1 month after starting and anytime there is a change in dosage if sexually active.  [name] is currently in the action stage of change. As such, her goal is to continue with weight loss efforts.  She has agreed to keeping a food journal and adhering to recommended goals of 1200-1400 calories and 90+ grams of protein daily.   Encouraged her to work on Research [employer], trade [employer] and [employer].  Exercise goals:  as is  Behavioral modification strategies: increasing lean protein intake, decreasing simple carbohydrates, and planning for success.  Robertta has agreed to follow-up with our clinic in 4 weeks.   No orders of the defined types were placed in this encounter.   Medications Discontinued During This Encounter  Medication Reason   Semaglutide-Weight Management (WEGOVY) 2.4 MG/0.75ML SOAJ Change in therapy   escitalopram (LEXAPRO) 5 MG tablet Reorder   escitalopram (LEXAPRO) 10 MG tablet Reorder   metFORMIN (GLUCOPHAGE) 500 MG tablet Reorder     Meds ordered this encounter  Medications   Tirzepatide-Weight Management (ZEPBOUND) 7.5 MG/0.5ML SOAJ    Sig: Inject 7.5 mg into the skin once a week.    Dispense:  2 mL  Refill:  0    Order Specific Question:   Supervising Provider    Answer:   Netty Starring   metFORMIN (GLUCOPHAGE) 500 MG tablet    Sig: Take 1 tablet (500 mg total) by mouth daily with breakfast.    Dispense:  30 tablet    Refill:  1    Order Specific Question:   Supervising Provider    Answer:   Loyal Gambler E [[id number]]   escitalopram (LEXAPRO) 5 MG tablet    Sig: Take 1 tablet  (5 mg total) by mouth daily.    Dispense:  30 tablet    Refill:  0    Order Specific Question:   Supervising Provider    Answer:   Dell Ponto [[id number]]   escitalopram (LEXAPRO) 10 MG tablet    Sig: Take 1 tablet (10 mg total) by mouth daily.    Dispense:  30 tablet    Refill:  0    Order Specific Question:   Supervising Provider    Answer:   Dell Ponto [[id number]]      Objective:   VITALS: Per patient if applicable, see vitals. GENERAL: Alert and in no acute distress. CARDIOPULMONARY: No increased WOB. Speaking in clear sentences.  PSYCH: Pleasant and cooperative. Speech normal rate and rhythm. Affect is appropriate. Insight and judgement are appropriate. Attention is focused, linear, and appropriate.  NEURO: Oriented as arrived to appointment on time with no prompting.   Lab Results  Component Value Date   CREATININE 0.65 06/26/2022   BUN 12 06/26/2022   NA 141 06/26/2022   K 4.6 06/26/2022   CL 104 06/26/2022   CO2 22 06/26/2022   Lab Results  Component Value Date   ALT 19 06/26/2022   AST 18 06/26/2022   ALKPHOS 58 06/26/2022   BILITOT 0.3 06/26/2022   Lab Results  Component Value Date   HGBA1C 5.5 06/26/2022   HGBA1C 5.5 11/04/2021   HGBA1C 5.6 06/24/2021   HGBA1C 5.5 02/26/2021   HGBA1C 5.8 (H) 10/01/2020   Lab Results  Component Value Date   INSULIN 31.1 (H) 06/26/2022   INSULIN 22.1 11/04/2021   INSULIN 14.5 06/24/2021   INSULIN 25.5 (H) 02/26/2021   INSULIN 14.7 10/01/2020   Lab Results  Component Value Date   TSH 2.390 11/04/2021   Lab Results  Component Value Date   CHOL 167 06/26/2022   HDL 40 06/26/2022   LDLCALC 104 (H) 06/26/2022   TRIG 126 06/26/2022   CHOLHDL 5.1 (H) 03/18/2019   Lab Results  Component Value Date   WBC 5.0 01/28/2016   HGB 13.6 01/28/2016   HCT 39.8 01/28/2016   MCV 87.7 01/28/2016   PLT 132 (L) 01/28/2016   No results found for: "IRON", "TIBC", "FERRITIN" Lab Results  Component Value Date   VD25OH 38.4  06/26/2022   VD25OH 37.1 11/04/2021   VD25OH 43.3 06/24/2021    Attestation Statements:   Reviewed by clinician on day of visit: allergies, medications, problem list, medical history, surgical history, family history, social history, and previous encounter notes.

## 2022-07-23 ENCOUNTER — Telehealth (INDEPENDENT_AMBULATORY_CARE_PROVIDER_SITE_OTHER): Payer: 59 | Admitting: Family Medicine

## 2022-07-23 ENCOUNTER — Other Ambulatory Visit (HOSPITAL_COMMUNITY): Payer: Self-pay

## 2022-07-23 ENCOUNTER — Other Ambulatory Visit: Payer: Self-pay

## 2022-07-23 ENCOUNTER — Encounter (INDEPENDENT_AMBULATORY_CARE_PROVIDER_SITE_OTHER): Payer: Self-pay | Admitting: Family Medicine

## 2022-07-23 DIAGNOSIS — F3289 Other specified depressive episodes: Secondary | ICD-10-CM | POA: Diagnosis not present

## 2022-07-23 DIAGNOSIS — E669 Obesity, unspecified: Secondary | ICD-10-CM

## 2022-07-23 DIAGNOSIS — R7303 Prediabetes: Secondary | ICD-10-CM | POA: Diagnosis not present

## 2022-07-23 DIAGNOSIS — Z6841 Body Mass Index (BMI) 40.0 and over, adult: Secondary | ICD-10-CM

## 2022-07-23 MED ORDER — ESCITALOPRAM OXALATE 5 MG PO TABS
5.0000 mg | ORAL_TABLET | Freq: Every day | ORAL | 0 refills | Status: DC
  Filled 2022-07-23: qty 30, 30d supply, fill #0

## 2022-07-23 MED ORDER — ESCITALOPRAM OXALATE 10 MG PO TABS
10.0000 mg | ORAL_TABLET | Freq: Every day | ORAL | 0 refills | Status: DC
  Filled 2022-07-23: qty 30, 30d supply, fill #0

## 2022-07-23 MED ORDER — ZEPBOUND 7.5 MG/0.5ML ~~LOC~~ SOAJ
7.5000 mg | SUBCUTANEOUS | 0 refills | Status: DC
  Filled 2022-07-23: qty 2, 28d supply, fill #0

## 2022-07-23 MED ORDER — METFORMIN HCL 500 MG PO TABS
500.0000 mg | ORAL_TABLET | Freq: Every day | ORAL | 1 refills | Status: DC
  Filled 2022-07-23: qty 30, 30d supply, fill #0
  Filled 2022-08-20: qty 30, 30d supply, fill #1

## 2022-07-31 ENCOUNTER — Telehealth: Payer: Self-pay

## 2022-07-31 NOTE — Telephone Encounter (Signed)
Prior Auth for Zepbound has been approved.  This authorization is effective from 07/30/2022-01/26/2023. This request has been approved for 19m per 28 days. Pt has been notified.

## 2022-08-19 ENCOUNTER — Other Ambulatory Visit: Payer: Self-pay

## 2022-08-19 ENCOUNTER — Other Ambulatory Visit (HOSPITAL_COMMUNITY): Payer: Self-pay

## 2022-08-19 ENCOUNTER — Ambulatory Visit (INDEPENDENT_AMBULATORY_CARE_PROVIDER_SITE_OTHER): Payer: Commercial Managed Care - PPO | Admitting: Family Medicine

## 2022-08-19 ENCOUNTER — Encounter (INDEPENDENT_AMBULATORY_CARE_PROVIDER_SITE_OTHER): Payer: Self-pay | Admitting: Family Medicine

## 2022-08-19 DIAGNOSIS — F3289 Other specified depressive episodes: Secondary | ICD-10-CM

## 2022-08-19 DIAGNOSIS — Z6841 Body Mass Index (BMI) 40.0 and over, adult: Secondary | ICD-10-CM

## 2022-08-19 DIAGNOSIS — E669 Obesity, unspecified: Secondary | ICD-10-CM | POA: Diagnosis not present

## 2022-08-19 DIAGNOSIS — E559 Vitamin D deficiency, unspecified: Secondary | ICD-10-CM

## 2022-08-19 MED ORDER — ESCITALOPRAM OXALATE 10 MG PO TABS
10.0000 mg | ORAL_TABLET | Freq: Every day | ORAL | 0 refills | Status: DC
  Filled 2022-08-19: qty 30, 30d supply, fill #0

## 2022-08-19 MED ORDER — ZEPBOUND 7.5 MG/0.5ML ~~LOC~~ SOAJ
7.5000 mg | SUBCUTANEOUS | 0 refills | Status: DC
  Filled 2022-08-19: qty 2, 28d supply, fill #0

## 2022-08-19 MED ORDER — VITAMIN D (ERGOCALCIFEROL) 1.25 MG (50000 UNIT) PO CAPS
50000.0000 [IU] | ORAL_CAPSULE | ORAL | 0 refills | Status: DC
  Filled 2022-08-19: qty 4, 28d supply, fill #0

## 2022-08-19 MED ORDER — ESCITALOPRAM OXALATE 5 MG PO TABS
5.0000 mg | ORAL_TABLET | Freq: Every day | ORAL | 0 refills | Status: DC
  Filled 2022-08-19: qty 30, 30d supply, fill #0

## 2022-08-20 ENCOUNTER — Other Ambulatory Visit (HOSPITAL_COMMUNITY): Payer: Self-pay

## 2022-09-01 NOTE — Progress Notes (Signed)
Chief Complaint:   OBESITY Alexandra Jones is here to discuss her progress with her obesity treatment plan along with follow-up of her obesity related diagnoses. Alexandra Jones is on keeping a food journal and adhering to recommended goals of 1200-1400 calories and 90+ grams of protein and states she is following her eating plan approximately 60% of the time. Alexandra Jones states she is walking and doing yoga for 30 minutes 2-3 times per week.  Today's visit was #: 30 Starting weight: 250 lbs Starting date: 10/01/2020 Today's weight: 254 lbs Today's date: 08/19/2022 Total lbs lost to date: 0 Total lbs lost since last in-office visit: 0  Interim History: Karalina was prescribed Zepbound last month, but could not get it yet. She has a new insurance and will need another prior authorization. She tried [name] with minimal benefit in the past.   Subjective:   1. Vitamin D deficiency Alexandra Jones is on Vitamin D, and she denies nausea, vomiting, or muscle weakness. She requests a refill today.   2. Emotinal Eating Behavior Alexandra Jones is doing well on 15 mg of Lexapro total. No side effects were noted.   Assessment/Plan:   1. Vitamin D deficiency Alexandra Jones will continue prescription Vitamin D, and we will refill for 1 month.   - Vitamin D, Ergocalciferol, (DRISDOL) 1.25 MG (50000 UNIT) CAPS capsule; Take 1 capsule (50,000 Units total) by mouth every 7 (seven) days.  Dispense: 4 capsule; Refill: 0  2. [place] will continue Lexapro 10 mg and 5 mg, and we will refill for 1 month.   - escitalopram (LEXAPRO) 10 MG tablet; Take 1 tablet (10 mg total) by mouth daily.  Dispense: 30 tablet; Refill: 0 - escitalopram (LEXAPRO) 5 MG tablet; Take 1 tablet (5 mg total) by mouth daily.  Dispense: 30 tablet; Refill: 0  3. Obesity, Current BMI 42.3 We discussed various medication options to help Alexandra Jones with her weight loss efforts and we both agreed to start Zepbound 7.5 mg once weekly with no refills.  - tirzepatide  (ZEPBOUND) 7.5 MG/0.5ML Pen; Inject 7.5 mg into the skin once a week.  Dispense: 2 mL; Refill: 0  Alexandra Jones is currently in the action stage of change. As such, her goal is to continue with weight loss efforts. She has agreed to change to following a lower carbohydrate, vegetable and lean protein rich diet plan.   Exercise goals: As is.   Behavioral modification strategies: increasing lean protein intake and meal planning and cooking strategies.  Alexandra Jones has agreed to follow-up with our clinic in 4 weeks. She was informed of the importance of frequent follow-up visits to maximize her success with intensive lifestyle modifications for her multiple health conditions.   Objective:   Blood pressure 116/75, pulse 82, temperature 97.9 F (36.6 C), height '5\' 5"'$  (1.651 m), weight 254 lb (115.2 kg), SpO2 96 %. Body mass index is 42.27 kg/m.  General: Cooperative, alert, well developed, in no acute distress. HEENT: Conjunctivae and lids unremarkable. Cardiovascular: Regular rhythm.  Lungs: Normal work of breathing. Neurologic: No focal deficits.   Lab Results  Component Value Date   CREATININE 0.65 06/26/2022   BUN 12 06/26/2022   NA 141 06/26/2022   K 4.6 06/26/2022   CL 104 06/26/2022   CO2 22 06/26/2022   Lab Results  Component Value Date   ALT 19 06/26/2022   AST 18 06/26/2022   ALKPHOS 58 06/26/2022   BILITOT 0.3 06/26/2022   Lab Results  Component Value Date   HGBA1C  5.5 06/26/2022   HGBA1C 5.5 11/04/2021   HGBA1C 5.6 06/24/2021   HGBA1C 5.5 02/26/2021   HGBA1C 5.8 (H) 10/01/2020   Lab Results  Component Value Date   INSULIN 31.1 (H) 06/26/2022   INSULIN 22.1 11/04/2021   INSULIN 14.5 06/24/2021   INSULIN 25.5 (H) 02/26/2021   INSULIN 14.7 10/01/2020   Lab Results  Component Value Date   TSH 2.390 11/04/2021   Lab Results  Component Value Date   CHOL 167 06/26/2022   HDL 40 06/26/2022   LDLCALC 104 (H) 06/26/2022   TRIG 126 06/26/2022   CHOLHDL 5.1 (H)  03/18/2019   Lab Results  Component Value Date   VD25OH 38.4 06/26/2022   VD25OH 37.1 11/04/2021   VD25OH 43.3 06/24/2021   Lab Results  Component Value Date   WBC 5.0 01/28/2016   HGB 13.6 01/28/2016   HCT 39.8 01/28/2016   MCV 87.7 01/28/2016   PLT 132 (L) 01/28/2016   No results found for: "IRON", "TIBC", "FERRITIN"  Attestation Statements:   Reviewed by clinician on day of visit: allergies, medications, problem list, medical history, surgical history, family history, social history, and previous encounter notes.   I, Trixie Dredge, am acting as transcriptionist for Dennard Nip, MD.  I have reviewed the above documentation for accuracy and completeness, and I agree with the above. -  Dennard Nip, MD

## 2022-09-15 NOTE — Progress Notes (Signed)
TeleHealth Visit:  This visit was completed with telemedicine (audio/video) technology. Alexandra Jones has verbally consented to this TeleHealth visit. The patient is located at home, the provider is located at home. The participants in this visit include the listed provider and patient. The visit was conducted today via MyChart video.  OBESITY Alexandra Jones is here to discuss her progress with her obesity treatment plan along with follow-up of her obesity related diagnoses.   Today's visit was # 31 Starting weight: 250 lbs Starting date: 10/01/2020 Weight at last in office visit: 254 lbs on 08/19/22 Total weight loss: 254 lbs at last in office visit on 08/19/22. Today's reported weight: *** lbs No weight reported.  Nutrition Plan: following a lower carbohydrate, vegetable and lean protein rich diet plan.   Current exercise: {exercise types:16438} walking and doing yoga for 30 minutes 2-3 times per week.  Interim History:  ***  Assessment/Plan:  1. ***  2. ***  3. ***  Obesity: Current BMI *** Anette {CHL AMB IS/IS NOT:210130109} currently in the action stage of change. As such, her goal is to {MWMwtloss#1:210800005}.  She has agreed to {MWMwtlossportion/plan2:23431}.   Exercise goals: {MWM EXERCISE RECS:23473}  Behavioral modification strategies: {MWMwtlossdietstrategies3:23432}.  Gerrica has agreed to follow-up with our clinic in {NUMBER 1-10:22536} weeks.   No orders of the defined types were placed in this encounter.   There are no discontinued medications.   No orders of the defined types were placed in this encounter.     Objective:   VITALS: Per patient if applicable, see vitals. GENERAL: Alert and in no acute distress. CARDIOPULMONARY: No increased WOB. Speaking in clear sentences.  PSYCH: Pleasant and cooperative. Speech normal rate and rhythm. Affect is appropriate. Insight and judgement are appropriate. Attention is focused, linear, and appropriate.  NEURO: Oriented  as arrived to appointment on time with no prompting.   Lab Results  Component Value Date   CREATININE 0.65 06/26/2022   BUN 12 06/26/2022   NA 141 06/26/2022   K 4.6 06/26/2022   CL 104 06/26/2022   CO2 22 06/26/2022   Lab Results  Component Value Date   ALT 19 06/26/2022   AST 18 06/26/2022   ALKPHOS 58 06/26/2022   BILITOT 0.3 06/26/2022   Lab Results  Component Value Date   HGBA1C 5.5 06/26/2022   HGBA1C 5.5 11/04/2021   HGBA1C 5.6 06/24/2021   HGBA1C 5.5 02/26/2021   HGBA1C 5.8 (H) 10/01/2020   Lab Results  Component Value Date   INSULIN 31.1 (H) 06/26/2022   INSULIN 22.1 11/04/2021   INSULIN 14.5 06/24/2021   INSULIN 25.5 (H) 02/26/2021   INSULIN 14.7 10/01/2020   Lab Results  Component Value Date   TSH 2.390 11/04/2021   Lab Results  Component Value Date   CHOL 167 06/26/2022   HDL 40 06/26/2022   LDLCALC 104 (H) 06/26/2022   TRIG 126 06/26/2022   CHOLHDL 5.1 (H) 03/18/2019   Lab Results  Component Value Date   WBC 5.0 01/28/2016   HGB 13.6 01/28/2016   HCT 39.8 01/28/2016   MCV 87.7 01/28/2016   PLT 132 (L) 01/28/2016   No results found for: "IRON", "TIBC", "FERRITIN" Lab Results  Component Value Date   VD25OH 38.4 06/26/2022   VD25OH 37.1 11/04/2021   VD25OH 43.3 06/24/2021    Attestation Statements:   Reviewed by clinician on day of visit: allergies, medications, problem list, medical history, surgical history, family history, social history, and previous encounter notes.  ***(delete if time-based billing not  used) Time spent on visit including the items listed below was *** minutes.  -preparing to see the patient (e.g., review of tests, history, previous notes) -obtaining and/or reviewing separately obtained history -counseling and educating the patient/family/caregiver -documenting clinical information in the electronic or other health record -ordering medications, tests, or procedures -independently interpreting results and  communicating results to the patient/ family/caregiver -referring and communicating with other health care professionals  -care coordination

## 2022-09-16 ENCOUNTER — Encounter (INDEPENDENT_AMBULATORY_CARE_PROVIDER_SITE_OTHER): Payer: Self-pay | Admitting: Family Medicine

## 2022-09-16 ENCOUNTER — Other Ambulatory Visit: Payer: Self-pay

## 2022-09-16 ENCOUNTER — Telehealth (INDEPENDENT_AMBULATORY_CARE_PROVIDER_SITE_OTHER): Payer: Commercial Managed Care - PPO | Admitting: Family Medicine

## 2022-09-16 DIAGNOSIS — E559 Vitamin D deficiency, unspecified: Secondary | ICD-10-CM

## 2022-09-16 DIAGNOSIS — F3289 Other specified depressive episodes: Secondary | ICD-10-CM

## 2022-09-16 DIAGNOSIS — E669 Obesity, unspecified: Secondary | ICD-10-CM | POA: Diagnosis not present

## 2022-09-16 DIAGNOSIS — Z6841 Body Mass Index (BMI) 40.0 and over, adult: Secondary | ICD-10-CM | POA: Diagnosis not present

## 2022-09-16 MED ORDER — VITAMIN D (ERGOCALCIFEROL) 1.25 MG (50000 UNIT) PO CAPS
50000.0000 [IU] | ORAL_CAPSULE | ORAL | 0 refills | Status: DC
  Filled 2022-09-16: qty 4, 28d supply, fill #0

## 2022-09-16 MED ORDER — ESCITALOPRAM OXALATE 10 MG PO TABS
10.0000 mg | ORAL_TABLET | Freq: Every day | ORAL | 0 refills | Status: DC
  Filled 2022-09-16: qty 30, 30d supply, fill #0

## 2022-09-16 MED ORDER — ZEPBOUND 10 MG/0.5ML ~~LOC~~ SOAJ
10.0000 mg | SUBCUTANEOUS | 0 refills | Status: DC
  Filled 2022-09-16: qty 2, 28d supply, fill #0

## 2022-09-16 MED ORDER — ESCITALOPRAM OXALATE 5 MG PO TABS
5.0000 mg | ORAL_TABLET | Freq: Every day | ORAL | 0 refills | Status: DC
  Filled 2022-09-16: qty 30, 30d supply, fill #0

## 2022-09-30 ENCOUNTER — Other Ambulatory Visit: Payer: Self-pay

## 2022-09-30 ENCOUNTER — Ambulatory Visit
Admission: EM | Admit: 2022-09-30 | Discharge: 2022-09-30 | Disposition: A | Discharge status: Home / Self Care | Payer: Commercial Managed Care - PPO | Attending: Family Medicine | Admitting: Family Medicine

## 2022-09-30 DIAGNOSIS — R42 Dizziness and giddiness: Secondary | ICD-10-CM | POA: Diagnosis not present

## 2022-09-30 LAB — POCT URINALYSIS DIP (MANUAL ENTRY)
Bilirubin, UA: NEGATIVE
Glucose, UA: NEGATIVE mg/dL
Ketones, POC UA: NEGATIVE mg/dL
Nitrite, UA: NEGATIVE
Protein Ur, POC: NEGATIVE mg/dL
Spec Grav, UA: <=1.005 — AB (ref 1.010–1.025)
Urobilinogen, UA: 0.2 E.U./dL
pH, UA: 6 (ref 5.0–8.0)

## 2022-09-30 LAB — POCT FASTING CBG KUC MANUAL ENTRY: POCT Glucose (KUC): 90 mg/dL (ref 70–99)

## 2022-09-30 MED ORDER — MECLIZINE HCL 25 MG PO TABS: 25.0000 mg | ORAL_TABLET | Freq: Three times a day (TID) | ORAL | 0 refills | Status: DC | PRN

## 2022-09-30 NOTE — ED Provider Notes (Addendum)
[facility]    CSN: [id number] Arrival date & time: 09/30/22  1057      History   Chief Complaint Chief Complaint  Patient presents with   Dizziness    HPI Alexandra Jones is a 27 y.o. female.   Patient presenting today with new onset dizziness, room spinning, feeling woozy and wobbly, legs feeling heavy that started this morning.  She states her blood pressure was also bit elevated at work when checked, 132/90 per patient.  She denies any associated head injury, chest pain, shortness of breath, palpitations, visual change, mental status change, numbness or tingling of extremities.  Denies any recent medication changes, dietary changes, recent illness or history of similar episodes.  So far not trying anything over-the-counter for symptoms.   Past Medical History:  Diagnosis Date   Anxiety disorder 03/14/2019   Last Assessment & Plan:  Controlled with Lexapro 5 mg PO QD.   Diarrhea 05/01/2017   GERD (gastroesophageal reflux disease)    High blood pressure    Lactose intolerance    Patient Active Problem List   Diagnosis Date Noted   Depression 06/26/2022   Essential hypertension 05/13/2022   Mixed hyperlipidemia 05/13/2022   Vitamin D deficiency 03/13/2022   Pre-diabetes 10/02/2020   Class 3 severe obesity with serious comorbidity and body mass index (BMI) of 40.0 to 44.9 in adult [facility]) 10/01/2020   GERD (gastroesophageal reflux disease)    Anxiety disorder 03/14/2019   IUD (intrauterine device) in place 01/13/2018   Past Surgical History:  Procedure Laterality Date   CHOLECYSTECTOMY     COLONOSCOPY N/A 06/22/2017   Procedure: COLONOSCOPY;  Surgeon: Danie Binder, MD;  Location: [facility];  Service: Endoscopy;  Laterality: N/A;  1:45pm   wisdom teeth extraction     OB History     Gravida  0   Para  0   Term  0   Preterm  0   AB  0   Living  0      SAB  0   IAB  0   Ectopic  0   Multiple  0   Live Births  0           Home  Medications    Prior to Admission medications   Medication Sig Start Date End Date Taking? Authorizing Provider  meclizine (ANTIVERT) 25 MG tablet Take 1 tablet (25 mg total) by mouth 3 (three) times daily as needed for dizziness. May cause drowsiness 09/30/22  Yes Volney American, PA-C  acetaminophen (TYLENOL) 500 MG tablet Take 500 mg by mouth every 6 (six) hours as needed.    [provider]  amLODipine (NORVASC) 10 MG tablet Take 1 tablet (10 mg total) by mouth daily. 05/13/22   Coral Spikes, DO  Biotin 5 MG CAPS Take by mouth.    [provider]  calcium carbonate (TUMS - DOSED IN MG ELEMENTAL CALCIUM) 500 MG chewable tablet Chew 1 tablet by mouth as needed.    [provider]  doxycycline (VIBRA-TABS) 100 MG tablet Take 1 tablet (100 mg total) by mouth 2 (two) times daily. 05/22/22   Leath-Warren, Alda Lea, NP  Emollient (COLLAGEN EX) Apply topically. 1  scoop    [provider]  escitalopram (LEXAPRO) 10 MG tablet Take 1 tablet (10 mg total) by mouth daily. 09/16/22   [place], Joneen Boers, FNP  escitalopram (LEXAPRO) 5 MG tablet Take 1 tablet (5 mg total) by mouth daily. 09/16/22  Whitmire, Dawn W, FNP  famotidine (PEPCID) 20 MG tablet Take 20 mg by mouth as needed.     [provider]  fluticasone (FLONASE) 50 MCG/ACT nasal spray Place 2 sprays into both nostrils daily. 05/22/22   Leath-Warren, Alda Lea, NP  ibuprofen (ADVIL) 400 MG tablet Take 400 mg by mouth every 6 (six) hours as needed.    [provider]  loratadine (CLARITIN) 10 MG tablet Take 10 mg by mouth daily.    [provider]  Melatonin-Pyridoxine (MELATONIN TR) 5-10 MG TBCR Take by mouth.    [provider]  Multiple Vitamin (MULTI-VITAMINS) TABS Take 1 tablet by mouth.    [provider]  Probiotic Product (PROBIOTIC PO) Take by mouth.    [provider]  promethazine-dextromethorphan (PROMETHAZINE-DM) 6.25-15 MG/5ML syrup Take 5  mLs by mouth 4 (four) times daily as needed for cough. 05/22/22   Leath-Warren, Alda Lea, NP  tirzepatide (ZEPBOUND) 10 MG/0.5ML Pen Inject 10 mg into the skin once a week. 09/16/22   Whitmire, Joneen Boers, FNP  Vitamin D, Ergocalciferol, (DRISDOL) 1.25 MG (50000 UNIT) CAPS capsule Take 1 capsule (50,000 Units total) by mouth every 7 (seven) days. 09/16/22   [place], Joneen Boers, FNP   Family History Family History  Problem Relation Age of Onset   High blood pressure Mother    High Cholesterol Mother    Thyroid disease Mother    Obesity Mother    Prostate cancer Father    High blood pressure Father    High Cholesterol Father    Depression Father    Obesity Father    Hypertension Maternal Grandmother    Breast cancer Maternal Grandmother 41   Kidney failure Maternal Grandmother    Colon cancer Maternal Grandfather 60   Heart failure Maternal Grandfather    Prostate cancer Maternal Grandfather    Celiac disease Neg Hx    Crohn's disease Neg Hx    Ulcerative colitis Neg Hx    Social History Social History   Tobacco Use   Smoking status: Never   Smokeless tobacco: Never  Vaping Use   Vaping Use: Never used  Substance Use Topics   Alcohol use: Yes    Alcohol/week: 0.0 standard drinks of alcohol    Comment: occasional   Drug use: No    Allergies   Hydrocodone and Penicillins  Review of Systems Review of Systems PER HPI  Physical Exam Triage Vital Signs ED Triage Vitals [09/30/22 1109]  Enc Vitals Group     BP 134/85     Pulse Rate 68     Resp 20     Temp 98 F (36.7 C)     Temp Source Oral     SpO2 97 %     Weight      Height      Head Circumference      Peak Flow      Pain Score 0     Pain Loc      Pain Edu?      Excl. in [place]?    Orthostatic VS for the past 24 hrs:  BP- Lying Pulse- Lying BP- Sitting Pulse- Sitting BP- Standing at 0 minutes Pulse- Standing at 0 minutes  09/30/22 1124 127/84 68 119/80 69 135/81 83    Updated Vital Signs BP 134/85 (BP Location:  Right Arm)   Pulse 68   Temp 98 F (36.7 C) (Oral)   Resp 20   LMP 09/30/2022   SpO2 97%  Visual Acuity Right Eye Distance:   Left Eye Distance:   Bilateral Distance:    Right Eye Near:   Left Eye Near:    Bilateral Near:     Physical Exam Vitals and nursing note reviewed.  Constitutional:      Appearance: Normal appearance. She is not ill-appearing.  HENT:     Head: Atraumatic.     Right Ear: Tympanic membrane normal.     Left Ear: Tympanic membrane normal.     Mouth/Throat:     Mouth: Mucous membranes are moist.  Eyes:     Extraocular Movements: Extraocular movements intact.     Conjunctiva/sclera: Conjunctivae normal.     Pupils: Pupils are equal, round, and reactive to light.  Cardiovascular:     Rate and Rhythm: Normal rate and regular rhythm.     Heart sounds: Normal heart sounds.  Pulmonary:     Effort: Pulmonary effort is normal.     Breath sounds: Normal breath sounds.  Musculoskeletal:        General: Normal range of motion.     Cervical back: Normal range of motion and neck supple.  Skin:    General: Skin is warm and dry.  Neurological:     General: No focal deficit present.     Mental Status: She is alert and oriented to person, place, and time.     Cranial Nerves: No cranial nerve deficit.     Motor: No weakness.     Gait: Gait normal.  Psychiatric:        Mood and Affect: Mood normal.        Thought Content: Thought content normal.        Judgment: Judgment normal.    UC Treatments / Results  Labs (all labs ordered are listed, but only abnormal results are displayed) Labs Reviewed  POCT URINALYSIS DIP (MANUAL ENTRY) - Abnormal; Notable for the following components:      Result Value   Color, UA light yellow (*)    Spec Grav, UA <=1.005 (*)    Blood, UA moderate (*)    Leukocytes, UA Small (1+) (*)    All other components within normal limits  COMPREHENSIVE METABOLIC PANEL  CBC WITH DIFFERENTIAL/PLATELET  TSH  POCT FASTING CBG [facility]  MANUAL ENTRY    EKG   Radiology No results found.  Procedures Procedures (including critical care time)  Medications Ordered in UC Medications - No data to display  Initial Impression / Assessment and Plan / UC Course  I have reviewed the triage vital signs and the nursing notes.  Pertinent labs & imaging results that were available during my care of the patient were reviewed by me and considered in my medical decision making (see chart for details).     Vital signs and exam today benign and very reassuring with no obvious causes of her symptoms.  Her EKG today shows normal sinus rhythm at 62 bpm without acute ST or T wave changes, orthostatic vital signs were without significant change, her random glucose today was also benign and her urinalysis shows only findings related to her being actively on her menstrual cycle.  Labs pending for further rule out and safety check, will treat for possible vertigo-like symptoms with meclizine as needed and discussed good hydration, rest, proper nutrition.  Strict return precautions given for any worsening symptoms.  45 minutes spent today in direct patient care, evaluation and education.  Final Clinical Impressions(s) / UC Diagnoses   Final diagnoses:  Dizziness     Discharge Instructions      Your workup and exam today was very reassuring with no obvious causes of your symptoms.  Will give you the treatment for vertigo in case this has anything to do with your dizziness and have you hydrate well, rest and monitor for improvement.  Your labs should be back tomorrow and somebody will call with any significant abnormalities.  Follow-up with your primary care provider for a recheck.  Return sooner if worsening    ED Prescriptions     Medication Sig Dispense Auth. Provider   meclizine (ANTIVERT) 25 MG tablet Take 1 tablet (25 mg total) by mouth 3 (three) times daily as needed for dizziness. May cause drowsiness 30 tablet Volney American, [place]      PDMP not reviewed this encounter.   Volney American, [place] 09/30/22 [place], [place], [place] 09/30/22 1321

## 2022-09-30 NOTE — Discharge Instructions (Signed)
Your workup and exam today was very reassuring with no obvious causes of your symptoms.  Will give you the treatment for vertigo in case this has anything to do with your dizziness and have you hydrate well, rest and monitor for improvement.  Your labs should be back tomorrow and somebody will call with any significant abnormalities.  Follow-up with your primary care provider for a recheck.  Return sooner if worsening

## 2022-09-30 NOTE — ED Triage Notes (Signed)
Pt reports this morning she felt dizzy and whoozy, legs feel heavy, feels like the room is spinning, and lightheadedness.    Pt states her bp was elevated at work it was 132/90

## 2022-10-01 LAB — CBC WITH DIFFERENTIAL/PLATELET
Basophils Absolute: 0 10*3/uL (ref 0.0–0.2)
Basos: 0 %
EOS (ABSOLUTE): 0.1 10*3/uL (ref 0.0–0.4)
Eos: 2 %
Hematocrit: 44.3 % (ref 34.0–46.6)
Hemoglobin: 14.8 g/dL (ref 11.1–15.9)
Immature Grans (Abs): 0 10*3/uL (ref 0.0–0.1)
Immature Granulocytes: 0 %
Lymphocytes Absolute: 1.9 10*3/uL (ref 0.7–3.1)
Lymphs: 26 %
MCH: 29.5 pg (ref 26.6–33.0)
MCHC: 33.4 g/dL (ref 31.5–35.7)
MCV: 88 fL (ref 79–97)
Monocytes Absolute: 0.5 10*3/uL (ref 0.1–0.9)
Monocytes: 7 %
Neutrophils Absolute: 4.8 10*3/uL (ref 1.4–7.0)
Neutrophils: 65 %
Platelets: 269 10*3/uL (ref 150–450)
RBC: 5.02 x10E6/uL (ref 3.77–5.28)
RDW: 13.6 % (ref 11.7–15.4)
WBC: 7.3 10*3/uL (ref 3.4–10.8)

## 2022-10-01 LAB — COMPREHENSIVE METABOLIC PANEL
ALT: 50 IU/L — ABNORMAL HIGH (ref 0–32)
AST: 27 IU/L (ref 0–40)
Albumin/Globulin Ratio: 1.8 (ref 1.2–2.2)
Albumin: 5.1 g/dL — ABNORMAL HIGH (ref 4.0–5.0)
Alkaline Phosphatase: 60 IU/L (ref 44–121)
BUN/Creatinine Ratio: 17 (ref 9–23)
BUN: 11 mg/dL (ref 6–20)
Bilirubin Total: 0.3 mg/dL (ref 0.0–1.2)
CO2: 20 mmol/L (ref 20–29)
Calcium: 9.7 mg/dL (ref 8.7–10.2)
Chloride: 103 mmol/L (ref 96–106)
Creatinine, Ser: 0.66 mg/dL (ref 0.57–1.00)
Globulin, Total: 2.9 g/dL (ref 1.5–4.5)
Glucose: 86 mg/dL (ref 70–99)
Potassium: 4.2 mmol/L (ref 3.5–5.2)
Sodium: 141 mmol/L (ref 134–144)
Total Protein: 8 g/dL (ref 6.0–8.5)
eGFR: 122 mL/min/{1.73_m2} (ref >59)

## 2022-10-01 LAB — TSH: TSH: 1.77 u[IU]/mL (ref 0.450–4.500)

## 2022-10-07 ENCOUNTER — Ambulatory Visit: Payer: Commercial Managed Care - PPO | Admitting: Family Medicine

## 2022-10-07 VITALS — Ht 65.0 in | Wt 251.8 lb

## 2022-10-07 DIAGNOSIS — R42 Dizziness and giddiness: Secondary | ICD-10-CM | POA: Insufficient documentation

## 2022-10-07 NOTE — Patient Instructions (Signed)
Referral placed.  If you have continued issues or worsen, please let me know.  Take care  Dr. Lacinda Axon

## 2022-10-07 NOTE — Progress Notes (Signed)
Subjective:  Patient ID: Alexandra Jones, female    DOB: Sep 21, 1995  Age: 27 y.o. MRN: [id number]  CC: Chief Complaint  Patient presents with   Follow-up    Dizziness- would like to discuss blood work from urgent care    HPI:  27 year old female presents for evaluation of the above.  Patient reports that last Tuesday she developed sudden onset dizziness.  She was evaluated at urgent care.  Had unremarkable laboratory workup as well as normal EKG.  She was discharged home on meclizine.  Patient states that the dizziness has improved.  However, she still feels "off".  She has periods of time where she feels like the room is moving.  No reports of palpitations.  She states that she has had this intermittently over the past year.  Typically occurs once a week.  She states that she feels "woozy".   Patient Active Problem List   Diagnosis Date Noted   Dizziness 10/07/2022   Depression 06/26/2022   Essential hypertension 05/13/2022   Mixed hyperlipidemia 05/13/2022   Vitamin D deficiency 03/13/2022   Pre-diabetes 10/02/2020   Class 3 severe obesity with serious comorbidity and body mass index (BMI) of 40.0 to 44.9 in adult [facility]) 10/01/2020   GERD (gastroesophageal reflux disease)    Anxiety disorder 03/14/2019   IUD (intrauterine device) in place 01/13/2018    Social Hx   Social History   Socioeconomic History   Marital status: Single    Spouse name: Not on file   Number of children: Not on file   Years of education: Not on file   Highest education level: Not on file  Occupational History   Occupation: Occupational Therapist  Tobacco Use   Smoking status: Never   Smokeless tobacco: Never  Vaping Use   Vaping Use: Never used  Substance and Sexual Activity   Alcohol use: Yes    Alcohol/week: 0.0 standard drinks of alcohol    Comment: occasional   Drug use: No   Sexual activity: Yes    Birth control/protection: Rhythm  Other Topics Concern   Not on file  Social History  Narrative   Not on file   Social Determinants of Health   Financial Resource Strain: Medium Risk (02/04/2022)   Overall Financial Resource Strain (CARDIA)    Difficulty of Paying Living Expenses: Somewhat hard  Food Insecurity: No Food Insecurity (02/04/2022)   Hunger Vital Sign    Worried About Running Out of Food in the Last Year: Never true    Ran Out of Food in the Last Year: Never true  Transportation Needs: No Transportation Needs (02/04/2022)   PRAPARE - Hydrologist (Medical): No    Lack of Transportation (Non-Medical): No  Physical Activity: Inactive (02/04/2022)   Exercise Vital Sign    Days of Exercise per Week: 0 days    Minutes of Exercise per Session: 0 min  Stress: Stress Concern Present (02/04/2022)   [place]    Feeling of Stress : Rather much  Social Connections: Moderately Isolated (02/04/2022)   Social Connection and Isolation Panel [NHANES]    Frequency of Communication with Friends and Family: Three times a week    Frequency of Social Gatherings with Friends and Family: Once a week    Attends Religious Services: 1 to 4 times per year    Active Member of [employer] or Organizations: No    Attends Archivist Meetings:  Never    Marital Status: Never married    Review of Systems Per HPI  Objective:  Ht '5\' 5"'$  (1.651 m)   Wt 251 lb 12.8 oz (114.2 kg)   LMP 09/30/2022   BMI 41.90 kg/m      10/07/2022   11:05 AM 09/30/2022   11:09 AM 08/19/2022    7:00 AM  BP/Weight  Systolic BP  [id number]  Diastolic BP  85 75  Wt. (Lbs) 251.8  254  BMI 41.9 kg/m2  42.27 kg/m2    Physical Exam Vitals and nursing note reviewed.  Constitutional:      Appearance: Normal appearance. She is obese. She is not ill-appearing.  HENT:     Head: Normocephalic and atraumatic.  Cardiovascular:     Rate and Rhythm: Normal rate and regular rhythm.  Pulmonary:     Effort: Pulmonary  effort is normal.     Breath sounds: Normal breath sounds. No wheezing, rhonchi or rales.  Neurological:     Mental Status: She is alert.  Psychiatric:        Mood and Affect: Mood normal.        Behavior: Behavior normal.     Lab Results  Component Value Date   WBC 7.3 09/30/2022   HGB 14.8 09/30/2022   HCT 44.3 09/30/2022   PLT 269 09/30/2022   GLUCOSE 86 09/30/2022   CHOL 167 06/26/2022   TRIG 126 06/26/2022   HDL 40 06/26/2022   LDLCALC 104 (H) 06/26/2022   ALT 50 (H) 09/30/2022   AST 27 09/30/2022   NA 141 09/30/2022   K 4.2 09/30/2022   CL 103 09/30/2022   CREATININE 0.66 09/30/2022   BUN 11 09/30/2022   CO2 20 09/30/2022   TSH 1.770 09/30/2022   HGBA1C 5.5 06/26/2022     Assessment & Plan:   Problem List Items Addressed This Visit       Other   Dizziness - Primary    Labs unremarkable.  EKG unremarkable.  There is no evidence of orthostasis at urgent care.  Her history is most consistent with vertigo.  I do not believe that this is cardiac in nature or disequilibrium.  Recommended vestibular rehab.  Advised that if this continues to persist or worsens we will proceed with further evaluation with head imaging and/or referral to cardiology.      Relevant Orders   Ambulatory referral to Physical Therapy    Follow-up:  Return if symptoms worsen or fail to improve.  Luis Lopez

## 2022-10-07 NOTE — Assessment & Plan Note (Signed)
Labs unremarkable.  EKG unremarkable.  There is no evidence of orthostasis at urgent care.  Her history is most consistent with vertigo.  I do not believe that this is cardiac in nature or disequilibrium.  Recommended vestibular rehab.  Advised that if this continues to persist or worsens we will proceed with further evaluation with head imaging and/or referral to cardiology.

## 2022-10-14 ENCOUNTER — Other Ambulatory Visit (HOSPITAL_COMMUNITY): Payer: Self-pay

## 2022-10-14 ENCOUNTER — Other Ambulatory Visit: Payer: Self-pay

## 2022-10-14 ENCOUNTER — Encounter (INDEPENDENT_AMBULATORY_CARE_PROVIDER_SITE_OTHER): Payer: Self-pay | Admitting: Family Medicine

## 2022-10-14 ENCOUNTER — Ambulatory Visit (INDEPENDENT_AMBULATORY_CARE_PROVIDER_SITE_OTHER): Payer: Commercial Managed Care - PPO | Admitting: Family Medicine

## 2022-10-14 VITALS — BP 109/71 | HR 69 | Temp 98.0°F | Ht 65.0 in | Wt 245.0 lb

## 2022-10-14 DIAGNOSIS — Z6841 Body Mass Index (BMI) 40.0 and over, adult: Secondary | ICD-10-CM

## 2022-10-14 DIAGNOSIS — E669 Obesity, unspecified: Secondary | ICD-10-CM | POA: Diagnosis not present

## 2022-10-14 DIAGNOSIS — E559 Vitamin D deficiency, unspecified: Secondary | ICD-10-CM | POA: Diagnosis not present

## 2022-10-14 DIAGNOSIS — E782 Mixed hyperlipidemia: Secondary | ICD-10-CM

## 2022-10-14 DIAGNOSIS — I10 Essential (primary) hypertension: Secondary | ICD-10-CM | POA: Diagnosis not present

## 2022-10-14 DIAGNOSIS — F3289 Other specified depressive episodes: Secondary | ICD-10-CM | POA: Diagnosis not present

## 2022-10-14 DIAGNOSIS — R7303 Prediabetes: Secondary | ICD-10-CM

## 2022-10-14 MED ORDER — ZEPBOUND 10 MG/0.5ML ~~LOC~~ SOAJ
10.0000 mg | SUBCUTANEOUS | 0 refills | Status: DC
  Filled 2022-10-14: qty 2, 28d supply, fill #0
  Filled 2022-11-06: qty 2, 28d supply, fill #1

## 2022-10-14 MED ORDER — ESCITALOPRAM OXALATE 10 MG PO TABS
10.0000 mg | ORAL_TABLET | Freq: Every day | ORAL | 0 refills | Status: DC
  Filled 2022-10-14: qty 30, 30d supply, fill #0

## 2022-10-14 MED ORDER — ESCITALOPRAM OXALATE 5 MG PO TABS
5.0000 mg | ORAL_TABLET | Freq: Every day | ORAL | 0 refills | Status: DC
  Filled 2022-10-14: qty 30, 30d supply, fill #0

## 2022-10-14 MED ORDER — VITAMIN D (ERGOCALCIFEROL) 1.25 MG (50000 UNIT) PO CAPS
50000.0000 [IU] | ORAL_CAPSULE | ORAL | 0 refills | Status: DC
  Filled 2022-10-14: qty 4, 28d supply, fill #0

## 2022-10-21 ENCOUNTER — Ambulatory Visit (HOSPITAL_COMMUNITY): Payer: Commercial Managed Care - PPO | Attending: Family Medicine

## 2022-10-21 ENCOUNTER — Other Ambulatory Visit: Payer: Self-pay

## 2022-10-21 DIAGNOSIS — R42 Dizziness and giddiness: Secondary | ICD-10-CM | POA: Insufficient documentation

## 2022-10-21 NOTE — Therapy (Signed)
OUTPATIENT PHYSICAL THERAPY VESTIBULAR EVALUATION     Patient Name: Alexandra Jones MRN: [id number] DOB:[birth year], 27 y.o., female Today's Date: 10/21/2022  END OF SESSION:  PT End of Session - 10/21/22 1432     Visit Number 1    Number of Visits 4    Date for PT Re-Evaluation 11/18/22    Authorization Type [facility] [employer]    PT Start Time 1430    PT Stop Time 1510    PT Time Calculation (min) 40 min    Activity Tolerance Patient tolerated treatment well    Behavior During Therapy [facility] for tasks assessed/performed             Past Medical History:  Diagnosis Date   Anxiety disorder 03/14/2019   Last Assessment & Plan:  Controlled with Lexapro 5 mg PO QD.   Diarrhea 05/01/2017   GERD (gastroesophageal reflux disease)    High blood pressure    Lactose intolerance    Past Surgical History:  Procedure Laterality Date   CHOLECYSTECTOMY     COLONOSCOPY N/A 06/22/2017   Procedure: COLONOSCOPY;  Surgeon: Danie Binder, MD;  Location: [facility];  Service: Endoscopy;  Laterality: N/A;  1:45pm   wisdom teeth extraction     Patient Active Problem List   Diagnosis Date Noted   Dizziness 10/07/2022   Depression 06/26/2022   Essential hypertension 05/13/2022   Mixed hyperlipidemia 05/13/2022   Vitamin D deficiency 03/13/2022   Pre-diabetes 10/02/2020   Class 3 severe obesity with serious comorbidity and body mass index (BMI) of 40.0 to 44.9 in adult [facility]) 10/01/2020   GERD (gastroesophageal reflux disease)    Anxiety disorder 03/14/2019   IUD (intrauterine device) in place 01/13/2018    PCP:   Coral Spikes, DO [place] [facility]   REFERRING PROVIDER:   Coral Spikes, DO [facility]    REFERRING DIAG: R42 (ICD-10-CM) - Dizziness  THERAPY DIAG:  Dizziness and giddiness  ONSET DATE: about a year  Rationale for Evaluation and Treatment: Rehabilitation  SUBJECTIVE:   SUBJECTIVE  STATEMENT: Wooziness (like the room is moving; or she is swimming) has been going on for a year or more; then one day at work 09/29/22 had an incident where room was spinning and felt lightheaded.  Got up to go to the bathroom and it got worse.  Went to urgent care.  Diagnosis was dehydration and possible low iron.  Blood work showed that all of that was fine.  Saw PCP and he referred to therapy. Hx of  Chronic ear infections.  Had last bad episode about 1.5 weeks ago; more frequent episodes of feeling woozy. Trouble concentrating when woozy.   Pt accompanied by: self  PERTINENT HISTORY: chronic ear infections; sees Te[facility]  PAIN:  Are you having pain? No  PRECAUTIONS: None  WEIGHT BEARING RESTRICTIONS: No  FALLS: Has patient fallen in last 6 months? No  PLOF: Independent  PATIENT GOALS: feel   OBJECTIVE:   DIAGNOSTIC FINDINGS:   COGNITION: Overall cognitive status: Within functional limits for tasks assessed   SENSATION: WFL   Cervical ROM:  AROM WNL throughout  Active A/PROM (deg) eval  Flexion   Extension   Right lateral flexion   Left lateral flexion   Right rotation   Left rotation   (Blank rows = not tested)  STRENGTH: not tested appears wfl throughout    BED MOBILITY:  Sit to supine Complete Independence Supine to sit  Complete Independence  TRANSFERS: Assistive device utilized: None  Sit to stand: Complete Independence Stand to sit: Complete Independence Chair to chair: Complete Independence Floor:  not tested  GAIT: Gait pattern: WFL Distance walked: 50 ft Assistive device utilized: None Level of assistance: Complete Independence Comments: no gait abnormalities noted  FUNCTIONAL TESTS:  5 times sit to stand: next visit Dynamic Gait Index: next visit  PATIENT SURVEYS:  DHI 14  VESTIBULAR ASSESSMENT:  GENERAL OBSERVATION: no glasses   SYMPTOM BEHAVIOR:  Subjective history: see above  Non-Vestibular symptoms:  history of chronic ear  infections  Type of dizziness: Imbalance (Disequilibrium), Spinning/Vertigo, Unsteady with head/body turns, Lightheadedness/Faint, and "Swimmyheaded"  Frequency: random  Duration: varies  Aggravating factors: Spontaneous  Relieving factors: no known relieving factors  Progression of symptoms: worse  OCULOMOTOR EXAM:  Ocular Alignment: normal  Ocular ROM: No Limitations  Spontaneous Nystagmus: absent  Gaze-Induced Nystagmus: absent  Smooth Pursuits: intact  Saccades: intact  Convergence/Divergence: not tested     VESTIBULAR - OCULAR REFLEX:   Slow VOR: Positive Right  VOR Cancellation: Comment: not tested  Head-Impulse Test: HIT Right: positive  Dynamic Visual Acuity:  not tested   POSITIONAL TESTING: Right Dix-Hallpike: no nystagmus Left Dix-Hallpike: no nystagmus  MOTION SENSITIVITY:  Motion Sensitivity Quotient Intensity: 0 = none, 1 = Lightheaded, 2 = Mild, 3 = Moderate, 4 = Severe, 5 = Vomiting  Intensity  1. Sitting to supine 0  2. Supine to L side   3. Supine to R side   4. Supine to sitting 2 (lightheaded)  5. L Hallpike-Dix 0  6. Up from L  2(lightheaded)  7. R Hallpike-Dix 0  8. Up from R  2(lightheaded)  9. Sitting, head tipped to L knee   10. Head up from L knee   11. Sitting, head tipped to R knee   12. Head up from R knee   13. Sitting head turns x5 0  14.Sitting head nods x5 0  15. In stance, 180 turn to L  0  16. In stance, 180 turn to R 0    OTHOSTATICS: not tested; performed previously at MD office without issue     VESTIBULAR TREATMENT:                                                                                                   DATE: 10/21/2022  Canalith Repositioning:  none Gaze Adaptation:  x1 Viewing Horizontal: Position: sitting, Time: 2, and Reps: 10 and x1 Viewing Vertical:  Position: 10, Time: x 1, and Reps: 10 Habituation:   Other:   PATIENT EDUCATION: Education details: Patient educated on exam findings, POC, scope of  PT, HEP, and what to expect next visit. Person educated: Patient Education method: Explanation, Demonstration, and Handouts Education comprehension: verbalized understanding, returned demonstration, verbal cues required, and tactile cues required  HOME EXERCISE PROGRAM:  GOALS: Goals reviewed with patient? No  SHORT TERM GOALS: Target date: 11/04/2022  patient will be independent with initial HEP  Baseline: Goal status: INITIAL  2.  Patient will self report 50% improvement to improve tolerance for  functional activity  Baseline:  Goal status: INITIAL    LONG TERM GOALS: Target date: 11/18/2022  Patient will be independent in self management strategies to improve quality of life and functional outcomes. Baseline:  Goal status: INITIAL  2.  Patient will self report 75% improvement to improve tolerance for functional activity  Baseline:  Goal status: INITIAL  3.  Patient will be able to go to the grocery store and shop for weekly groceries without compliant of dizziness Baseline:  Goal status: INITIAL  4.  Patient will improve her DHI score to 8 or less to demonstrate decreased dizziness with functional activity Baseline: 14 Goal status: INITIAL   ASSESSMENT:  CLINICAL IMPRESSION: Patient is a 27 y.o. female who was seen today for physical therapy evaluation and treatment for dizziness.  Patient demonstrates increased vestibular symptoms with provocative testing which is negatively impacting patient ability to perform ADLs and functional mobility tasks. Patient will benefit from skilled physical therapy services to address these deficits to improve level of function with ADLs, functional mobility tasks, and reduce risk for falls.    OBJECTIVE IMPAIRMENTS: decreased activity tolerance, dizziness, and impaired perceived functional ability.   ACTIVITY LIMITATIONS: locomotion level and general activities  PARTICIPATION LIMITATIONS: shopping, community activity, and  occupation  REHAB POTENTIAL: Good  CLINICAL DECISION MAKING: Evolving/moderate complexity  EVALUATION COMPLEXITY: Moderate   PLAN:  PT FREQUENCY: 1x/week  PT DURATION: 4 weeks  PLANNED INTERVENTIONS: Therapeutic exercises, Therapeutic activity, Neuromuscular re-education, Balance training, Gait training, Patient/Family education, Joint manipulation, Joint mobilization, Stair training, Orthotic/Fit training, DME instructions, Aquatic Therapy, Dry Needling, Electrical stimulation, Spinal manipulation, Spinal mobilization, Cryotherapy, Moist heat, Compression bandaging, scar mobilization, Splintting, Taping, Traction, Ultrasound, Ionotophoresis '4mg'$ /ml Dexamethasone, and Manual therapy   PLAN FOR NEXT SESSION: Review HEP and goals; progress gaze stabilization and habituation   3:21 PM, 10/21/22 Aneth Schlagel Small Gabreal Worton MPT [place] [facility] [place] [phone] [phone]

## 2022-10-22 LAB — CMP14+EGFR
ALT: 25 IU/L (ref 0–32)
AST: 19 IU/L (ref 0–40)
Albumin/Globulin Ratio: 1.7 (ref 1.2–2.2)
Albumin: 4.5 g/dL (ref 4.0–5.0)
Alkaline Phosphatase: 54 IU/L (ref 44–121)
BUN/Creatinine Ratio: 11 (ref 9–23)
BUN: 8 mg/dL (ref 6–20)
Bilirubin Total: 0.4 mg/dL (ref 0.0–1.2)
CO2: 22 mmol/L (ref 20–29)
Calcium: 9.3 mg/dL (ref 8.7–10.2)
Chloride: 102 mmol/L (ref 96–106)
Creatinine, Ser: 0.74 mg/dL (ref 0.57–1.00)
Globulin, Total: 2.7 g/dL (ref 1.5–4.5)
Glucose: 82 mg/dL (ref 70–99)
Potassium: 4.1 mmol/L (ref 3.5–5.2)
Sodium: 141 mmol/L (ref 134–144)
Total Protein: 7.2 g/dL (ref 6.0–8.5)
eGFR: 112 mL/min/{1.73_m2} (ref >59)

## 2022-10-22 LAB — LIPID PANEL WITH LDL/HDL RATIO
Cholesterol, Total: 158 mg/dL (ref 100–199)
HDL: 34 mg/dL — ABNORMAL LOW (ref >39)
LDL Chol Calc (NIH): 87 mg/dL (ref 0–99)
LDL/HDL Ratio: 2.6 ratio (ref 0.0–3.2)
Triglycerides: 217 mg/dL — ABNORMAL HIGH (ref 0–149)
VLDL Cholesterol Cal: 37 mg/dL (ref 5–40)

## 2022-10-22 LAB — INSULIN, RANDOM: INSULIN: 24.9 u[IU]/mL (ref 2.6–24.9)

## 2022-10-22 LAB — HEMOGLOBIN A1C
Est. average glucose Bld gHb Est-mCnc: 114 mg/dL
Hgb A1c MFr Bld: 5.6 % (ref 4.8–5.6)

## 2022-10-22 LAB — VITAMIN B12: Vitamin B-12: 415 pg/mL (ref 232–1245)

## 2022-10-22 LAB — VITAMIN D 25 HYDROXY (VIT D DEFICIENCY, FRACTURES): Vit D, 25-Hydroxy: 29.2 ng/mL — ABNORMAL LOW (ref 30.0–100.0)

## 2022-10-23 ENCOUNTER — Ambulatory Visit (HOSPITAL_COMMUNITY): Payer: Commercial Managed Care - PPO

## 2022-10-28 NOTE — Progress Notes (Unsigned)
Chief Complaint:   OBESITY Alexandra Jones is here to discuss her progress with her obesity treatment plan along with follow-up of her obesity related diagnoses. Alexandra Jones is on keeping a food journal and adhering to recommended goals of 1200-1400 calories and 90 grams of protein and states she is following her eating plan approximately 90% of the time. Alexandra Jones states she is walking for 20-30 minutes 1 time per week.  Today's visit was #: 1 Starting weight: 250 lbs Starting date: 10/01/2020 Today's weight: 245 lbs Today's date: 10/14/2022 Total lbs lost to date: 5 Total lbs lost since last in-office visit: 9  Interim History: Alexandra Jones changed to journaling and she is doing well with weight loss.  Her work is going to stop coverage of GLP-once due to cost which will definitely hinder her progress.  Subjective:   1. Essential hypertension Alexandra Jones's blood pressure is stable.  She had an episode of passing out 2 weeks ago.  She had labs done with no signs of dehydration.  I discussed labs with the patient today.    2. Vitamin D deficiency Alexandra Jones is on vitamin D, and she is due for labs.  3. Mixed hyperlipidemia Alexandra Jones is working on her diet and weight loss, and she is due for labs.  She is not on a statin.  4. Pre-diabetes Alexandra Jones is on a GLP-1, and she is working on decreasing simple carbohydrates, and she is working on weight loss.  5. [place] is working on decreasing emotional eating behaviors, and she is doing well on a total of 15 mg of Alexandra Jones.  Assessment/Plan:   1. Essential hypertension Alexandra Jones agreed to decrease amlodipine to 1/2 tablet daily (10 mg), and she is to check her blood pressure at work and we will follow-up at her next visit in 3 to 4 weeks.  2. Vitamin D deficiency We will check labs today, and we will refill prescription vitamin D for 1 month.  - VITAMIN D 25 Hydroxy (Vit-D Deficiency, Fractures) - Vitamin D, Ergocalciferol, (DRISDOL) 1.25 MG (50000  UNIT) CAPS capsule; Take 1 capsule (50,000 Units total) by mouth every 7 (seven) days.  Dispense: 4 capsule; Refill: 0  3. Mixed hyperlipidemia We will check labs today, and we will follow-up at Alexandra Jones next visit.  - Lipid Panel With LDL/HDL Ratio  4. Pre-diabetes We will check labs today, and we will follow-up at Alexandra Jones next visit.  - CMP14+EGFR - Vitamin B12 - Hemoglobin A1c - Insulin, random  5. Alexandra Jones will continue Alexandra Jones, and we will refill Alexandra Jones at 5 mg and at 10 mg for 1 month.  - escitalopram (Alexandra Jones) 10 MG tablet; Take 1 tablet (10 mg total) by mouth daily.  Dispense: 30 tablet; Refill: 0 - escitalopram (Alexandra Jones) 5 MG tablet; Take 1 tablet (5 mg total) by mouth daily.  Dispense: 30 tablet; Refill: 0  6. Obesity: Starting BMI 41  7. BMI 40.0-44.9, adult (Alexandra Jones) Alexandra Jones was given Zepbound 10 mg, with a 90 day supply. We will follow-up in 1 month.   - tirzepatide (ZEPBOUND) 10 MG/0.5ML Pen; Inject 10 mg into the skin once a week.  Dispense: 6 mL; Refill: 0  Alexandra Jones is currently in the action stage of change. As such, her goal is to continue with weight loss efforts. She has agreed to keeping a food journal and adhering to recommended goals of 1200-1400 calories and 90 grams of protein daily.   Exercise goals: As is.   Behavioral modification strategies:  increasing lean protein intake, decreasing simple carbohydrates, and no skipping meals.  Alexandra Jones has agreed to follow-up with our clinic in 4 weeks. She was informed of the importance of frequent follow-up visits to maximize her success with intensive lifestyle modifications for her multiple health conditions.   Alexandra Jones was informed we would discuss her lab results at her next visit unless there is a critical issue that needs to be addressed sooner. Alexandra Jones agreed to keep her next visit at the agreed upon time to discuss these results.  Objective:   Blood pressure 109/71, pulse 69, temperature 98 F  (36.7 C), height 5\' 5"  (1.651 m), weight 245 lb (111.1 kg), last menstrual period 09/30/2022, SpO2 99 %. Body mass index is 40.77 kg/m.  Lab Results  Component Value Date   CREATININE 0.74 10/14/2022   BUN 8 10/14/2022   NA 141 10/14/2022   K 4.1 10/14/2022   CL 102 10/14/2022   CO2 22 10/14/2022   Lab Results  Component Value Date   ALT 25 10/14/2022   AST 19 10/14/2022   ALKPHOS 54 10/14/2022   BILITOT 0.4 10/14/2022   Lab Results  Component Value Date   HGBA1C 5.6 10/14/2022   HGBA1C 5.5 06/26/2022   HGBA1C 5.5 11/04/2021   HGBA1C 5.6 06/24/2021   HGBA1C 5.5 02/26/2021   Lab Results  Component Value Date   INSULIN 24.9 10/14/2022   INSULIN 31.1 (H) 06/26/2022   INSULIN 22.1 11/04/2021   INSULIN 14.5 06/24/2021   INSULIN 25.5 (H) 02/26/2021   Lab Results  Component Value Date   TSH 1.770 09/30/2022   Lab Results  Component Value Date   CHOL 158 10/14/2022   HDL 34 (L) 10/14/2022   LDLCALC 87 10/14/2022   TRIG 217 (H) 10/14/2022   CHOLHDL 5.1 (H) 03/18/2019   Lab Results  Component Value Date   VD25OH 29.2 (L) 10/14/2022   VD25OH 38.4 06/26/2022   VD25OH 37.1 11/04/2021   Lab Results  Component Value Date   WBC 7.3 09/30/2022   HGB 14.8 09/30/2022   HCT 44.3 09/30/2022   MCV 88 09/30/2022   PLT 269 09/30/2022   No results found for: "IRON", "TIBC", "FERRITIN"  Attestation Statements:   Reviewed by clinician on day of visit: allergies, medications, problem list, medical history, surgical history, family history, social history, and previous encounter notes.  Time spent on visit including pre-visit chart review and post-visit care and charting was 42 minutes.   I, Trixie Dredge, am acting as transcriptionist for Dennard Nip, MD.  I have reviewed the above documentation for accuracy and completeness, and I agree with the above. -  Dennard Nip, MD

## 2022-11-04 ENCOUNTER — Ambulatory Visit (HOSPITAL_COMMUNITY): Payer: Commercial Managed Care - PPO

## 2022-11-04 DIAGNOSIS — R42 Dizziness and giddiness: Secondary | ICD-10-CM | POA: Diagnosis not present

## 2022-11-04 NOTE — Therapy (Signed)
OUTPATIENT PHYSICAL THERAPY VESTIBULAR TREATMENT     Patient Name: Alexandra Jones MRN: [id number] DOB:1996/01/17, 27 y.o., female Today's Date: 11/04/2022  END OF SESSION:  PT End of Session - 11/04/22 1306     Visit Number 2    Number of Visits 4    Date for PT Re-Evaluation 11/18/22    Authorization Type [facility] [employer]    PT Start Time 1305    PT Stop Time 1345    PT Time Calculation (min) 40 min    Activity Tolerance Patient tolerated treatment well    Behavior During Therapy [facility] for tasks assessed/performed             Past Medical History:  Diagnosis Date   Anxiety disorder 03/14/2019   Last Assessment & Plan:  Controlled with Lexapro 5 mg PO QD.   Diarrhea 05/01/2017   GERD (gastroesophageal reflux disease)    High blood pressure    Lactose intolerance    Past Surgical History:  Procedure Laterality Date   CHOLECYSTECTOMY     COLONOSCOPY N/A 06/22/2017   Procedure: COLONOSCOPY;  Surgeon: Danie Binder, MD;  Location: [facility];  Service: Endoscopy;  Laterality: N/A;  1:45pm   wisdom teeth extraction     Patient Active Problem List   Diagnosis Date Noted   Dizziness 10/07/2022   Depression 06/26/2022   Essential hypertension 05/13/2022   Mixed hyperlipidemia 05/13/2022   Vitamin D deficiency 03/13/2022   Pre-diabetes 10/02/2020   Class 3 severe obesity with serious comorbidity and body mass index (BMI) of 40.0 to 44.9 in adult [facility]) 10/01/2020   GERD (gastroesophageal reflux disease)    Anxiety disorder 03/14/2019   IUD (intrauterine device) in place 01/13/2018    PCP:   Coral Spikes, DO [place] [facility]   REFERRING PROVIDER:   Coral Spikes, DO [place] [facility]    REFERRING DIAG: R42 (ICD-10-CM) - Dizziness  THERAPY DIAG:  Dizziness and giddiness  ONSET DATE: about a year  Rationale for Evaluation and Treatment: Rehabilitation  SUBJECTIVE:   SUBJECTIVE STATEMENT: Not  sleeping well; wooziness is about the same; " worse when she is tired"; tried Magnesium and that has not changed anything.  Has noticed some increased symptoms with grocery shopping and with increased anxiety.    Eval:Wooziness (like the room is moving; or she is swimming) has been going on for a year or more; then one day at work 09/29/22 had an incident where room was spinning and felt lightheaded.  Got up to go to the bathroom and it got worse.  Went to urgent care.  Diagnosis was dehydration and possible low iron.  Blood work showed that all of that was fine.  Saw PCP and he referred to therapy. Hx of  Chronic ear infections.  Had last bad episode about 1.5 weeks ago; more frequent episodes of feeling woozy. Trouble concentrating when woozy.   Pt accompanied by: self  PERTINENT HISTORY: chronic ear infections; sees [facility]  PAIN:  Are you having pain? No  PRECAUTIONS: None  WEIGHT BEARING RESTRICTIONS: No  FALLS: Has patient fallen in last 6 months? No  PLOF: Independent  PATIENT GOALS: feel   OBJECTIVE:   DIAGNOSTIC FINDINGS:   COGNITION: Overall cognitive status: Within functional limits for tasks assessed   SENSATION: WFL   Cervical ROM:  AROM WNL throughout  Active A/PROM (deg) eval  Flexion   Extension   Right lateral flexion   Left  lateral flexion   Right rotation   Left rotation   (Blank rows = not tested)  STRENGTH: not tested appears wfl throughout    BED MOBILITY:  Sit to supine Complete Independence Supine to sit Complete Independence  TRANSFERS: Assistive device utilized: None  Sit to stand: Complete Independence Stand to sit: Complete Independence Chair to chair: Complete Independence Floor:  not tested  GAIT: Gait pattern: WFL Distance walked: 50 ft Assistive device utilized: None Level of assistance: Complete Independence Comments: no gait abnormalities noted  FUNCTIONAL TESTS:  5 times sit to stand: next visit Dynamic Gait Index: next  visit  PATIENT SURVEYS:  DHI 14  VESTIBULAR ASSESSMENT:  GENERAL OBSERVATION: no glasses   SYMPTOM BEHAVIOR:  Subjective history: see above  Non-Vestibular symptoms:  history of chronic ear infections  Type of dizziness: Imbalance (Disequilibrium), Spinning/Vertigo, Unsteady with head/body turns, Lightheadedness/Faint, and "Swimmyheaded"  Frequency: random  Duration: varies  Aggravating factors: Spontaneous  Relieving factors: no known relieving factors  Progression of symptoms: worse  OCULOMOTOR EXAM:  Ocular Alignment: normal  Ocular ROM: No Limitations  Spontaneous Nystagmus: absent  Gaze-Induced Nystagmus: absent  Smooth Pursuits: intact  Saccades: intact  Convergence/Divergence: not tested     VESTIBULAR - OCULAR REFLEX:   Slow VOR: Positive Right  VOR Cancellation: Comment: not tested  Head-Impulse Test: HIT Right: positive  Dynamic Visual Acuity:  not tested   POSITIONAL TESTING: Right Dix-Hallpike: no nystagmus Left Dix-Hallpike: no nystagmus  MOTION SENSITIVITY:  Motion Sensitivity Quotient Intensity: 0 = none, 1 = Lightheaded, 2 = Mild, 3 = Moderate, 4 = Severe, 5 = Vomiting  Intensity  1. Sitting to supine 0  2. Supine to L side   3. Supine to R side   4. Supine to sitting 2 (lightheaded)  5. L Hallpike-Dix 0  6. Up from L  2(lightheaded)  7. R Hallpike-Dix 0  8. Up from R  2(lightheaded)  9. Sitting, head tipped to L knee   10. Head up from L knee   11. Sitting, head tipped to R knee   12. Head up from R knee   13. Sitting head turns x5 0  14.Sitting head nods x5 0  15. In stance, 180 turn to L  0  16. In stance, 180 turn to R 0    OTHOSTATICS: not tested; performed previously at MD office without issue     VESTIBULAR TREATMENT:                                                                                                   DATE:  11/04/2022 Review of HEP and goals Sitting: Head nods x 10 Head turns x 10 Target x 2 no head turns  just eyes x 10 Gaze stabilization Nose to knee x 5 each  Walking with head turns x 50 ft x 2 ; every 2 steps then every step   10/21/2022  Canalith Repositioning:  none Gaze Adaptation:  x1 Viewing Horizontal: Position: sitting, Time: 2, and Reps: 10 and x1 Viewing Vertical:  Position: 10, Time: x 1, and Reps: 10 Habituation:  Other:   PATIENT EDUCATION: Education details: Patient educated on exam findings, POC, scope of PT, HEP, and what to expect next visit. Person educated: Patient Education method: Explanation, Demonstration, and Handouts Education comprehension: verbalized understanding, returned demonstration, verbal cues required, and tactile cues required  HOME EXERCISE PROGRAM:  Access Code: [email or web address] URL: [email or web address] Date: 11/04/2022 Prepared by: [facility]  Exercises - Seated Gaze Stabilization with Head Rotation  - 2 x daily - 7 x weekly - 2 sets - 10 reps - Seated Gaze Stabilization with Head Nod  - 2 x daily - 7 x weekly - 2 sets - 10 reps - Standing Gaze Stabilization with Head Rotation and Horizontal Arm Movement  - 2 x daily - 7 x weekly - 2 sets - 10 reps - Narrow Stance Gaze Stabilization with Two Near Targets and Head Rotation  - 2 x daily - 7 x weekly - 1 sets - 10 reps - Walking with Head Rotation  - 2 x daily - 7 x weekly - 1 sets - 10 reps  GOALS: Goals reviewed with patient? No  SHORT TERM GOALS: Target date: 11/04/2022  patient will be independent with initial HEP  Baseline: Goal status: IN PROGRESS  2.  Patient will self report 50% improvement to improve tolerance for functional activity  Baseline:  Goal status: IN PROGRESS    LONG TERM GOALS: Target date: 11/18/2022  Patient will be independent in self management strategies to improve quality of life and functional outcomes. Baseline:  Goal status: IN PROGRESS  2.  Patient will self report 75% improvement to improve tolerance for functional  activity  Baseline:  Goal status: IN PROGRESS  3.  Patient will be able to go to the grocery store and shop for weekly groceries without compliant of dizziness Baseline:  Goal status: IN PROGRESS  4.  Patient will improve her DHI score to 8 or less to demonstrate decreased dizziness with functional activity Baseline: 14 Goal status: IN PROGRESS   ASSESSMENT:  CLINICAL IMPRESSION: Today's session started with a review of HEP and goals.  Patient with noted mild improvement head nods today; continues with most issue with head rotation.  Progressed head rotation with increased speed and with  walking.  Added 2 target gaze today with mild increase in symptoms.  Updated HEP.  Encouraged patient to perform 2 times daily to the point of mild dizziness but if significant increased dizziness occurs to discontinue.     Eval:Patient is a 27 y.o. female who was seen today for physical therapy evaluation and treatment for dizziness.  Patient demonstrates increased vestibular symptoms with provocative testing which is negatively impacting patient ability to perform ADLs and functional mobility tasks. Patient will benefit from skilled physical therapy services to address these deficits to improve level of function with ADLs, functional mobility tasks, and reduce risk for falls.    OBJECTIVE IMPAIRMENTS: decreased activity tolerance, dizziness, and impaired perceived functional ability.   ACTIVITY LIMITATIONS: locomotion level and general activities  PARTICIPATION LIMITATIONS: shopping, community activity, and occupation  REHAB POTENTIAL: Good  CLINICAL DECISION MAKING: Evolving/moderate complexity  EVALUATION COMPLEXITY: Moderate   PLAN:  PT FREQUENCY: 1x/week  PT DURATION: 4 weeks  PLANNED INTERVENTIONS: Therapeutic exercises, Therapeutic activity, Neuromuscular re-education, Balance training, Gait training, Patient/Family education, Joint manipulation, Joint mobilization, Stair training,  Orthotic/Fit training, DME instructions, Aquatic Therapy, Dry Needling, Electrical stimulation, Spinal manipulation, Spinal mobilization, Cryotherapy, Moist heat, Compression bandaging, scar mobilization, Splintting, Taping, Traction, Ultrasound, Ionotophoresis 4mg /ml Dexamethasone, and Manual therapy  PLAN FOR NEXT SESSION:  progress gaze stabilization and habituation   1:36 PM, 11/04/22 Aria Pickrell Small Shanieka Blea MPT [place] [facility] [place] [phone]

## 2022-11-07 ENCOUNTER — Other Ambulatory Visit (HOSPITAL_COMMUNITY): Payer: Self-pay

## 2022-11-10 NOTE — Progress Notes (Unsigned)
TeleHealth Visit:  This visit was completed with telemedicine (audio/video) technology. Alexandra Jones has verbally consented to this TeleHealth visit. The patient is located at home, the provider is located at home. The participants in this visit include the listed provider and patient. The visit was conducted today via MyChart video.  OBESITY Alexandra Jones is here to discuss her progress with her obesity treatment plan along with follow-up of her obesity related diagnoses.   Today's visit was # 44 Starting weight: 250 lbs Starting date: 10/01/2020 Weight at last in office visit: 245 lbs on 10/14/22 Total weight loss: 5 lbs at last in office visit on 10/14/22. Today's reported weight (11/11/22):  245 lbs  Nutrition Plan: keeping a food journal with goal of 1200-1400 calories and 90 grams of protein daily  Current exercise:  walking for 20-30 minutes occasionally  Interim History:  She has maintained her weight since her last in office visit.  She is not always journaling the complete day.  Sometimes eating is on plan and sometimes not.  She does better with journaling during the week.  On the weekends she tends to be off plan-having meals with family and her boyfriend. She has noticed her hunger has increased recently. Her coverage for Zepbound ends in May.   Pharmacotherapy: Alexandra Jones is on Zepbound 10 mg SQ weekly Adverse side effects: None Hunger is moderately controlled.  Cravings are moderately controlled.  Assessment/Plan:  We discussed recent lab results in depth.  1. Mixed Hyperlipidemia Lipid panel on 10/14/2022: HDL low at 34, triglycerides elevated at 217, LDL normal at 87. Medication(s): none  Lab Results  Component Value Date   CHOL 158 10/14/2022   HDL 34 (L) 10/14/2022   LDLCALC 87 10/14/2022   TRIG 217 (H) 10/14/2022   CHOLHDL 5.1 (H) 03/18/2019   Lab Results  Component Value Date   ALT 25 10/14/2022   AST 19 10/14/2022   ALKPHOS 54 10/14/2022   BILITOT 0.4 10/14/2022    The ASCVD Risk score (Arnett DK, et al., 2019) failed to calculate for the following reasons:   The 2019 ASCVD risk score is only valid for ages 20 to 27  Plan: Continue to work on weight loss and exercise.  2. Other depression/emotional eating Alexandra Jones has had issues with stress/emotional eating.  She also has tended toward depression over the winter months but is feeling better.  She also has some anxiety issues.  Mood is stable. She is on Lexapro 15 mg daily.  Plan: Refill Lexapro 10 mg daily. Refill Lexapro 5 mg daily  3. Vitamin D Deficiency Vitamin D is not at goal of 50.  Vitamin D level is 29, down from 38 in November.  Possibly due to less sun exposure. She is on  prescription ergocalciferol 50,000 IU weekly. Lab Results  Component Value Date   VD25OH 29.2 (L) 10/14/2022   VD25OH 38.4 06/26/2022   VD25OH 37.1 11/04/2021    Plan: Refill prescription vitamin D 50,000 IU weekly.   4. Morbid Obesity: Current BMI 41 Pharmacotherapy Plan Continue and increase dose  Zepbound 12.5 mg SQ weekly with 1 refill. Hopefully she can get 2 more refills before coverage in. Alexandra Jones is currently in the action stage of change. As such, her goal is to continue with weight loss efforts.  She has agreed to keeping a food journal with goal of 1200-1400 calories and 90 grams of protein daily.  Encouraged her to try to journal 6 days/week.  Exercise goals: Encouraged regular exercise-walking.  Behavioral modification strategies:  increasing lean protein intake, decreasing simple carbohydrates , decrease eating out, and increase frequency of journaling.  Alexandra Jones has agreed to follow-up with our clinic in 4 weeks.   No orders of the defined types were placed in this encounter.   Medications Discontinued During This Encounter  Medication Reason   tirzepatide (ZEPBOUND) 10 MG/0.5ML Pen Dose change   Vitamin D, Ergocalciferol, (DRISDOL) 1.25 MG (50000 UNIT) CAPS capsule Reorder   escitalopram  (LEXAPRO) 10 MG tablet Reorder   escitalopram (LEXAPRO) 5 MG tablet Reorder     Meds ordered this encounter  Medications   tirzepatide (ZEPBOUND) 12.5 MG/0.5ML Pen    Sig: Inject 12.5 mg into the skin once a week.    Dispense:  2 mL    Refill:  1    Order Specific Question:   Supervising Provider    Answer:   Dell Ponto [[id number]]   escitalopram (LEXAPRO) 10 MG tablet    Sig: Take 1 tablet (10 mg total) by mouth daily.    Dispense:  30 tablet    Refill:  0    Order Specific Question:   Supervising Provider    Answer:   Dell Ponto [[id number]]   escitalopram (LEXAPRO) 5 MG tablet    Sig: Take 1 tablet (5 mg total) by mouth daily.    Dispense:  30 tablet    Refill:  0    Order Specific Question:   Supervising Provider    Answer:   Loyal Gambler E [[id number]]   Vitamin D, Ergocalciferol, (DRISDOL) 1.25 MG (50000 UNIT) CAPS capsule    Sig: Take 1 capsule (50,000 Units total) by mouth every 7 (seven) days.    Dispense:  4 capsule    Refill:  0    Order Specific Question:   Supervising Provider    Answer:   Dell Ponto [[id number]]      Objective:   VITALS: Per patient if applicable, see vitals. GENERAL: Alert and in no acute distress. CARDIOPULMONARY: No increased WOB. Speaking in clear sentences.  PSYCH: Pleasant and cooperative. Speech normal rate and rhythm. Affect is appropriate. Insight and judgement are appropriate. Attention is focused, linear, and appropriate.  NEURO: Oriented as arrived to appointment on time with no prompting.   Attestation Statements:   Reviewed by clinician on day of visit: allergies, medications, problem list, medical history, surgical history, family history, social history, and previous encounter notes.   This was prepared with the assistance of Presenter, broadcasting.  Occasional wrong-word or sound-a-like substitutions may have occurred due to the inherent limitations of voice recognition software.

## 2022-11-11 ENCOUNTER — Telehealth (INDEPENDENT_AMBULATORY_CARE_PROVIDER_SITE_OTHER): Payer: Commercial Managed Care - PPO | Admitting: Family Medicine

## 2022-11-11 ENCOUNTER — Encounter (INDEPENDENT_AMBULATORY_CARE_PROVIDER_SITE_OTHER): Payer: Self-pay | Admitting: Family Medicine

## 2022-11-11 ENCOUNTER — Other Ambulatory Visit: Payer: Self-pay

## 2022-11-11 DIAGNOSIS — E782 Mixed hyperlipidemia: Secondary | ICD-10-CM

## 2022-11-11 DIAGNOSIS — E559 Vitamin D deficiency, unspecified: Secondary | ICD-10-CM

## 2022-11-11 DIAGNOSIS — Z6841 Body Mass Index (BMI) 40.0 and over, adult: Secondary | ICD-10-CM

## 2022-11-11 DIAGNOSIS — F3289 Other specified depressive episodes: Secondary | ICD-10-CM

## 2022-11-11 MED ORDER — ESCITALOPRAM OXALATE 10 MG PO TABS
10.0000 mg | ORAL_TABLET | Freq: Every day | ORAL | 0 refills | Status: DC
  Filled 2022-11-11: qty 30, 30d supply, fill #0

## 2022-11-11 MED ORDER — ZEPBOUND 12.5 MG/0.5ML ~~LOC~~ SOAJ
12.5000 mg | SUBCUTANEOUS | 1 refills | Status: DC
  Filled 2022-11-11 (×2): qty 2, 28d supply, fill #0
  Filled 2022-12-04: qty 2, 28d supply, fill #1

## 2022-11-11 MED ORDER — VITAMIN D (ERGOCALCIFEROL) 1.25 MG (50000 UNIT) PO CAPS
50000.0000 [IU] | ORAL_CAPSULE | ORAL | 0 refills | Status: DC
  Filled 2022-11-11: qty 4, 28d supply, fill #0

## 2022-11-11 MED ORDER — ESCITALOPRAM OXALATE 5 MG PO TABS
5.0000 mg | ORAL_TABLET | Freq: Every day | ORAL | 0 refills | Status: DC
  Filled 2022-11-11: qty 30, 30d supply, fill #0

## 2022-11-13 ENCOUNTER — Ambulatory Visit (HOSPITAL_COMMUNITY): Payer: Commercial Managed Care - PPO | Attending: Family Medicine

## 2022-11-13 DIAGNOSIS — R42 Dizziness and giddiness: Secondary | ICD-10-CM | POA: Insufficient documentation

## 2022-11-13 NOTE — Therapy (Signed)
OUTPATIENT PHYSICAL THERAPY VESTIBULAR TREATMENT     Patient Name: Alexandra Jones MRN: [id number] DOB:22-Dec-1995, 27 y.o., female Today's Date: 11/13/2022  END OF SESSION:  PT End of Session - 11/13/22 1307     Visit Number 3    Number of Visits 4    Date for PT Re-Evaluation 11/18/22    Authorization Type [facility] [employer]    PT Start Time 1305    PT Stop Time 1345    PT Time Calculation (min) 40 min    Activity Tolerance Patient tolerated treatment well    Behavior During Therapy [facility] for tasks assessed/performed             Past Medical History:  Diagnosis Date   Anxiety disorder 03/14/2019   Last Assessment & Plan:  Controlled with Lexapro 5 mg PO QD.   Diarrhea 05/01/2017   GERD (gastroesophageal reflux disease)    High blood pressure    Lactose intolerance    Past Surgical History:  Procedure Laterality Date   CHOLECYSTECTOMY     COLONOSCOPY N/A 06/22/2017   Procedure: COLONOSCOPY;  Surgeon: Danie Binder, MD;  [facility];  Service: Endoscopy;  Laterality: N/A;  1:45pm   wisdom teeth extraction     Patient Active Problem List   Diagnosis Date Noted   Dizziness 10/07/2022   Depression 06/26/2022   Essential hypertension 05/13/2022   Mixed hyperlipidemia 05/13/2022   Vitamin D deficiency 03/13/2022   Pre-diabetes 10/02/2020   Class 3 severe obesity with serious comorbidity and body mass index (BMI) of 40.0 to 44.9 in adult 10/01/2020   GERD (gastroesophageal reflux disease)    Anxiety disorder 03/14/2019   IUD (intrauterine device) in place 01/13/2018    PCP:   Coral Spikes, DO [place] [facility]   REFERRING PROVIDER:   Coral Spikes, DO [place] [facility]    REFERRING DIAG: R42 (ICD-10-CM) - Dizziness  THERAPY DIAG:  Dizziness and giddiness  ONSET DATE: about a year  Rationale for Evaluation and Treatment: Rehabilitation  SUBJECTIVE:   SUBJECTIVE STATEMENT: Her horse  kicked her on Monday and so she is sore from that left arm and right foot and left shin so has not been sleeping so good.  Maybe a little better with the swimmy headed feeling; less frequent not daily anymore.    Eval:Wooziness (like the room is moving; or she is swimming) has been going on for a year or more; then one day at work 09/29/22 had an incident where room was spinning and felt lightheaded.  Got up to go to the bathroom and it got worse.  Went to urgent care.  Diagnosis was dehydration and possible low iron.  Blood work showed that all of that was fine.  Saw PCP and he referred to therapy. Hx of  Chronic ear infections.  Had last bad episode about 1.5 weeks ago; more frequent episodes of feeling woozy. Trouble concentrating when woozy.   Pt accompanied by: self  PERTINENT HISTORY: chronic ear infections; sees Teoh  PAIN:  Are you having pain? No  PRECAUTIONS: None  WEIGHT BEARING RESTRICTIONS: No  FALLS: Has patient fallen in last 6 months? No  PLOF: Independent  PATIENT GOALS: feel   OBJECTIVE:   DIAGNOSTIC FINDINGS:   COGNITION: Overall cognitive status: Within functional limits for tasks assessed   SENSATION: WFL   Cervical ROM:  AROM WNL throughout  Active A/PROM (deg) eval  Flexion   Extension  Right lateral flexion   Left lateral flexion   Right rotation   Left rotation   (Blank rows = not tested)  STRENGTH: not tested appears wfl throughout    BED MOBILITY:  Sit to supine Complete Independence Supine to sit Complete Independence  TRANSFERS: Assistive device utilized: None  Sit to stand: Complete Independence Stand to sit: Complete Independence Chair to chair: Complete Independence Floor:  not tested  GAIT: Gait pattern: WFL Distance walked: 50 ft Assistive device utilized: None Level of assistance: Complete Independence Comments: no gait abnormalities noted  FUNCTIONAL TESTS:  5 times sit to stand: next visit Dynamic Gait Index: next  visit  PATIENT SURVEYS:  DHI 14  VESTIBULAR ASSESSMENT:  GENERAL OBSERVATION: no glasses   SYMPTOM BEHAVIOR:  Subjective history: see above  Non-Vestibular symptoms:  history of chronic ear infections  Type of dizziness: Imbalance (Disequilibrium), Spinning/Vertigo, Unsteady with head/body turns, Lightheadedness/Faint, and "Swimmyheaded"  Frequency: random  Duration: varies  Aggravating factors: Spontaneous  Relieving factors: no known relieving factors  Progression of symptoms: worse  OCULOMOTOR EXAM:  Ocular Alignment: normal  Ocular ROM: No Limitations  Spontaneous Nystagmus: absent  Gaze-Induced Nystagmus: absent  Smooth Pursuits: intact  Saccades: intact  Convergence/Divergence: not tested     VESTIBULAR - OCULAR REFLEX:   Slow VOR: Positive Right  VOR Cancellation: Comment: not tested  Head-Impulse Test: HIT Right: positive  Dynamic Visual Acuity:  not tested   POSITIONAL TESTING: Right Dix-Hallpike: no nystagmus Left Dix-Hallpike: no nystagmus  MOTION SENSITIVITY:  Motion Sensitivity Quotient Intensity: 0 = none, 1 = Lightheaded, 2 = Mild, 3 = Moderate, 4 = Severe, 5 = Vomiting  Intensity  1. Sitting to supine 0  2. Supine to L side   3. Supine to R side   4. Supine to sitting 2 (lightheaded)  5. L Hallpike-Dix 0  6. Up from L  2(lightheaded)  7. R Hallpike-Dix 0  8. Up from R  2(lightheaded)  9. Sitting, head tipped to L knee   10. Head up from L knee   11. Sitting, head tipped to R knee   12. Head up from R knee   13. Sitting head turns x5 0  14.Sitting head nods x5 0  15. In stance, 180 turn to L  0  16. In stance, 180 turn to R 0    OTHOSTATICS: not tested; performed previously at MD office without issue     VESTIBULAR TREATMENT:                                                                                                   DATE:  11/13/22 Sitting: Head nods and turns x 20 each Saccades 2 target x 20 vertical and horizontal Seated  saccades with animals, shapes and number cards x 2 reading rows alternating cards  11/04/2022 Review of HEP and goals Sitting: Head nods x 10 Head turns x 10 Target x 2 no head turns just eyes x 10 Gaze stabilization Nose to knee x 5 each  Walking with head turns x 50 ft x 2 ; every 2 steps  then every step   10/21/2022  Canalith Repositioning:  none Gaze Adaptation:  x1 Viewing Horizontal: Position: sitting, Time: 2, and Reps: 10 and x1 Viewing Vertical:  Position: 10, Time: x 1, and Reps: 10 Habituation:   Other:   PATIENT EDUCATION: Education details: Patient educated on exam findings, POC, scope of PT, HEP, and what to expect next visit. Person educated: Patient Education method: Explanation, Demonstration, and Handouts Education comprehension: verbalized understanding, returned demonstration, verbal cues required, and tactile cues required  HOME EXERCISE PROGRAM:  Access Code: [email or web address] URL: [email or web address] Date: 11/04/2022 Prepared by: [facility]  Exercises - Seated Gaze Stabilization with Head Rotation  - 2 x daily - 7 x weekly - 2 sets - 10 reps - Seated Gaze Stabilization with Head Nod  - 2 x daily - 7 x weekly - 2 sets - 10 reps - Standing Gaze Stabilization with Head Rotation and Horizontal Arm Movement  - 2 x daily - 7 x weekly - 2 sets - 10 reps - Narrow Stance Gaze Stabilization with Two Near Targets and Head Rotation  - 2 x daily - 7 x weekly - 1 sets - 10 reps - Walking with Head Rotation  - 2 x daily - 7 x weekly - 1 sets - 10 reps  GOALS: Goals reviewed with patient? No  SHORT TERM GOALS: Target date: 11/04/2022  patient will be independent with initial HEP  Baseline: Goal status: IN PROGRESS  2.  Patient will self report 50% improvement to improve tolerance for functional activity  Baseline:  Goal status: IN PROGRESS    LONG TERM GOALS: Target date: 11/18/2022  Patient will be independent in self management strategies  to improve quality of life and functional outcomes. Baseline:  Goal status: IN PROGRESS  2.  Patient will self report 75% improvement to improve tolerance for functional activity  Baseline:  Goal status: IN PROGRESS  3.  Patient will be able to go to the grocery store and shop for weekly groceries without compliant of dizziness Baseline:  Goal status: IN PROGRESS  4.  Patient will improve her DHI score to 8 or less to demonstrate decreased dizziness with functional activity Baseline: 14 Goal status: IN PROGRESS   ASSESSMENT:  CLINICAL IMPRESSION: Today's session continued to work on gaze stabilization and VOR.  Added saccades with shapes and heard turns with shapes to HEP.  Encouraged patient to perform 2 times daily to the point of mild dizziness but if significant increased dizziness occurs to discontinue.  Noted overall decreased complaint of dizziness with head turns today.  Patient will benefit from continued skilled therapy services  to address deficits and promote return to optimal function.       Eval:Patient is a 27 y.o. female who was seen today for physical therapy evaluation and treatment for dizziness.  Patient demonstrates increased vestibular symptoms with provocative testing which is negatively impacting patient ability to perform ADLs and functional mobility tasks. Patient will benefit from skilled physical therapy services to address these deficits to improve level of function with ADLs, functional mobility tasks, and reduce risk for falls.    OBJECTIVE IMPAIRMENTS: decreased activity tolerance, dizziness, and impaired perceived functional ability.   ACTIVITY LIMITATIONS: locomotion level and general activities  PARTICIPATION LIMITATIONS: shopping, community activity, and occupation  REHAB POTENTIAL: Good  CLINICAL DECISION MAKING: Evolving/moderate complexity  EVALUATION COMPLEXITY: Moderate   PLAN:  PT FREQUENCY: 1x/week  PT DURATION: 4  weeks  PLANNED INTERVENTIONS: Therapeutic exercises, Therapeutic activity, Neuromuscular re-education,  Balance training, Gait training, Patient/Family education, Joint manipulation, Joint mobilization, Stair training, Orthotic/Fit training, DME instructions, Aquatic Therapy, Dry Needling, Electrical stimulation, Spinal manipulation, Spinal mobilization, Cryotherapy, Moist heat, Compression bandaging, scar mobilization, Splintting, Taping, Traction, Ultrasound, Ionotophoresis 4mg /ml Dexamethasone, and Manual therapy   PLAN FOR NEXT SESSION:  progress gaze stabilization and habituation   1:44 PM, 11/13/22 Dalphine Cowie Small Fountain Derusha MPT [place] [facility] [place] [phone] [phone]

## 2022-11-18 ENCOUNTER — Ambulatory Visit (HOSPITAL_COMMUNITY): Payer: Commercial Managed Care - PPO

## 2022-11-18 DIAGNOSIS — R42 Dizziness and giddiness: Secondary | ICD-10-CM

## 2022-11-18 NOTE — Therapy (Signed)
OUTPATIENT PHYSICAL THERAPY VESTIBULAR TREATMENT/ DISCHARGE  PHYSICAL THERAPY DISCHARGE SUMMARY  Visits from Start of Care: 4  Current functional level related to goals / functional outcomes: See below   Remaining deficits: See below   Education / Equipment: See below   Patient agrees to discharge. Patient goals were partially met. Patient is being discharged due to being pleased with the current functional level.      Patient Name: Alexandra Jones MRN: [id number] DOB:Jan 28, 1996, 27 y.o., female Today's Date: 11/18/2022  END OF SESSION:  PT End of Session - 11/18/22 1308     Visit Number 4    Number of Visits 4    Date for PT Re-Evaluation 11/18/22    Authorization Type [facility] [employer]    PT Start Time 1305    PT Stop Time 1340    PT Time Calculation (min) 35 min    Activity Tolerance Patient tolerated treatment well    Behavior During Therapy [facility] for tasks assessed/performed             Past Medical History:  Diagnosis Date   Anxiety disorder 03/14/2019   Last Assessment & Plan:  Controlled with Lexapro 5 mg PO QD.   Diarrhea 05/01/2017   GERD (gastroesophageal reflux disease)    High blood pressure    Lactose intolerance    Past Surgical History:  Procedure Laterality Date   CHOLECYSTECTOMY     COLONOSCOPY N/A 06/22/2017   Procedure: COLONOSCOPY;  Surgeon: [place] Bali, MD;  [facility];  Service: Endoscopy;  Laterality: N/A;  1:45pm   wisdom teeth extraction     Patient Active Problem List   Diagnosis Date Noted   Dizziness 10/07/2022   Depression 06/26/2022   Essential hypertension 05/13/2022   Mixed hyperlipidemia 05/13/2022   Vitamin D deficiency 03/13/2022   Pre-diabetes 10/02/2020   Class 3 severe obesity with serious comorbidity and body mass index (BMI) of 40.0 to 44.9 in adult 10/01/2020   GERD (gastroesophageal reflux disease)    Anxiety disorder 03/14/2019   IUD (intrauterine device) in place 01/13/2018    PCP:   Tommie Sams, DO [place] [facility]   REFERRING PROVIDER:   Tommie Sams, DO [place] [facility]    REFERRING DIAG: R42 (ICD-10-CM) - Dizziness  THERAPY DIAG:  Dizziness and giddiness  ONSET DATE: about a year  Rationale for Evaluation and Treatment: Rehabilitation  SUBJECTIVE:   SUBJECTIVE STATEMENT: "90% better; can tell that when I'm tired or stressed it's worse"; reports some cervical tightness today  Eval:Wooziness (like the room is moving; or she is swimming) has been going on for a year or more; then one day at work 09/29/22 had an incident where room was spinning and felt lightheaded.  Got up to go to the bathroom and it got worse.  Went to urgent care.  Diagnosis was dehydration and possible low iron.  Blood work showed that all of that was fine.  Saw PCP and he referred to therapy. Hx of  Chronic ear infections.  Had last bad episode about 1.5 weeks ago; more frequent episodes of feeling woozy. Trouble concentrating when woozy.   Pt accompanied by: self  PERTINENT HISTORY: chronic ear infections; sees Teoh  PAIN:  Are you having pain? No  PRECAUTIONS: None  WEIGHT BEARING RESTRICTIONS: No  FALLS: Has patient fallen in last 6 months? No  PLOF: Independent  PATIENT GOALS: feel   OBJECTIVE:   DIAGNOSTIC FINDINGS:  COGNITION: Overall cognitive status: Within functional limits for tasks assessed   SENSATION: WFL   Cervical ROM:  AROM WNL throughout  Active A/PROM (deg) eval  Flexion   Extension   Right lateral flexion   Left lateral flexion   Right rotation   Left rotation   (Blank rows = not tested)  STRENGTH: not tested appears wfl throughout    BED MOBILITY:  Sit to supine Complete Independence Supine to sit Complete Independence  TRANSFERS: Assistive device utilized: None  Sit to stand: Complete Independence Stand to sit: Complete Independence Chair to chair: Complete  Independence Floor:  not tested  GAIT: Gait pattern: WFL Distance walked: 50 ft Assistive device utilized: None Level of assistance: Complete Independence Comments: no gait abnormalities noted  FUNCTIONAL TESTS:  5 times sit to stand: next visit Dynamic Gait Index: next visit  PATIENT SURVEYS:  DHI 14  VESTIBULAR ASSESSMENT:  GENERAL OBSERVATION: no glasses   SYMPTOM BEHAVIOR:  Subjective history: see above  Non-Vestibular symptoms:  history of chronic ear infections  Type of dizziness: Imbalance (Disequilibrium), Spinning/Vertigo, Unsteady with head/body turns, Lightheadedness/Faint, and "Swimmyheaded"  Frequency: random  Duration: varies  Aggravating factors: Spontaneous  Relieving factors: no known relieving factors  Progression of symptoms: worse  OCULOMOTOR EXAM:  Ocular Alignment: normal  Ocular ROM: No Limitations  Spontaneous Nystagmus: absent  Gaze-Induced Nystagmus: absent  Smooth Pursuits: intact  Saccades: intact  Convergence/Divergence: not tested     VESTIBULAR - OCULAR REFLEX:   Slow VOR: Positive Right  VOR Cancellation: Comment: not tested  Head-Impulse Test: HIT Right: positive  Dynamic Visual Acuity:  not tested   POSITIONAL TESTING: Right Dix-Hallpike: no nystagmus Left Dix-Hallpike: no nystagmus  MOTION SENSITIVITY:  Motion Sensitivity Quotient Intensity: 0 = none, 1 = Lightheaded, 2 = Mild, 3 = Moderate, 4 = Severe, 5 = Vomiting  Intensity  1. Sitting to supine 0  2. Supine to L side   3. Supine to R side   4. Supine to sitting 2 (lightheaded)  5. L Hallpike-Dix 0  6. Up from L  2(lightheaded)  7. R Hallpike-Dix 0  8. Up from R  2(lightheaded)  9. Sitting, head tipped to L knee   10. Head up from L knee   11. Sitting, head tipped to R knee   12. Head up from R knee   13. Sitting head turns x5 0  14.Sitting head nods x5 0  15. In stance, 180 turn to L  0  16. In stance, 180 turn to R 0    OTHOSTATICS: not tested; performed  previously at MD office without issue     VESTIBULAR TREATMENT:                                                                                                   DATE:  11/18/22 Progress note DHI 12 Review of HEP STM to cervical spine to decrease pain and increase tissue extensibility x 15' no other treatment performed during manual treatment.     11/13/22 Sitting: Head nods and turns x 20 each Saccades 2 target x  20 vertical and horizontal Seated saccades with animals, shapes and number cards x 2 reading rows alternating cards  11/04/2022 Review of HEP and goals Sitting: Head nods x 10 Head turns x 10 Target x 2 no head turns just eyes x 10 Gaze stabilization Nose to knee x 5 each  Walking with head turns x 50 ft x 2 ; every 2 steps then every step   10/21/2022  Canalith Repositioning:  none Gaze Adaptation:  x1 Viewing Horizontal: Position: sitting, Time: 2, and Reps: 10 and x1 Viewing Vertical:  Position: 10, Time: x 1, and Reps: 10 Habituation:   Other:   PATIENT EDUCATION: Education details: Patient educated on exam findings, POC, scope of PT, HEP, and what to expect next visit. Person educated: Patient Education method: Explanation, Demonstration, and Handouts Education comprehension: verbalized understanding, returned demonstration, verbal cues required, and tactile cues required  HOME EXERCISE PROGRAM:  Access Code: [email or web address] URL: [email or web address] Date: 11/04/2022 Prepared by: [facility]  Exercises - Seated Gaze Stabilization with Head Rotation  - 2 x daily - 7 x weekly - 2 sets - 10 reps - Seated Gaze Stabilization with Head Nod  - 2 x daily - 7 x weekly - 2 sets - 10 reps - Standing Gaze Stabilization with Head Rotation and Horizontal Arm Movement  - 2 x daily - 7 x weekly - 2 sets - 10 reps - Narrow Stance Gaze Stabilization with Two Near Targets and Head Rotation  - 2 x daily - 7 x weekly - 1 sets - 10 reps - Walking with Head  Rotation  - 2 x daily - 7 x weekly - 1 sets - 10 reps  GOALS: Goals reviewed with patient? No  SHORT TERM GOALS: Target date: 11/04/2022  patient will be independent with initial HEP  Baseline: Goal status: MET  2.  Patient will self report 50% improvement to improve tolerance for functional activity  Baseline:  Goal status: MET    LONG TERM GOALS: Target date: 11/18/2022  Patient will be independent in self management strategies to improve quality of life and functional outcomes. Baseline:  Goal status: MET  2.  Patient will self report 75% improvement to improve tolerance for functional activity  Baseline:  Goal status: MET  3.  Patient will be able to go to the grocery store and shop for weekly groceries without compliant of dizziness Baseline:  "not as bad but still get somewhat dizzy" Goal status: IN PROGRESS  4.  Patient will improve her DHI score to 8 or less to demonstrate decreased dizziness with functional activity Baseline: 14; 11/18/22 12 Goal status: IN PROGRESS   ASSESSMENT:  CLINICAL IMPRESSION: Progress note today.    4/6 set rehab goals achieved. Patient with good understanding of HEP so will discharge to I HEP at this time; patient agreeable to discharge.     Eval:Patient is a 27 y.o. female who was seen today for physical therapy evaluation and treatment for dizziness.  Patient demonstrates increased vestibular symptoms with provocative testing which is negatively impacting patient ability to perform ADLs and functional mobility tasks. Patient will benefit from skilled physical therapy services to address these deficits to improve level of function with ADLs, functional mobility tasks, and reduce risk for falls.    OBJECTIVE IMPAIRMENTS: decreased activity tolerance, dizziness, and impaired perceived functional ability.   ACTIVITY LIMITATIONS: locomotion level and general activities  PARTICIPATION LIMITATIONS: shopping, community activity, and  occupation  REHAB POTENTIAL: Good  CLINICAL DECISION MAKING: Evolving/moderate  complexity  EVALUATION COMPLEXITY: Moderate   PLAN:  PT FREQUENCY: 1x/week  PT DURATION: 4 weeks  PLANNED INTERVENTIONS: Therapeutic exercises, Therapeutic activity, Neuromuscular re-education, Balance training, Gait training, Patient/Family education, Joint manipulation, Joint mobilization, Stair training, Orthotic/Fit training, DME instructions, Aquatic Therapy, Dry Needling, Electrical stimulation, Spinal manipulation, Spinal mobilization, Cryotherapy, Moist heat, Compression bandaging, scar mobilization, Splintting, Taping, Traction, Ultrasound, Ionotophoresis 4mg /ml Dexamethasone, and Manual therapy   PLAN FOR NEXT SESSION:  discharge   1:44 PM, 11/18/22 Quade Ramirez Small Bernarda Erck MPT [place] [facility] [place] [phone] Ph:[phone]

## 2022-12-04 ENCOUNTER — Other Ambulatory Visit (HOSPITAL_COMMUNITY): Payer: Self-pay

## 2022-12-09 ENCOUNTER — Other Ambulatory Visit (HOSPITAL_COMMUNITY): Payer: Self-pay

## 2022-12-09 ENCOUNTER — Encounter (INDEPENDENT_AMBULATORY_CARE_PROVIDER_SITE_OTHER): Payer: Self-pay | Admitting: Family Medicine

## 2022-12-09 ENCOUNTER — Ambulatory Visit (INDEPENDENT_AMBULATORY_CARE_PROVIDER_SITE_OTHER): Payer: Commercial Managed Care - PPO | Admitting: Family Medicine

## 2022-12-09 ENCOUNTER — Other Ambulatory Visit: Payer: Self-pay

## 2022-12-09 VITALS — BP 106/71 | HR 71 | Temp 97.8°F | Ht 65.0 in | Wt 240.0 lb

## 2022-12-09 DIAGNOSIS — E559 Vitamin D deficiency, unspecified: Secondary | ICD-10-CM

## 2022-12-09 DIAGNOSIS — E88819 Insulin resistance, unspecified: Secondary | ICD-10-CM | POA: Insufficient documentation

## 2022-12-09 DIAGNOSIS — E669 Obesity, unspecified: Secondary | ICD-10-CM

## 2022-12-09 DIAGNOSIS — Z6841 Body Mass Index (BMI) 40.0 and over, adult: Secondary | ICD-10-CM | POA: Diagnosis not present

## 2022-12-09 DIAGNOSIS — F3289 Other specified depressive episodes: Secondary | ICD-10-CM | POA: Diagnosis not present

## 2022-12-09 MED ORDER — VITAMIN D (ERGOCALCIFEROL) 1.25 MG (50000 UNIT) PO CAPS
50000.0000 [IU] | ORAL_CAPSULE | ORAL | 0 refills | Status: DC
  Filled 2022-12-09: qty 4, 28d supply, fill #0

## 2022-12-09 MED ORDER — ESCITALOPRAM OXALATE 5 MG PO TABS
5.0000 mg | ORAL_TABLET | Freq: Every day | ORAL | 0 refills | Status: DC
  Filled 2022-12-09: qty 30, 30d supply, fill #0

## 2022-12-09 MED ORDER — METFORMIN HCL 500 MG PO TABS
500.0000 mg | ORAL_TABLET | Freq: Every day | ORAL | 0 refills | Status: DC
  Filled 2022-12-09: qty 30, 30d supply, fill #0

## 2022-12-09 MED ORDER — ESCITALOPRAM OXALATE 10 MG PO TABS
10.0000 mg | ORAL_TABLET | Freq: Every day | ORAL | 0 refills | Status: DC
  Filled 2022-12-09: qty 30, 30d supply, fill #0

## 2022-12-10 NOTE — Progress Notes (Unsigned)
Chief Complaint:   OBESITY Alexandra Jones is here to discuss her progress with her obesity treatment plan along with follow-up of her obesity related diagnoses. Alexandra Jones is on keeping a food journal and adhering to recommended goals of 1200-1400 calories and 90+ grams of protein and states she is following her eating plan approximately 65% of the time. Alexandra Jones states she is doing 0 minutes 0 times per week.  Today's visit was #: 34 Starting weight: 250 lbs Starting date: 10/01/2020 Today's weight: 240 lbs Today's date: 12/09/2022 Total lbs lost to date: 10 Total lbs lost since last in-office visit: 5  Interim History: Alexandra Jones has done well with her weight loss. She is working on meeting her protein goals as usual, but she has found this to be more difficult this past month.   Subjective:   1. Vitamin D deficiency Alexandra Jones is on Vitamin D prescription. Her last level was below goal, and she notes fatigue.   2. Insulin resistance Alexandra Jones has been on Alexandra Jones, but her insurance will not cover anymore. She has been on metformin in the past with no nausea or vomiting.   3. Emotional Eating Behavior Alexandra Jones is working on increasing emotional eating behaviors, and she has done well overall. She finds a 15 mg dose seems to work best.   Assessment/Plan:   1. Vitamin D deficiency Alexandra Jones will continue prescription Vitamin D, and we will refill for 1 month.   - Vitamin D, Ergocalciferol, (DRISDOL) 1.25 MG (50000 UNIT) CAPS capsule; Take 1 capsule (50,000 Units total) by mouth every 7 (seven) days.  Dispense: 4 capsule; Refill: 0  2. Insulin resistance Alexandra Jones agreed to start metformin 500 mg q daily with no refills. She is taper off Alexandra Jones over the next 1-2 months and we will follow. She was educated about how metformin increases fertility  and to take this into consideration when choosing birthcontrol and family planning.   - metFORMIN (GLUCOPHAGE) 500 MG tablet; Take 1 tablet (500 mg total) by mouth daily  with breakfast.  Dispense: 30 tablet; Refill: 0  3. Emotional Eating Behavior We will refill Lexapro 5 mg and Lexapro 10 mg for 1 month.   - escitalopram (LEXAPRO) 10 MG tablet; Take 1 tablet (10 mg total) by mouth daily.  Dispense: 30 tablet; Refill: 0 - escitalopram (LEXAPRO) 5 MG tablet; Take 1 tablet (5 mg total) by mouth daily.  Dispense: 30 tablet; Refill: 0  4. BMI 40.7  5. Obesity, Beginning BMI 41.60 Alexandra Jones is currently in the action stage of change. As such, her goal is to continue with weight loss efforts. She has agreed to keeping a food journal and adhering to recommended goals of 1200-1400 calories and 90+ grams of protein daily.   Various ways to increase protein with less meat was discussed.   Behavioral modification strategies: increasing lean protein intake and meal planning and cooking strategies.  Alexandra Jones has agreed to follow-up with our clinic in 4 weeks. She was informed of the importance of frequent follow-up visits to maximize her success with intensive lifestyle modifications for her multiple health conditions.   Objective:   Blood pressure 106/71, pulse 71, temperature 97.8 F (36.6 C), height 5\' 5"  (1.651 m), weight 240 lb (108.9 kg), SpO2 98 %. Body mass index is 39.94 kg/m.  Lab Results  Component Value Date   CREATININE 0.74 10/14/2022   BUN 8 10/14/2022   NA 141 10/14/2022   K 4.1 10/14/2022   CL 102 10/14/2022   CO2 22  10/14/2022   Lab Results  Component Value Date   ALT 25 10/14/2022   AST 19 10/14/2022   ALKPHOS 54 10/14/2022   BILITOT 0.4 10/14/2022   Lab Results  Component Value Date   HGBA1C 5.6 10/14/2022   HGBA1C 5.5 06/26/2022   HGBA1C 5.5 11/04/2021   HGBA1C 5.6 06/24/2021   HGBA1C 5.5 02/26/2021   Lab Results  Component Value Date   INSULIN 24.9 10/14/2022   INSULIN 31.1 (H) 06/26/2022   INSULIN 22.1 11/04/2021   INSULIN 14.5 06/24/2021   INSULIN 25.5 (H) 02/26/2021   Lab Results  Component Value Date   TSH 1.770  09/30/2022   Lab Results  Component Value Date   CHOL 158 10/14/2022   HDL 34 (L) 10/14/2022   LDLCALC 87 10/14/2022   TRIG 217 (H) 10/14/2022   CHOLHDL 5.1 (H) 03/18/2019   Lab Results  Component Value Date   VD25OH 29.2 (L) 10/14/2022   VD25OH 38.4 06/26/2022   VD25OH 37.1 11/04/2021   Lab Results  Component Value Date   WBC 7.3 09/30/2022   HGB 14.8 09/30/2022   HCT 44.3 09/30/2022   MCV 88 09/30/2022   PLT 269 09/30/2022   No results found for: "IRON", "TIBC", "FERRITIN"  Attestation Statements:   Reviewed by clinician on day of visit: allergies, medications, problem list, medical history, surgical history, family history, social history, and previous encounter notes.   I, Burt Knack, am acting as transcriptionist for Quillian Quince, MD.  I have reviewed the above documentation for accuracy and completeness, and I agree with the above. -  Quillian Quince, MD

## 2023-01-01 NOTE — Progress Notes (Signed)
TeleHealth Visit:  This visit was completed with telemedicine (audio/video) technology. Alexandra Jones has verbally consented to this TeleHealth visit. The patient is located at home, the provider is located at home. The participants in this visit include the listed provider and patient. The visit was conducted today via MyChart video.  OBESITY Alexandra Jones is here to discuss her progress with her obesity treatment plan along with follow-up of her obesity related diagnoses.   Today's visit was # 35 Starting weight: 250 lbs Starting date: 10/01/2020 Weight at last in office visit: 240 lbs on 12/09/22 Total weight loss: 10 lbs at last in office visit on 12/09/22. Today's reported weight (01/06/23): none reported  Nutrition Plan: keeping a food journal with goal of 1200-1400 calories and 90 grams of protein daily - 65-75% adherence.  Current exercise:  Horseback riding 60 minutes 3 times weekly.  Interim History:  She took the last dose of her Zepbound a few weeks ago.  So far does not notice a lot of hunger or cravings. Journaling 65-75 % of time.  Protein is low. She averages 60-70 gms/day. She has been light at dinner and breakfast on protein. Eating out dinner- fast food. Denies intake of sugar sweetened beverages.  Assessment/Plan:  1. Emotional eating Asjah has had issues with stress/emotional eating. So far she has not experienced increased emotional eating since stopping Zepbound but she has started noticing a difference in her food cravings. She is on Lexapro 15 mg daily.  Mood is stable.  Plan: Refill Lexapro 5 mg daily. Refill Lexapro 10 mg daily. Consider bupropion if needed.   2. Insulin Resistance Last fasting insulin was 24.9 on 10/14/2022.. A1c was 5.6. Medication(s): Metformin 500 mg once daily breakfast.  Started back on metformin last visit.  Tolerating well. Lab Results  Component Value Date   HGBA1C 5.6 10/14/2022   HGBA1C 5.5 06/26/2022   HGBA1C 5.5 11/04/2021    HGBA1C 5.6 06/24/2021   HGBA1C 5.5 02/26/2021   Lab Results  Component Value Date   INSULIN 24.9 10/14/2022   INSULIN 31.1 (H) 06/26/2022   INSULIN 22.1 11/04/2021   INSULIN 14.5 06/24/2021   INSULIN 25.5 (H) 02/26/2021    Plan: Continue and refill Metformin 500 mg once daily breakfast Check labs in 2 months.  3. Vitamin D Deficiency Vitamin D is not at goal of 50.  Most recent vitamin D level was low at 29.2.Marland Kitchen She is on  prescription ergocalciferol 50,000 IU weekly. Lab Results  Component Value Date   VD25OH 29.2 (L) 10/14/2022   VD25OH 38.4 06/26/2022   VD25OH 37.1 11/04/2021    Plan: Continue and refill  prescription ergocalciferol 50,000 IU weekly   4. Morbid Obesity: Current BMI 39 Alexandra Jones is currently in the action stage of change. As such, her goal is to continue with weight loss efforts.  She has agreed to keeping a food journal with goal of 1200-1400 calories and 90 grams of protein daily.  1.  Increase frequency of journaling. 2.  Use eating out guide when getting fast food. 3.  Increase meal prep at home.  Exercise goals: Continue horseback riding.  Encouraged walking even if it is only once weekly.  Behavioral modification strategies: increasing lean protein intake, decreasing simple carbohydrates , decrease eating out, meal planning , increase water intake, and planning for success.  Alexandra Jones has agreed to follow-up with our clinic in 4 weeks.  No orders of the defined types were placed in this encounter.   Medications Discontinued During This Encounter  Medication Reason   escitalopram (LEXAPRO) 10 MG tablet Reorder   escitalopram (LEXAPRO) 5 MG tablet Reorder   Vitamin D, Ergocalciferol, (DRISDOL) 1.25 MG (50000 UNIT) CAPS capsule Reorder   metFORMIN (GLUCOPHAGE) 500 MG tablet Reorder     Meds ordered this encounter  Medications   escitalopram (LEXAPRO) 10 MG tablet    Sig: Take 1 tablet (10 mg total) by mouth daily.    Dispense:  30 tablet     Refill:  0    Order Specific Question:   Supervising Provider    Answer:   Glennis Brink [[id number]]   escitalopram (LEXAPRO) 5 MG tablet    Sig: Take 1 tablet (5 mg total) by mouth daily.    Dispense:  30 tablet    Refill:  0    Order Specific Question:   Supervising Provider    Answer:   Seymour Bars E [[id number]]   Vitamin D, Ergocalciferol, (DRISDOL) 1.25 MG (50000 UNIT) CAPS capsule    Sig: Take 1 capsule (50,000 Units total) by mouth every 7 (seven) days.    Dispense:  4 capsule    Refill:  0    Order Specific Question:   Supervising Provider    Answer:   Carolin Sicks   metFORMIN (GLUCOPHAGE) 500 MG tablet    Sig: Take 1 tablet (500 mg total) by mouth daily with breakfast.    Dispense:  30 tablet    Refill:  0    Order Specific Question:   Supervising Provider    Answer:   Glennis Brink [[id number]]      Objective:   VITALS: Per patient if applicable, see vitals. GENERAL: Alert and in no acute distress. CARDIOPULMONARY: No increased WOB. Speaking in clear sentences.  PSYCH: Pleasant and cooperative. Speech normal rate and rhythm. Affect is appropriate. Insight and judgement are appropriate. Attention is focused, linear, and appropriate.  NEURO: Oriented as arrived to appointment on time with no prompting.   Attestation Statements:   Reviewed by clinician on day of visit: allergies, medications, problem list, medical history, surgical history, family history, social history, and previous encounter notes.   This was prepared with the assistance of Engineer, civil (consulting).  Occasional wrong-word or sound-a-like substitutions may have occurred due to the inherent limitations of voice recognition software.

## 2023-01-06 ENCOUNTER — Encounter (INDEPENDENT_AMBULATORY_CARE_PROVIDER_SITE_OTHER): Payer: Self-pay | Admitting: Family Medicine

## 2023-01-06 ENCOUNTER — Telehealth (INDEPENDENT_AMBULATORY_CARE_PROVIDER_SITE_OTHER): Payer: Commercial Managed Care - PPO | Admitting: Family Medicine

## 2023-01-06 ENCOUNTER — Other Ambulatory Visit: Payer: Self-pay

## 2023-01-06 DIAGNOSIS — F3289 Other specified depressive episodes: Secondary | ICD-10-CM | POA: Diagnosis not present

## 2023-01-06 DIAGNOSIS — E559 Vitamin D deficiency, unspecified: Secondary | ICD-10-CM

## 2023-01-06 DIAGNOSIS — E88819 Insulin resistance, unspecified: Secondary | ICD-10-CM | POA: Diagnosis not present

## 2023-01-06 DIAGNOSIS — Z6839 Body mass index (BMI) 39.0-39.9, adult: Secondary | ICD-10-CM | POA: Diagnosis not present

## 2023-01-06 MED ORDER — VITAMIN D (ERGOCALCIFEROL) 1.25 MG (50000 UNIT) PO CAPS
50000.0000 [IU] | ORAL_CAPSULE | ORAL | 0 refills | Status: DC
  Filled 2023-01-06: qty 4, 28d supply, fill #0

## 2023-01-06 MED ORDER — ESCITALOPRAM OXALATE 10 MG PO TABS
10.0000 mg | ORAL_TABLET | Freq: Every day | ORAL | 0 refills | Status: DC
  Filled 2023-01-06: qty 30, 30d supply, fill #0

## 2023-01-06 MED ORDER — ESCITALOPRAM OXALATE 5 MG PO TABS
5.0000 mg | ORAL_TABLET | Freq: Every day | ORAL | 0 refills | Status: DC
  Filled 2023-01-06: qty 30, 30d supply, fill #0

## 2023-01-06 MED ORDER — METFORMIN HCL 500 MG PO TABS
500.0000 mg | ORAL_TABLET | Freq: Every day | ORAL | 0 refills | Status: DC
  Filled 2023-01-06: qty 30, 30d supply, fill #0

## 2023-01-14 ENCOUNTER — Telehealth: Payer: Self-pay

## 2023-01-14 NOTE — Telephone Encounter (Signed)
PA submitted through Cover My Meds for Zepbound.  Per Insurance:  This drug/product is not covered under the pharmacy benefit. Prior Authorization is not available.

## 2023-02-03 ENCOUNTER — Ambulatory Visit (INDEPENDENT_AMBULATORY_CARE_PROVIDER_SITE_OTHER): Payer: Commercial Managed Care - PPO | Admitting: Family Medicine

## 2023-02-04 ENCOUNTER — Telehealth (INDEPENDENT_AMBULATORY_CARE_PROVIDER_SITE_OTHER): Payer: Commercial Managed Care - PPO | Admitting: Family Medicine

## 2023-02-04 ENCOUNTER — Other Ambulatory Visit (HOSPITAL_COMMUNITY): Payer: Self-pay

## 2023-02-04 ENCOUNTER — Encounter (INDEPENDENT_AMBULATORY_CARE_PROVIDER_SITE_OTHER): Payer: Self-pay | Admitting: Family Medicine

## 2023-02-04 DIAGNOSIS — F3289 Other specified depressive episodes: Secondary | ICD-10-CM

## 2023-02-04 DIAGNOSIS — E559 Vitamin D deficiency, unspecified: Secondary | ICD-10-CM | POA: Diagnosis not present

## 2023-02-04 DIAGNOSIS — R7303 Prediabetes: Secondary | ICD-10-CM

## 2023-02-04 DIAGNOSIS — E88819 Insulin resistance, unspecified: Secondary | ICD-10-CM

## 2023-02-04 DIAGNOSIS — Z6839 Body mass index (BMI) 39.0-39.9, adult: Secondary | ICD-10-CM

## 2023-02-04 MED ORDER — VITAMIN D (ERGOCALCIFEROL) 1.25 MG (50000 UNIT) PO CAPS
50000.0000 [IU] | ORAL_CAPSULE | ORAL | 0 refills | Status: DC
  Filled 2023-02-04: qty 4, 28d supply, fill #0

## 2023-02-04 MED ORDER — ESCITALOPRAM OXALATE 10 MG PO TABS
10.0000 mg | ORAL_TABLET | Freq: Every day | ORAL | 0 refills | Status: DC
  Filled 2023-02-04: qty 30, 30d supply, fill #0

## 2023-02-04 MED ORDER — METFORMIN HCL 500 MG PO TABS
500.0000 mg | ORAL_TABLET | Freq: Two times a day (BID) | ORAL | 0 refills | Status: DC
  Filled 2023-02-04: qty 60, 30d supply, fill #0

## 2023-02-04 MED ORDER — ESCITALOPRAM OXALATE 5 MG PO TABS
5.0000 mg | ORAL_TABLET | Freq: Every day | ORAL | 0 refills | Status: DC
  Filled 2023-02-04: qty 30, 30d supply, fill #0

## 2023-02-04 NOTE — Progress Notes (Signed)
TeleHealth Visit:  This visit was completed with telemedicine (audio/video) technology. Alexandra Jones has verbally consented to this TeleHealth visit. The patient is located at home, the provider is located at home. The participants in this visit include the listed provider and patient. The visit was conducted today via MyChart video.  OBESITY Alexandra Jones is here to discuss her progress with her obesity treatment plan along with follow-up of her obesity related diagnoses.   Today's visit was # 36 Starting weight: 250 lbs Starting date: 10/01/2020 Weight at last in office visit: 240 lbs on 12/09/22 Total weight loss: 10 lbs at last in office visit on 12/09/22. Today's reported weight (02/04/23):  242 lbs   Nutrition Plan: keeping a food journal with goal of 1200-1400 calories and 90 grams of protein daily - 65-75% adherence.   Current exercise:  Horseback riding 60 minutes 3 times weekly.  Nutrition Plan: keeping a food journal with goal of 1200-1400 calories and 90 grams of protein daily - 40 percent adherence  Current exercise:  Horseback riding 60 minutes 3 times weekly. Weight lifting once weekly.  Interim History:  She is struggling with sweets cravings and poor food choices without the Zepbound. Experiencing some sabotage from family.  Mother brings sweets to her.  Boyfriend recently moved in and tends to sway her food choices. Denies excessive hunger.  She is journaling only 40% of the time. She is eating regular meals. Protein intake is hit or miss.  Eating out about 3 meals per week.  She has good intentions and actually grocery shops to buy healthy food every weekend.  Stays on plan well at breakfast and lunch but then gets off plan at dinner.  She will start the day journaling but not finish the day when choices are poor. Boyfriend recently moved in which has been detrimental.   Assessment/Plan:  1. Eating disorder/emotional eating Alexandra Jones has had issues with stress/emotional  eating.  Currently this is poorly controlled. Overall mood is stable.  She is on Lexapro 15 mg and feels this is beneficial for mood.  Discussed starting bupropion but she is hesitant because she had side effects when for starting Lexapro. Had visits with Dr. Dewaine Conger in the past.  Plan: Declines bupropion  for now.  Refill Lexapro 10 mg and Lexapro 5 mg for a total of 15 mg daily.  2. Prediabetes Last A1c was 5.6 on 10/14/2022. Medication(s): Metformin 500 mg once daily breakfast Denies side effects. Lab Results  Component Value Date   HGBA1C 5.6 10/14/2022   HGBA1C 5.5 06/26/2022   HGBA1C 5.5 11/04/2021   HGBA1C 5.6 06/24/2021   HGBA1C 5.5 02/26/2021   Lab Results  Component Value Date   INSULIN 24.9 10/14/2022   INSULIN 31.1 (H) 06/26/2022   INSULIN 22.1 11/04/2021   INSULIN 14.5 06/24/2021   INSULIN 25.5 (H) 02/26/2021    Plan: Continue and increase dose Metformin 500 mg twice daily with meals Check labs next office visit.  3. Vitamin D Deficiency Vitamin D is not at goal of 50.  Most recent vitamin D level was 29 on 10/14/2022.Marland Kitchen She is on  prescription ergocalciferol 50,000 IU weekly. Lab Results  Component Value Date   VD25OH 29.2 (L) 10/14/2022   VD25OH 38.4 06/26/2022   VD25OH 37.1 11/04/2021    Plan: Continue and refill  prescription ergocalciferol 50,000 IU weekly Check labs next office visit.  4. Morbid Obesity: Current BMI 39  Anecia is currently in the action stage of change. As such, her goal is to  continue with weight loss efforts.  She has agreed to keeping a food journal with goal of 1200-1400 calories and 90 grams of protein daily.  1.  Journal at least 5 days/week. 2.  Journal "the good, the bad, the ugly".  Exercise goals: Continue horseback riding.  Work on adding in 1 more day of resistance training-2 days/week.  Behavioral modification strategies: increasing lean protein intake, decreasing simple carbohydrates , decrease eating out, planning  for success, and increase frequency of journaling.  Vona has agreed to follow-up with our clinic in 4 weeks with fasting labs.  No orders of the defined types were placed in this encounter.   Medications Discontinued During This Encounter  Medication Reason   escitalopram (LEXAPRO) 10 MG tablet Reorder   escitalopram (LEXAPRO) 5 MG tablet Reorder   Vitamin D, Ergocalciferol, (DRISDOL) 1.25 MG (50000 UNIT) CAPS capsule Reorder   metFORMIN (GLUCOPHAGE) 500 MG tablet Reorder     Meds ordered this encounter  Medications   escitalopram (LEXAPRO) 10 MG tablet    Sig: Take 1 tablet (10 mg total) by mouth daily.    Dispense:  30 tablet    Refill:  0    Order Specific Question:   Supervising Provider    Answer:   Glennis Brink [[id number]]   escitalopram (LEXAPRO) 5 MG tablet    Sig: Take 1 tablet (5 mg total) by mouth daily.    Dispense:  30 tablet    Refill:  0    Order Specific Question:   Supervising Provider    Answer:   Glennis Brink [[id number]]   metFORMIN (GLUCOPHAGE) 500 MG tablet    Sig: Take 1 tablet (500 mg total) by mouth 2 (two) times daily with a meal.    Dispense:  60 tablet    Refill:  0    Order Specific Question:   Supervising Provider    Answer:   Carolin Sicks   Vitamin D, Ergocalciferol, (DRISDOL) 1.25 MG (50000 UNIT) CAPS capsule    Sig: Take 1 capsule (50,000 Units total) by mouth every 7 (seven) days.    Dispense:  4 capsule    Refill:  0    Order Specific Question:   Supervising Provider    Answer:   Glennis Brink [[id number]]      Objective:   VITALS: Per patient if applicable, see vitals. GENERAL: Alert and in no acute distress. CARDIOPULMONARY: No increased WOB. Speaking in clear sentences.  PSYCH: Pleasant and cooperative. Speech normal rate and rhythm. Affect is appropriate. Insight and judgement are appropriate. Attention is focused, linear, and appropriate.  NEURO: Oriented as arrived to appointment on time with no prompting.   Attestation  Statements:   Reviewed by clinician on day of visit: allergies, medications, problem list, medical history, surgical history, family history, social history, and previous encounter notes.   This was prepared with the assistance of Engineer, civil (consulting).  Occasional wrong-word or sound-a-like substitutions may have occurred due to the inherent limitations of voice recognition software.

## 2023-02-26 ENCOUNTER — Ambulatory Visit: Payer: Commercial Managed Care - PPO | Admitting: Adult Health

## 2023-02-26 ENCOUNTER — Encounter: Payer: Self-pay | Admitting: Adult Health

## 2023-02-26 VITALS — BP 112/74 | HR 65 | Ht 65.0 in | Wt 251.5 lb

## 2023-02-26 DIAGNOSIS — B369 Superficial mycosis, unspecified: Secondary | ICD-10-CM

## 2023-02-26 DIAGNOSIS — L292 Pruritus vulvae: Secondary | ICD-10-CM

## 2023-02-26 DIAGNOSIS — N9089 Other specified noninflammatory disorders of vulva and perineum: Secondary | ICD-10-CM | POA: Diagnosis not present

## 2023-02-26 MED ORDER — FLUCONAZOLE 150 MG PO TABS: ORAL_TABLET | ORAL | 1 refills | Status: DC

## 2023-02-26 NOTE — Progress Notes (Signed)
  Subjective:     Patient ID: Alexandra Jones, female   DOB: [birth year], 27 y.o.   MRN: [id number]  HPI Alexandra Jones is a 27 year old white female with SO, in complaining of itchy rash in vulva area that is spreading, tried monistat.     Component Value Date/Time   DIAGPAP  02/04/2022 0844    - Negative for intraepithelial lesion or malignancy (NILM)   DIAGPAP  01/13/2018 0000    NEGATIVE FOR INTRAEPITHELIAL LESIONS OR MALIGNANCY.   DIAGPAP  01/13/2018 0000    FUNGAL ORGANISMS PRESENT CONSISTENT WITH CANDIDA SPP.   HPVHIGH Negative 02/04/2022 0844   ADEQPAP  02/04/2022 0844    Satisfactory for evaluation; transformation zone component PRESENT.   ADEQPAP  01/13/2018 0000    Satisfactory for evaluation  endocervical/transformation zone component PRESENT.   PCP is Dr Adriana Simas   Review of Systems +itchy rash in vulva area that is spreading  Has had about 2 weeks and tried monistat Reviewed past medical,surgical, social and family history. Reviewed medications and allergies.     Objective:   Physical Exam BP 112/74 (BP Location: Left Arm, Patient Position: Sitting, Cuff Size: Normal)   Pulse 65   Ht 5\' 5"  (1.651 m)   Wt 251 lb 8 oz (114.1 kg)   LMP 02/18/2023   BMI 41.85 kg/m     Skin warm and dry.Pelvic: external genitalia is normal in appearance, has red scaly rash on labia, and in groin area, painted with gentian violet,vagina: pink,urethra has no lesions or masses noted, cervix is smooth, uterus: normal size, shape and contour, non tender, no masses felt, adnexa: no masses or tenderness noted. Bladder is non tender and no masses felt.  Fall risk is low  Upstream - 02/26/23 0902       Pregnancy Intention Screening   Does the patient want to become pregnant in the next year? Yes    Does the patient's partner want to become pregnant in the next year? Yes    Would the patient like to discuss contraceptive options today? No      Contraception Wrap Up   Current Method Withdrawal or Other  Method    End Method Withdrawal or Other Method            Examination chaperoned by Malachy Mood LPN  Assessment:     1. Vulvar irritation Painted with gentian violet   2. Vulvar itching Painted with gentian violet Will rx diflucan   3. Superficial fungus infection of skin Will rx diflucan Meds ordered this encounter  Medications   fluconazole (DIFLUCAN) 150 MG tablet    Sig: Take 1 now and repeat 1 in 3 days then repeat again if needed    Dispense:  2 tablet    Refill:  1    Order Specific Question:   Supervising Provider    Answer:   Lazaro Arms [[id number]]       Plan:     Follow up 03/13/23 for recheck

## 2023-03-03 ENCOUNTER — Other Ambulatory Visit (HOSPITAL_COMMUNITY): Payer: Self-pay

## 2023-03-03 ENCOUNTER — Encounter (INDEPENDENT_AMBULATORY_CARE_PROVIDER_SITE_OTHER): Payer: Self-pay | Admitting: Family Medicine

## 2023-03-03 ENCOUNTER — Ambulatory Visit (INDEPENDENT_AMBULATORY_CARE_PROVIDER_SITE_OTHER): Payer: Commercial Managed Care - PPO | Admitting: Family Medicine

## 2023-03-03 ENCOUNTER — Other Ambulatory Visit: Payer: Self-pay

## 2023-03-03 VITALS — BP 111/69 | HR 62 | Temp 97.8°F | Ht 65.0 in | Wt 245.0 lb

## 2023-03-03 DIAGNOSIS — E559 Vitamin D deficiency, unspecified: Secondary | ICD-10-CM | POA: Diagnosis not present

## 2023-03-03 DIAGNOSIS — E782 Mixed hyperlipidemia: Secondary | ICD-10-CM | POA: Diagnosis not present

## 2023-03-03 DIAGNOSIS — F3289 Other specified depressive episodes: Secondary | ICD-10-CM | POA: Diagnosis not present

## 2023-03-03 DIAGNOSIS — E669 Obesity, unspecified: Secondary | ICD-10-CM | POA: Diagnosis not present

## 2023-03-03 DIAGNOSIS — I10 Essential (primary) hypertension: Secondary | ICD-10-CM

## 2023-03-03 DIAGNOSIS — R7303 Prediabetes: Secondary | ICD-10-CM | POA: Diagnosis not present

## 2023-03-03 DIAGNOSIS — Z6841 Body Mass Index (BMI) 40.0 and over, adult: Secondary | ICD-10-CM | POA: Insufficient documentation

## 2023-03-03 MED ORDER — ESCITALOPRAM OXALATE 10 MG PO TABS
10.0000 mg | ORAL_TABLET | Freq: Every day | ORAL | 0 refills | Status: DC
  Filled 2023-03-03: qty 30, 30d supply, fill #0

## 2023-03-03 MED ORDER — VITAMIN D (ERGOCALCIFEROL) 1.25 MG (50000 UNIT) PO CAPS
50000.0000 [IU] | ORAL_CAPSULE | ORAL | 0 refills | Status: DC
  Filled 2023-03-03: qty 4, 28d supply, fill #0

## 2023-03-03 MED ORDER — ESCITALOPRAM OXALATE 5 MG PO TABS
5.0000 mg | ORAL_TABLET | Freq: Every day | ORAL | 0 refills | Status: DC
  Filled 2023-03-03: qty 30, 30d supply, fill #0

## 2023-03-03 NOTE — Progress Notes (Unsigned)
Chief Complaint:   OBESITY Alexandra Jones is here to discuss her progress with her obesity treatment plan along with follow-up of her obesity related diagnoses. [name] is on keeping a food journal and adhering to recommended goals of 1200-1400 calories and 90+ grams of protein and states she is following her eating plan approximately 40% of the time. [name] states she is doing 0 minutes 0 times per week.  Today's visit was #: 37 Starting weight: 250 lbs Starting date: 10/01/2020 Today's weight: 245 lbs Today's date: 03/03/2023 Total lbs lost to date: 5 Total lbs lost since last in-office visit: 0  Interim History: Patient has been struggling more over the last 2 months. She notes increased cravings and decreased journaling and her weight is creeping up. 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 She does expect exercise to increase.   Subjective:   1. Vitamin D deficiency Patient is on Vitamin D, and she is due for labs. No side effects were noted.   2. Prediabetes Patient has not been on metformin since she felt it was not helping. She notes her weight has crept up since stopping.   3. Mixed hyperlipidemia Patient is working on her diet, and she is due for labs.   4. Essential hypertension Patient is working on her diet and weight loss, and she is due for labs.   5. Emotional Eating Behavior Patient is doing well on her medications, but she notes stress has increased and her mood has somewhat decreased. She is sleeping more on the weekend but she is getting 7-8 hours of sleep most weekdays.    Assessment/Plan:   1. Vitamin D deficiency We will check labs today, and we will refill prescription Vitamin D once weekly for 1 month.   - Vitamin D, Ergocalciferol, (DRISDOL) 1.25 MG (50000 UNIT) CAPS capsule; Take 1 capsule (50,000 Units total) by mouth every 7 (seven) days.  Dispense: 4 capsule; Refill: 0 - VITAMIN D 25 Hydroxy  (Vit-D Deficiency, Fractures)  2. Prediabetes We will check labs today. Patient will restart metformin, and we will follow-up at her next visit.   - Vitamin B12 - Insulin, random - Hemoglobin A1c  3. Mixed hyperlipidemia We will check labs today. Patient will continue with her diet, and we will follow-up at her next visit.   - Lipid Panel With LDL/HDL Ratio  4. Essential hypertension We will check labs today. Patient will continue with her diet and exercise, and we will follow-up at her next visit.   - CMP14+EGFR  5. Emotional Eating Behavior Patient will continue Lexapro 10 mg and Lexapro 5 mg, and  we will refill both for 1 month.   - escitalopram (LEXAPRO) 10 MG tablet; Take 1 tablet (10 mg total) by mouth daily.  Dispense: 30 tablet; Refill: 0 - escitalopram (LEXAPRO) 5 MG tablet; Take 1 tablet (5 mg total) by mouth daily.  Dispense: 30 tablet; Refill: 0  6. BMI 40.0-44.9, adult (HCC)  7. Obesity, Beginning BMI 41.60 [name] is currently in the action stage of change. As such, her goal is to continue with weight loss efforts. She has agreed to keeping a food journal and adhering to recommended goals of 1200-1400 calories and 90+ grams of protein daily.   Behavioral modification strategies: increasing lean protein intake, ways to avoid boredom eating, ways to avoid night time snacking, emotional eating strategies, and travel eating strategies.  [name] has agreed  to follow-up with our clinic in 4 weeks. She was informed of the importance of frequent follow-up visits to maximize her success with intensive lifestyle modifications for her multiple health conditions.   Objective:   Blood pressure 111/69, pulse 62, temperature 97.8 F (36.6 C), height 5\' 5"  (1.651 m), weight 245 lb (111.1 kg), last menstrual period 02/18/2023, SpO2 96%. Body mass index is 40.77 kg/m.  Lab Results  Component Value Date   CREATININE 0.74 10/14/2022   BUN 8 10/14/2022   NA 141 10/14/2022   K 4.1  10/14/2022   CL 102 10/14/2022   CO2 22 10/14/2022   Lab Results  Component Value Date   ALT 25 10/14/2022   AST 19 10/14/2022   ALKPHOS 54 10/14/2022   BILITOT 0.4 10/14/2022   Lab Results  Component Value Date   HGBA1C 5.6 10/14/2022   HGBA1C 5.5 06/26/2022   HGBA1C 5.5 11/04/2021   HGBA1C 5.6 06/24/2021   HGBA1C 5.5 02/26/2021   Lab Results  Component Value Date   INSULIN 24.9 10/14/2022   INSULIN 31.1 (H) 06/26/2022   INSULIN 22.1 11/04/2021   INSULIN 14.5 06/24/2021   INSULIN 25.5 (H) 02/26/2021   Lab Results  Component Value Date   TSH 1.770 09/30/2022   Lab Results  Component Value Date   CHOL 158 10/14/2022   HDL 34 (L) 10/14/2022   LDLCALC 87 10/14/2022   TRIG 217 (H) 10/14/2022   CHOLHDL 5.1 (H) 03/18/2019   Lab Results  Component Value Date   VD25OH 29.2 (L) 10/14/2022   VD25OH 38.4 06/26/2022   VD25OH 37.1 11/04/2021   Lab Results  Component Value Date   WBC 7.3 09/30/2022   HGB 14.8 09/30/2022   HCT 44.3 09/30/2022   MCV 88 09/30/2022   PLT 269 09/30/2022   No results found for: "IRON", "TIBC", "FERRITIN"  Attestation Statements:   Reviewed by clinician on day of visit: allergies, medications, problem list, medical history, surgical history, family history, social history, and previous encounter notes.   I, Burt Knack, am acting as transcriptionist for Quillian Quince, MD.  I have reviewed the above documentation for accuracy and completeness, and I agree with the above. -  Quillian Quince, MD

## 2023-03-04 LAB — HEMOGLOBIN A1C
Est. average glucose Bld gHb Est-mCnc: 114 mg/dL
Hgb A1c MFr Bld: 5.6 % (ref 4.8–5.6)

## 2023-03-04 LAB — CMP14+EGFR
ALT: 22 IU/L (ref 0–32)
AST: 20 IU/L (ref 0–40)
Albumin: 4.6 g/dL (ref 4.0–5.0)
Alkaline Phosphatase: 58 IU/L (ref 44–121)
BUN/Creatinine Ratio: 16 (ref 9–23)
BUN: 11 mg/dL (ref 6–20)
Bilirubin Total: 0.3 mg/dL (ref 0.0–1.2)
CO2: 25 mmol/L (ref 20–29)
Calcium: 9.1 mg/dL (ref 8.7–10.2)
Chloride: 101 mmol/L (ref 96–106)
Creatinine, Ser: 0.7 mg/dL (ref 0.57–1.00)
Globulin, Total: 2.5 g/dL (ref 1.5–4.5)
Glucose: 96 mg/dL (ref 70–99)
Potassium: 4.5 mmol/L (ref 3.5–5.2)
Sodium: 139 mmol/L (ref 134–144)
Total Protein: 7.1 g/dL (ref 6.0–8.5)
eGFR: 120 mL/min/{1.73_m2} (ref >59)

## 2023-03-04 LAB — LIPID PANEL WITH LDL/HDL RATIO
Cholesterol, Total: 179 mg/dL (ref 100–199)
HDL: 38 mg/dL — ABNORMAL LOW (ref >39)
LDL Chol Calc (NIH): 102 mg/dL — ABNORMAL HIGH (ref 0–99)
LDL/HDL Ratio: 2.7 ratio (ref 0.0–3.2)
Triglycerides: 225 mg/dL — ABNORMAL HIGH (ref 0–149)
VLDL Cholesterol Cal: 39 mg/dL (ref 5–40)

## 2023-03-04 LAB — INSULIN, RANDOM: INSULIN: 3.1 u[IU]/mL (ref 2.6–24.9)

## 2023-03-04 LAB — VITAMIN B12: Vitamin B-12: 413 pg/mL (ref 232–1245)

## 2023-03-04 LAB — VITAMIN D 25 HYDROXY (VIT D DEFICIENCY, FRACTURES): Vit D, 25-Hydroxy: 35 ng/mL (ref 30.0–100.0)

## 2023-03-05 ENCOUNTER — Other Ambulatory Visit: Payer: Self-pay | Admitting: Adult Health

## 2023-03-05 MED ORDER — FLUCONAZOLE 150 MG PO TABS
ORAL_TABLET | ORAL | 1 refills | Status: DC
  Filled 2023-03-05: qty 2, 3d supply, fill #0

## 2023-03-05 NOTE — Progress Notes (Signed)
Refilled diflucan 

## 2023-03-06 ENCOUNTER — Other Ambulatory Visit: Payer: Self-pay

## 2023-03-13 ENCOUNTER — Encounter: Payer: Self-pay | Admitting: Adult Health

## 2023-03-13 ENCOUNTER — Ambulatory Visit: Payer: Commercial Managed Care - PPO | Admitting: Adult Health

## 2023-03-13 VITALS — BP 121/78 | HR 75 | Ht 65.0 in | Wt 251.0 lb

## 2023-03-13 DIAGNOSIS — B369 Superficial mycosis, unspecified: Secondary | ICD-10-CM | POA: Diagnosis not present

## 2023-03-13 DIAGNOSIS — N9089 Other specified noninflammatory disorders of vulva and perineum: Secondary | ICD-10-CM

## 2023-03-13 DIAGNOSIS — L292 Pruritus vulvae: Secondary | ICD-10-CM | POA: Diagnosis not present

## 2023-03-13 MED ORDER — FLUCONAZOLE 100 MG PO TABS: 100.0000 mg | ORAL_TABLET | Freq: Every day | ORAL | 0 refills | Status: AC

## 2023-03-13 MED ORDER — NYSTATIN 100000 UNIT/GM EX POWD: 1.0000 | Freq: Three times a day (TID) | CUTANEOUS | 1 refills | Status: DC

## 2023-03-13 NOTE — Progress Notes (Signed)
Subjective:     Patient ID: Alexandra Jones, female   DOB: 12/18/1995, 27 y.o.   MRN: [id number]  HPI Heavenly is a 27 year old white female with SO, G0P0, back in follow up on taking diflucan and being treated with gentian violet, 02/26/23, and still having some itching, would get better and start back.      Component Value Date/Time   DIAGPAP  02/04/2022 0844    - Negative for intraepithelial lesion or malignancy (NILM)   DIAGPAP  01/13/2018 0000    NEGATIVE FOR INTRAEPITHELIAL LESIONS OR MALIGNANCY.   DIAGPAP  01/13/2018 0000    FUNGAL ORGANISMS PRESENT CONSISTENT WITH CANDIDA SPP.   HPVHIGH Negative 02/04/2022 0844   ADEQPAP  02/04/2022 0844    Satisfactory for evaluation; transformation zone component PRESENT.   ADEQPAP  01/13/2018 0000    Satisfactory for evaluation  endocervical/transformation zone component PRESENT.   PCP is Dr Adriana Simas.  Review of Systems Still has itching esp vulva area into groin Reviewed past medical,surgical, social and family history. Reviewed medications and allergies.     Objective:   Physical Exam BP 121/78 (BP Location: Left Arm, Patient Position: Sitting, Cuff Size: Large)   Pulse 75   Ht 5\' 5"  (1.651 m)   Wt 251 lb (113.9 kg)   LMP 02/18/2023   BMI 41.77 kg/m     Skin warm and dry.Pelvic: external genitalia is normal in appearance, red areas near base of vulva and inner leg, has not shaved lately, vagina: white discharge with odor,urethra has no lesions or masses noted, cervix:smooth, uterus: normal size, shape and contour, non tender, no masses felt, adnexa: no masses or tenderness noted. Bladder is non tender and no masses felt Fall risk is low    03/13/2023    9:14 AM 10/07/2022   11:07 AM 05/13/2022    8:30 AM  Depression screen PHQ 2/9  Decreased Interest 1 1 0  Down, Depressed, Hopeless 1 0 0  PHQ - 2 Score 2 1 0  Altered sleeping 1 1   Tired, decreased energy 2 1   Change in appetite 1 0   Feeling bad or failure about yourself  1 0    Trouble concentrating 0 0   Moving slowly or fidgety/restless 0 0   Suicidal thoughts 0 0   PHQ-9 Score 7 3   Difficult doing work/chores Not difficult at all Somewhat difficult      Upstream - 03/13/23 0912       Pregnancy Intention Screening   Does the patient want to become pregnant in the next year? Yes    Does the patient's partner want to become pregnant in the next year? Yes    Would the patient like to discuss contraceptive options today? No      Contraception Wrap Up   Current Method Withdrawal or Other Method   Rhythm   End Method Withdrawal or Other Method   rhythm           Examination chaperoned by Freddie Apley RN   Assessment:     1. Vulvar itching Will rx diflucan and nystatin powder  2. Vulvar irritation Still has red areas low vulva and crease of leg   3. Superficial fungus infection of skin Will rx diflucan 100 mg 1 daily for 15 days   Meds ordered this encounter  Medications   fluconazole (DIFLUCAN) 100 MG tablet    Sig: Take 1 tablet (100 mg total) by mouth daily for 14 days.  Dispense:  15 tablet    Refill:  0    Order Specific Question:   Supervising Provider    Answer:   Duane Lope H [[id number]]   nystatin (MYCOSTATIN/NYSTOP) powder    Sig: Apply 1 Application topically 3 (three) times daily.    Dispense:  60 g    Refill:  1    Order Specific Question:   Supervising Provider    Answer:   Lazaro Arms [[id number]]       Plan:     Follow up 03/30/23

## 2023-03-30 ENCOUNTER — Encounter: Payer: Self-pay | Admitting: Adult Health

## 2023-03-30 ENCOUNTER — Ambulatory Visit: Payer: Commercial Managed Care - PPO | Admitting: Adult Health

## 2023-03-30 VITALS — BP 124/79 | HR 87 | Ht 65.0 in | Wt 256.0 lb

## 2023-03-30 DIAGNOSIS — L292 Pruritus vulvae: Secondary | ICD-10-CM | POA: Diagnosis not present

## 2023-03-30 DIAGNOSIS — Z3201 Encounter for pregnancy test, result positive: Secondary | ICD-10-CM | POA: Diagnosis not present

## 2023-03-30 DIAGNOSIS — Z3A01 Less than 8 weeks gestation of pregnancy: Secondary | ICD-10-CM

## 2023-03-30 DIAGNOSIS — O3680X0 Pregnancy with inconclusive fetal viability, not applicable or unspecified: Secondary | ICD-10-CM | POA: Diagnosis not present

## 2023-03-30 LAB — POCT URINE PREGNANCY: Preg Test, Ur: POSITIVE — AB

## 2023-03-30 NOTE — Progress Notes (Signed)
TeleHealth Visit:  This visit was completed with telemedicine (audio/video) technology. Nevaya has verbally consented to this TeleHealth visit. The patient is located at home, the provider is located at home. The participants in this visit include the listed provider and patient. The visit was conducted today via MyChart video.  OBESITY Alexandra Jones is here to discuss her progress with her obesity treatment plan along with follow-up of her obesity related diagnoses.   Today's visit was # 38 Starting weight: 250 lbs Starting date: 10/01/20 Weight at last in office visit: 245 lbs on 03/03/23 Total weight loss: 0 lbs at last in office visit on 03/03/23. Today's reported weight (03/31/23): none reported  Nutrition Plan: keeping a food journal with goal of 1200-1400 calories and 90 grams of protein daily - 0% adherence.  Current exercise:  none  Interim History:  She has not been following a meal plan. She is eating regular meals and having protein at all meals. She found out a few days ago that she is pregnant and it was unplanned.  She had a visit with her gynecologist yesterday and will have a follow-up visit in September.  Assessment/Plan:  We discussed recent lab results in depth.  1.  Less than [redacted] weeks gestation of pregnancy Saw OB/GYN Cyril Mourning, NP) yesterday.  Dating ultrasound planned in 2 weeks.  Her medication list was not discussed.  Currently we are prescribing Lexapro and metformin.  Neither of these are teratogenic. She was told to take a prenatal vitamin daily.  Plan: Encouraged regular meals with protein in every meal and plenty of fruits and vegetables. Advised her to send message to Cyril Mourning her nurse practitioner to see if she would like her to discontinue any of her current medications-specifically metformin, Lexapro.  2. Vitamin D Deficiency Vitamin D is not at goal of 50.  Most recent vitamin D level was 35. She is on  prescription ergocalciferol  50,000 IU weekly. Lab Results  Component Value Date   VD25OH 35.0 03/03/2023   VD25OH 29.2 (L) 10/14/2022   VD25OH 38.4 06/26/2022    Plan: Discontinue  prescription ergocalciferol 50,000 IU weekly Follow-up with gynecology to see if they recommend daily low-dose vitamin D during pregnancy.   3. Insulin Resistance Last fasting insulin was improved at 3.1. A1c was 5.6. Medication(s): Metformin 500 mg once daily breakfast.  Prescribed to be taken twice daily Lab Results  Component Value Date   HGBA1C 5.6 03/03/2023   HGBA1C 5.6 10/14/2022   HGBA1C 5.5 06/26/2022   HGBA1C 5.5 11/04/2021   HGBA1C 5.6 06/24/2021   Lab Results  Component Value Date   INSULIN 3.1 03/03/2023   INSULIN 24.9 10/14/2022   INSULIN 31.1 (H) 06/26/2022   INSULIN 22.1 11/04/2021   INSULIN 14.5 06/24/2021    Plan: Continue and refill Metformin 500 mg twice daily with meals May continue to take metformin once daily. Discussed whether she should continue this with OB/GYN.  4. Other depression/emotional eating Kenzley has had issues with stress/emotional eating. Currently this is moderately controlled.  Currently having a great deal of stress due to unexpected pregnancy.  Plan: Refill Lexapro 5 mg and 10 mg daily for total of 15 mg daily. Advised her to send message to Cyril Mourning about continuing during pregnancy.  5. Morbid Obesity: Current BMI 40 We discussed that we often have women who are pregnant continue with our program during the pregnancy to facilitate healthy weight during pregnancy.  She has follow-up with Dr. Dalbert Garnet on September 19 and  I recommended that she keep this appointment. Takyia is not currently in the action stage of change. As such, her goal is to maintain weight for now.  She has agreed to practicing portion control and making smarter food choices, such as increasing vegetables and decreasing simple carbohydrates.  Exercise goals: All adults should avoid inactivity. Some  physical activity is better than none, and adults who participate in any amount of physical activity gain some health benefits.  Behavioral modification strategies: increasing lean protein intake, decreasing simple carbohydrates , increasing vegetables, and increasing lower sugar fruits.  Raizel has agreed to follow-up with our clinic in 4 weeks.  No orders of the defined types were placed in this encounter.   Medications Discontinued During This Encounter  Medication Reason   metFORMIN (GLUCOPHAGE) 500 MG tablet Reorder   escitalopram (LEXAPRO) 10 MG tablet Reorder   escitalopram (LEXAPRO) 5 MG tablet Reorder   Vitamin D, Ergocalciferol, (DRISDOL) 1.25 MG (50000 UNIT) CAPS capsule Reorder   Vitamin D, Ergocalciferol, (DRISDOL) 1.25 MG (50000 UNIT) CAPS capsule Discontinued by provider     Meds ordered this encounter  Medications   metFORMIN (GLUCOPHAGE) 500 MG tablet    Sig: Take 1 tablet (500 mg total) by mouth 2 (two) times daily with a meal.    Dispense:  60 tablet    Refill:  0    Order Specific Question:   Supervising Provider    Answer:   Carolin Sicks   DISCONTD: Vitamin D, Ergocalciferol, (DRISDOL) 1.25 MG (50000 UNIT) CAPS capsule    Sig: Take 1 capsule (50,000 Units total) by mouth every 7 (seven) days.    Dispense:  4 capsule    Refill:  0    Order Specific Question:   Supervising Provider    Answer:   Glennis Brink [[id number]]   escitalopram (LEXAPRO) 10 MG tablet    Sig: Take 1 tablet (10 mg total) by mouth daily.    Dispense:  30 tablet    Refill:  0    Order Specific Question:   Supervising Provider    Answer:   Glennis Brink [[id number]]   escitalopram (LEXAPRO) 5 MG tablet    Sig: Take 1 tablet (5 mg total) by mouth daily.    Dispense:  30 tablet    Refill:  0    Order Specific Question:   Supervising Provider    Answer:   Glennis Brink [[id number]]      Objective:   VITALS: Per patient if applicable, see vitals. GENERAL: Alert and in no acute  distress. CARDIOPULMONARY: No increased WOB. Speaking in clear sentences.  PSYCH: Pleasant and cooperative. Speech normal rate and rhythm. Affect is appropriate. Insight and judgement are appropriate. Attention is focused, linear, and appropriate.  NEURO: Oriented as arrived to appointment on time with no prompting.   Attestation Statements:   Reviewed by clinician on day of visit: allergies, medications, problem list, medical history, surgical history, family history, social history, and previous encounter notes.   This was prepared with the assistance of Engineer, civil (consulting).  Occasional wrong-word or sound-a-like substitutions may have occurred due to the inherent limitations of voice recognition software.

## 2023-03-30 NOTE — Progress Notes (Signed)
  Subjective:     Patient ID: Alexandra Jones, female   DOB: March 08, 1996, 27 y.o.   MRN: [id number]  HPI Alexandra Jones is a 27 year old white female with SO, G1P0000, back in follow up on being treated for vulva itching and it is better, some itching in groin area,and she has missed a period and had +HPT.     Component Value Date/Time   DIAGPAP  02/04/2022 0844    - Negative for intraepithelial lesion or malignancy (NILM)   DIAGPAP  01/13/2018 0000    NEGATIVE FOR INTRAEPITHELIAL LESIONS OR MALIGNANCY.   DIAGPAP  01/13/2018 0000    FUNGAL ORGANISMS PRESENT CONSISTENT WITH CANDIDA SPP.   HPVHIGH Negative 02/04/2022 0844   ADEQPAP  02/04/2022 0844    Satisfactory for evaluation; transformation zone component PRESENT.   ADEQPAP  01/13/2018 0000    Satisfactory for evaluation  endocervical/transformation zone component PRESENT.   PCP is Dr Adriana Simas  Review of Systems Vulva itching is better, still has some in groin area Has missed a period and +HPT Reviewed past medical,surgical, social and family history. Reviewed medications and allergies.     Objective:   Physical Exam BP 124/79 (BP Location: Left Arm, Patient Position: Sitting, Cuff Size: Large)   Pulse 87   Ht 5\' 5"  (1.651 m)   Wt 256 lb (116.1 kg)   LMP 02/18/2023   BMI 42.60 kg/m  UPT is +, about 5+5 weeks by LMP, wit EDD 11/25/23. Skin warm and dry.Pelvic: external genitalia is normal in appearance no lesions, vagina: pink,urethra has no lesions or masses noted, cervix:smooth, uterus: normal size, shape and contour, non tender, no masses felt, adnexa: no masses or tenderness noted. Bladder is non tender and no masses felt.     Upstream - 03/30/23 1626       Pregnancy Intention Screening   Does the patient want to become pregnant in the next year? N/A    Does the patient's partner want to become pregnant in the next year? N/A    Would the patient like to discuss contraceptive options today? No      Contraception Wrap Up   Current  Method Pregnant/Seeking Pregnancy    End Method Pregnant/Seeking Pregnancy    Contraception Counseling Provided No            Examination chaperoned by Malachy Mood LPN  Assessment:     1. Pregnancy examination or test, positive result - POCT urine pregnancy  2. Less than [redacted] weeks gestation of pregnancy Continue PNV  3. Vulvar itching Itching in groin area, can use nystatin powders if needed, do not shave   4. Encounter to determine fetal viability of pregnancy, single or unspecified fetus Will get dating [place] in about 2 weeks  - US OB Comp Less 14 Wks; Future     Plan:     Return in 2 weeks for dating [place] Review OB packet

## 2023-03-31 ENCOUNTER — Encounter (INDEPENDENT_AMBULATORY_CARE_PROVIDER_SITE_OTHER): Payer: Self-pay | Admitting: Family Medicine

## 2023-03-31 ENCOUNTER — Other Ambulatory Visit: Payer: Self-pay

## 2023-03-31 ENCOUNTER — Other Ambulatory Visit (HOSPITAL_COMMUNITY): Payer: Self-pay

## 2023-03-31 ENCOUNTER — Telehealth (INDEPENDENT_AMBULATORY_CARE_PROVIDER_SITE_OTHER): Payer: Commercial Managed Care - PPO | Admitting: Family Medicine

## 2023-03-31 DIAGNOSIS — Z6841 Body Mass Index (BMI) 40.0 and over, adult: Secondary | ICD-10-CM | POA: Diagnosis not present

## 2023-03-31 DIAGNOSIS — E88819 Insulin resistance, unspecified: Secondary | ICD-10-CM | POA: Diagnosis not present

## 2023-03-31 DIAGNOSIS — F3289 Other specified depressive episodes: Secondary | ICD-10-CM | POA: Diagnosis not present

## 2023-03-31 DIAGNOSIS — Z3A01 Less than 8 weeks gestation of pregnancy: Secondary | ICD-10-CM

## 2023-03-31 DIAGNOSIS — E559 Vitamin D deficiency, unspecified: Secondary | ICD-10-CM

## 2023-03-31 MED ORDER — VITAMIN D (ERGOCALCIFEROL) 1.25 MG (50000 UNIT) PO CAPS
50000.0000 [IU] | ORAL_CAPSULE | ORAL | 0 refills | Status: DC
  Filled 2023-03-31: qty 4, 28d supply, fill #0

## 2023-03-31 MED ORDER — METFORMIN HCL 500 MG PO TABS
500.0000 mg | ORAL_TABLET | Freq: Two times a day (BID) | ORAL | 0 refills | Status: DC
  Filled 2023-03-31: qty 60, 30d supply, fill #0

## 2023-03-31 MED ORDER — ESCITALOPRAM OXALATE 5 MG PO TABS
5.0000 mg | ORAL_TABLET | Freq: Every day | ORAL | 0 refills | Status: DC
  Filled 2023-03-31: qty 30, 30d supply, fill #0

## 2023-03-31 MED ORDER — ESCITALOPRAM OXALATE 10 MG PO TABS
10.0000 mg | ORAL_TABLET | Freq: Every day | ORAL | 0 refills | Status: DC
  Filled 2023-03-31: qty 30, 30d supply, fill #0

## 2023-04-06 ENCOUNTER — Other Ambulatory Visit (HOSPITAL_COMMUNITY): Payer: Self-pay

## 2023-04-11 ENCOUNTER — Encounter (HOSPITAL_COMMUNITY): Payer: Self-pay

## 2023-04-11 ENCOUNTER — Emergency Department (HOSPITAL_COMMUNITY)
Admission: EM | Admit: 2023-04-11 | Discharge: 2023-04-11 | Disposition: A | Discharge status: Home / Self Care | Payer: Commercial Managed Care - PPO | Source: Home / Self Care | Attending: Emergency Medicine | Admitting: Emergency Medicine

## 2023-04-11 ENCOUNTER — Other Ambulatory Visit: Payer: Self-pay

## 2023-04-11 ENCOUNTER — Emergency Department (HOSPITAL_COMMUNITY): Payer: Commercial Managed Care - PPO

## 2023-04-11 DIAGNOSIS — O26851 Spotting complicating pregnancy, first trimester: Secondary | ICD-10-CM | POA: Diagnosis not present

## 2023-04-11 DIAGNOSIS — O10011 Pre-existing essential hypertension complicating pregnancy, first trimester: Secondary | ICD-10-CM | POA: Diagnosis not present

## 2023-04-11 DIAGNOSIS — O26891 Other specified pregnancy related conditions, first trimester: Secondary | ICD-10-CM | POA: Diagnosis not present

## 2023-04-11 DIAGNOSIS — Z79899 Other long term (current) drug therapy: Secondary | ICD-10-CM | POA: Diagnosis not present

## 2023-04-11 DIAGNOSIS — Z3490 Encounter for supervision of normal pregnancy, unspecified, unspecified trimester: Secondary | ICD-10-CM

## 2023-04-11 DIAGNOSIS — O209 Hemorrhage in early pregnancy, unspecified: Secondary | ICD-10-CM | POA: Diagnosis not present

## 2023-04-11 DIAGNOSIS — Z3A01 Less than 8 weeks gestation of pregnancy: Secondary | ICD-10-CM | POA: Insufficient documentation

## 2023-04-11 DIAGNOSIS — N939 Abnormal uterine and vaginal bleeding, unspecified: Secondary | ICD-10-CM

## 2023-04-11 LAB — URINALYSIS, ROUTINE W REFLEX MICROSCOPIC
Bilirubin Urine: NEGATIVE
Glucose, UA: NEGATIVE mg/dL
Ketones, ur: NEGATIVE mg/dL
Nitrite: NEGATIVE
Protein, ur: NEGATIVE mg/dL
Specific Gravity, Urine: 1.014 (ref 1.005–1.030)
pH: 6 (ref 5.0–8.0)

## 2023-04-11 LAB — COMPREHENSIVE METABOLIC PANEL
ALT: 23 U/L (ref 0–44)
AST: 20 U/L (ref 15–41)
Albumin: 4.1 g/dL (ref 3.5–5.0)
Alkaline Phosphatase: 52 U/L (ref 38–126)
Anion gap: 10 (ref 5–15)
BUN: 10 mg/dL (ref 6–20)
CO2: 21 mmol/L — ABNORMAL LOW (ref 22–32)
Calcium: 8.8 mg/dL — ABNORMAL LOW (ref 8.9–10.3)
Chloride: 104 mmol/L (ref 98–111)
Creatinine, Ser: 0.6 mg/dL (ref 0.44–1.00)
GFR, Estimated: >60 mL/min (ref >60)
Glucose, Bld: 132 mg/dL — ABNORMAL HIGH (ref 70–99)
Potassium: 3.3 mmol/L — ABNORMAL LOW (ref 3.5–5.1)
Sodium: 135 mmol/L (ref 135–145)
Total Bilirubin: 0.5 mg/dL (ref 0.3–1.2)
Total Protein: 7.7 g/dL (ref 6.5–8.1)

## 2023-04-11 LAB — CBC
HCT: 40.2 % (ref 36.0–46.0)
Hemoglobin: 13.4 g/dL (ref 12.0–15.0)
MCH: 29.1 pg (ref 26.0–34.0)
MCHC: 33.3 g/dL (ref 30.0–36.0)
MCV: 87.2 fL (ref 80.0–100.0)
Platelets: 271 10*3/uL (ref 150–400)
RBC: 4.61 MIL/uL (ref 3.87–5.11)
RDW: 13.3 % (ref 11.5–15.5)
WBC: 9.9 10*3/uL (ref 4.0–10.5)
nRBC: 0 % (ref 0.0–0.2)

## 2023-04-11 LAB — HCG, QUANTITATIVE, PREGNANCY: hCG, Beta Chain, Quant, S: 37840 m[IU]/mL — ABNORMAL HIGH (ref <5)

## 2023-04-11 LAB — ABO/RH: ABO/RH(D): O POS

## 2023-04-11 LAB — MAGNESIUM: Magnesium: 1.9 mg/dL (ref 1.7–2.4)

## 2023-04-11 MED ORDER — CEFADROXIL 500 MG PO CAPS: 500.0000 mg | ORAL_CAPSULE | Freq: Two times a day (BID) | ORAL | 0 refills | Status: DC

## 2023-04-11 MED ORDER — POTASSIUM CHLORIDE CRYS ER 20 MEQ PO TBCR
40.0000 meq | EXTENDED_RELEASE_TABLET | Freq: Once | ORAL | Status: AC
  Administered 2023-04-11: 40 meq via ORAL
  Filled 2023-04-11: qty 2

## 2023-04-11 MED ORDER — POTASSIUM CHLORIDE CRYS ER 20 MEQ PO TBCR: 20.0000 meq | EXTENDED_RELEASE_TABLET | Freq: Every day | ORAL | 0 refills | Status: DC

## 2023-04-11 NOTE — ED Provider Notes (Signed)
[place] EMERGENCY DEPARTMENT AT [facility] Provider Note   CSN: [id number] Arrival date & time: 04/11/23  1153     History  Chief Complaint  Patient presents with   Vaginal Bleeding    Alexandra Jones is a 27 y.o. female.   Vaginal Bleeding   27 year old female presents emergency department with complaints of vaginal bleeding, cramping pelvic pain.  Patient states she is 7 weeks and 3 days pregnant with her first pregnancy.  States that she has been having some cramping type lower abdominal pain for the past few days but pain slightly worsened yesterday into today.  Reported some radiation to her lower back in the middle.  Last night, noted some bright red vaginal spotting and noted a little bit more bleeding earlier this morning before it stopped.  Denies any fever, chills, chest pain, shortness of breath, nausea, vomiting, vaginal discharge, urinary symptoms, change in bowel habits.  States she called a nurse at her OB/GYN's office who told her to come to the emergency department for further assessment.  Past medical history significant for hypertension, GERD, anxiety, obesity, hyperlipidemia  Home Medications Prior to Admission medications   Medication Sig Start Date End Date Taking? Authorizing Provider  cefadroxil (DURICEF) 500 MG capsule Take 1 capsule (500 mg total) by mouth 2 (two) times daily. 04/11/23  Yes Sherian Maroon A, PA  potassium chloride SA (KLOR-CON M) 20 MEQ tablet Take 1 tablet (20 mEq total) by mouth daily. 04/11/23  Yes Sherian Maroon A, PA  acetaminophen (TYLENOL) 500 MG tablet Take 500 mg by mouth every 6 (six) hours as needed.    [provider]  amLODipine (NORVASC) 10 MG tablet Take 1 tablet (10 mg total) by mouth daily. 05/13/22   Tommie Sams, DO  Biotin 5 MG CAPS Take by mouth.    [provider]  calcium carbonate (TUMS - DOSED IN MG ELEMENTAL CALCIUM) 500 MG chewable tablet Chew 1 tablet by mouth as needed.    [provider]  Emollient (COLLAGEN EX) Apply topically. 1  scoop    [provider]  escitalopram (LEXAPRO) 10 MG tablet Take 1 tablet (10 mg total) by mouth daily. 03/31/23   Whitmire, Thermon Leyland, FNP  escitalopram (LEXAPRO) 5 MG tablet Take 1 tablet (5 mg total) by mouth daily. 03/31/23   Whitmire, Thermon Leyland, FNP  loratadine (CLARITIN) 10 MG tablet Take 10 mg by mouth daily.    [provider]  Melatonin-Pyridoxine (MELATONIN TR) 5-10 MG TBCR Take by mouth.    [provider]  metFORMIN (GLUCOPHAGE) 500 MG tablet Take 1 tablet (500 mg total) by mouth 2 (two) times daily with a meal. 03/31/23   Whitmire, Thermon Leyland, FNP  Multiple Vitamin (MULTI-VITAMINS) TABS Take 1 tablet by mouth.    [provider]  nystatin (MYCOSTATIN/NYSTOP) powder Apply 1 Application topically 3 (three) times daily. 03/13/23   Adline Potter, NP  Probiotic Product (PROBIOTIC PO) Take by mouth.    [provider]      Allergies    Hydrocodone and Penicillins    Review of Systems   Review of Systems  Genitourinary:  Positive for vaginal bleeding.  All other systems reviewed and are negative.   Physical Exam Updated Vital Signs BP 122/68 (BP Location: Right Arm)   Pulse 82   Temp 98.3 F (36.8 C) (Oral)   Resp 18   Ht 5\' 5"  (1.651 m)   Wt 113.9 kg   LMP  02/18/2023   SpO2 99%   BMI 41.77 kg/m  Physical Exam Vitals and nursing note reviewed.  Constitutional:      General: She is not in acute distress.    Appearance: She is well-developed.  HENT:     Head: Normocephalic and atraumatic.  Eyes:     Conjunctiva/sclera: Conjunctivae normal.  Cardiovascular:     Rate and Rhythm: Normal rate and regular rhythm.     Heart sounds: No murmur heard. Pulmonary:     Effort: Pulmonary effort is normal. No respiratory distress.     Breath sounds: Normal breath sounds.  Abdominal:     Palpations: Abdomen is soft.     Tenderness: There is abdominal tenderness. There is no right  CVA tenderness, left CVA tenderness or guarding.     Comments: Mild tenderness in suprapubic region.  Musculoskeletal:        General: No swelling.     Cervical back: Neck supple.     Right lower leg: No edema.     Left lower leg: No edema.  Skin:    General: Skin is warm and dry.     Capillary Refill: Capillary refill takes less than 2 seconds.  Neurological:     Mental Status: She is alert.  Psychiatric:        Mood and Affect: Mood normal.     ED Results / Procedures / Treatments   Labs (all labs ordered are listed, but only abnormal results are displayed) Labs Reviewed  HCG, QUANTITATIVE, PREGNANCY - Abnormal; Notable for the following components:      Result Value   hCG, Beta Chain, Quant, S 37,840 (*)    All other components within normal limits  URINALYSIS, ROUTINE W REFLEX MICROSCOPIC - Abnormal; Notable for the following components:   APPearance HAZY (*)    Hgb urine dipstick LARGE (*)    Leukocytes,Ua MODERATE (*)    Bacteria, UA RARE (*)    All other components within normal limits  COMPREHENSIVE METABOLIC PANEL - Abnormal; Notable for the following components:   Potassium 3.3 (*)    CO2 21 (*)    Glucose, Bld 132 (*)    Calcium 8.8 (*)    All other components within normal limits  CBC  MAGNESIUM  ABO/RH    EKG None  Radiology US OB LESS THAN 14 WEEKS WITH OB TRANSVAGINAL  Result Date: 04/11/2023 CLINICAL DATA:  Pelvic pain and cramping. Vaginal spotting. Positive pregnancy test. EXAM: OBSTETRIC <14 WK [place] AND TRANSVAGINAL OB US TECHNIQUE: Both transabdominal and transvaginal ultrasound examinations were performed for complete evaluation of the gestation as well as the maternal uterus, adnexal regions, and pelvic cul-de-sac. Transvaginal technique was performed to assess early pregnancy. COMPARISON:  None Available. FINDINGS: Intrauterine gestational sac: Single Yolk sac:  Visualized Embryo:  Visualized Cardiac Activity: Visualized Heart Rate: 157 bpm CRL:   11.1 mm   7 w   2 d                  [place] EDC: 11/26/2023 Subchorionic hemorrhage:  None visualized. Maternal uterus/adnexae: Unremarkable.  No free fluid. IMPRESSION: Single living intrauterine gestation at estimated 7 week 2 day gestational age by crown-rump length. Electronically Signed   By: Kennith Center M.D.   On: 04/11/2023 15:33    Procedures Procedures    Medications Ordered in ED Medications  potassium chloride SA (KLOR-CON M) CR tablet 40 mEq (40 mEq Oral Given 04/11/23 1315)    ED Course/ Medical Decision  Making/ A&P                                 Medical Decision Making Amount and/or Complexity of Data Reviewed Labs: ordered. Radiology: ordered.  Risk Prescription drug management.   This patient presents to the ED for concern of vaginal bleeding/pelvic pain, this involves an extensive number of treatment options, and is a complaint that carries with it a high risk of complications and morbidity.  The differential diagnosis includes but is not limited to, threatened miscarriage, incomplete miscarriage, normal bleeding from an early trimester pregnancy, ectopic pregnancy, , blighted ovum, vaginal/cervical trauma, subchorionic hemorrhage/hematoma, etc.  Co morbidities that complicate the patient evaluation  See HPI   Additional history obtained:  Additional history obtained from EMR External records from outside source obtained and reviewed including hospital records   Lab Tests:  I Ordered, and personally interpreted labs.  The pertinent results include: No leukocytosis.  No evidence of anemia.  Platelets within range.  Mild hypokalemia, hypocalcemia and decrease in bicarb of 3.3, 8.8 and 21 respectively.  No transaminitis.  No renal dysfunction.  UA significant for rare bacteria, 21-50 WBCs with moderate leukocytes.  Rh+.   Imaging Studies ordered:  I ordered imaging studies including ultrasound I independently visualized and interpreted imaging which showed  single live intrauterine pregnancy measuring 7 weeks and 2 days with fetal heart rate in the 150s I agree with the radiologist interpretation   Cardiac Monitoring: / EKG:  The patient was maintained on a cardiac monitor.  I personally viewed and interpreted the cardiac monitored which showed an underlying rhythm of: Rhythm   Consultations Obtained:  N/a   Problem List / ED Course / Critical interventions / Medication management  Intrauterine pregnancy, vaginal bleeding I ordered medication including potassium chloride   Reevaluation of the patient after these medicines showed that the patient improved I have reviewed the patients home medicines and have made adjustments as needed   Social Determinants of Health:  Denies tobacco, illicit drug use   Test / Admission - Considered:  Intrauterine pregnancy, vaginal bleeding Vitals signs within normal range and stable throughout visit. Laboratory/imaging studies significant for: See above 27 year old female presents emergency department with complaints of pelvic pain, vaginal bleeding with end of her last menstrual period 7 weeks and 3 days ago with positive at-home pregnancy test.  On exam, patient with minimal amounts of suprapubic tenderness.  Workup today overall reassuring.  Ultrasound showed live intrauterine pregnancy without complication at this time.  Patient ABO positive so did not administer RhoGAM.  No significant signs of anemia.  Patient's bleeding stopped earlier this morning with no recurrence since it stopped; discussion was had regarding obtaining pelvic exam but patient declined.  Will recommend continued prenatal care and follow-up with OB/GYN in the outpatient setting.  Further workup deemed unnecessary at this time.  Treatment plan discussed at length with patient and she acknowledged understanding was agreeable to said plan.  Patient overall well-appearing, afebrile in no acute distress. Worrisome signs and symptoms  were discussed with the patient, and the patient acknowledged understanding to return to the ED if noticed. Patient was stable upon discharge.          Final Clinical Impression(s) / ED Diagnoses Final diagnoses:  Intrauterine pregnancy  Vaginal bleeding    Rx / DC Orders ED Discharge Orders          Ordered  potassium chloride SA (KLOR-CON M) 20 MEQ tablet  Daily        04/11/23 1549    cefadroxil (DURICEF) 500 MG capsule  2 times daily        04/11/23 1549              Peter Garter, [place] 04/11/23 1739    Bethann Berkshire, MD 04/14/23 1601

## 2023-04-11 NOTE — Discharge Instructions (Addendum)
As discussed, ultrasound showed a live intrauterine pregnancy measuring 7 weeks and 2 day.  Your baby's heart rate is normal and there is no evidence of bleed inside gestational sac.  Bleeding could be secondary to normal spotting during first trimester.  Recommend follow-up with primary care for reassessment of your symptoms.  Continue prenatal care at home.  Please not hesitate to return to emergency department if there are worrisome signs and symptoms we discussed become apparent.

## 2023-04-11 NOTE — ED Notes (Signed)
Patient transported to Ultrasound 

## 2023-04-11 NOTE — ED Triage Notes (Signed)
[redacted] weeks pregnant Cramping and bleeding started this morning Spotting and bright red G1P0

## 2023-04-11 NOTE — ED Notes (Signed)
Chaperoned to ultrasound

## 2023-04-22 ENCOUNTER — Other Ambulatory Visit: Payer: Commercial Managed Care - PPO

## 2023-04-30 ENCOUNTER — Ambulatory Visit (INDEPENDENT_AMBULATORY_CARE_PROVIDER_SITE_OTHER): Payer: Commercial Managed Care - PPO | Admitting: Family Medicine

## 2023-05-01 ENCOUNTER — Other Ambulatory Visit (INDEPENDENT_AMBULATORY_CARE_PROVIDER_SITE_OTHER): Payer: Self-pay

## 2023-05-06 ENCOUNTER — Encounter: Payer: Self-pay | Admitting: Family Medicine

## 2023-05-10 ENCOUNTER — Other Ambulatory Visit: Payer: Self-pay | Admitting: Family Medicine

## 2023-05-10 DIAGNOSIS — F3289 Other specified depressive episodes: Secondary | ICD-10-CM

## 2023-05-11 ENCOUNTER — Other Ambulatory Visit: Payer: Self-pay | Admitting: Family Medicine

## 2023-05-11 ENCOUNTER — Other Ambulatory Visit: Payer: Self-pay | Admitting: Adult Health

## 2023-05-11 ENCOUNTER — Other Ambulatory Visit (HOSPITAL_COMMUNITY): Payer: Self-pay

## 2023-05-11 DIAGNOSIS — F3289 Other specified depressive episodes: Secondary | ICD-10-CM

## 2023-05-11 MED ORDER — ESCITALOPRAM OXALATE 5 MG PO TABS
5.0000 mg | ORAL_TABLET | Freq: Every day | ORAL | 0 refills | Status: DC
  Filled 2023-05-11: qty 90, 90d supply, fill #0

## 2023-05-11 MED ORDER — ESCITALOPRAM OXALATE 10 MG PO TABS
10.0000 mg | ORAL_TABLET | Freq: Every day | ORAL | 0 refills | Status: DC
  Filled 2023-05-11: qty 90, 90d supply, fill #0

## 2023-05-12 ENCOUNTER — Other Ambulatory Visit: Payer: Self-pay | Admitting: Adult Health

## 2023-05-12 ENCOUNTER — Other Ambulatory Visit: Payer: Self-pay

## 2023-05-12 ENCOUNTER — Other Ambulatory Visit (HOSPITAL_COMMUNITY): Payer: Self-pay

## 2023-05-12 DIAGNOSIS — F3289 Other specified depressive episodes: Secondary | ICD-10-CM

## 2023-05-12 MED ORDER — ESCITALOPRAM OXALATE 10 MG PO TABS
10.0000 mg | ORAL_TABLET | Freq: Every day | ORAL | 1 refills | Status: DC
  Filled 2023-05-12 – 2023-08-11 (×2): qty 90, 90d supply, fill #0
  Filled 2023-11-05: qty 90, 90d supply, fill #1

## 2023-05-12 MED ORDER — ESCITALOPRAM OXALATE 5 MG PO TABS
5.0000 mg | ORAL_TABLET | Freq: Every day | ORAL | 1 refills | Status: DC
  Filled 2023-05-12 – 2023-08-11 (×2): qty 90, 90d supply, fill #0
  Filled 2023-11-05: qty 90, 90d supply, fill #1

## 2023-05-12 NOTE — Progress Notes (Signed)
Taking 15 mg lexapro, will refill

## 2023-05-15 ENCOUNTER — Ambulatory Visit (INDEPENDENT_AMBULATORY_CARE_PROVIDER_SITE_OTHER): Payer: Commercial Managed Care - PPO | Admitting: Family Medicine

## 2023-05-15 ENCOUNTER — Encounter: Payer: 59 | Admitting: Family Medicine

## 2023-05-15 ENCOUNTER — Other Ambulatory Visit (HOSPITAL_COMMUNITY): Payer: Self-pay

## 2023-05-15 ENCOUNTER — Encounter: Payer: Self-pay | Admitting: Advanced Practice Midwife

## 2023-05-15 VITALS — BP 124/76 | Ht 65.0 in | Wt 257.4 lb

## 2023-05-15 DIAGNOSIS — Z0000 Encounter for general adult medical examination without abnormal findings: Secondary | ICD-10-CM

## 2023-05-15 DIAGNOSIS — O0990 Supervision of high risk pregnancy, unspecified, unspecified trimester: Secondary | ICD-10-CM | POA: Insufficient documentation

## 2023-05-15 DIAGNOSIS — H6063 Unspecified chronic otitis externa, bilateral: Secondary | ICD-10-CM | POA: Diagnosis not present

## 2023-05-15 MED ORDER — CIPROFLOXACIN-DEXAMETHASONE 0.3-0.1 % OT SUSP
4.0000 [drp] | Freq: Two times a day (BID) | OTIC | 0 refills | Status: DC
  Filled 2023-05-15: qty 7.5, 10d supply, fill #0

## 2023-05-15 NOTE — Patient Instructions (Signed)
Follow up annually.  Call with concerns.  Take care  Dr. Kayline Sheer  

## 2023-05-15 NOTE — Progress Notes (Unsigned)
Subjective:  Patient ID: Alexandra Jones, female    DOB: Aug 13, 1995  Age: 27 y.o. MRN: [id number]  CC: Chief Complaint  Patient presents with   Annual Exam    Currently pregnant and receiving care through OB Drainage and odor from  ears    HPI:  27 year old female who is currently [redacted] weeks pregnant presents for an annual physical.   Patient not planning on taking COVID vaccine at this time. Flu shot will be given at work later in the month. Hold off on Tdap as she will get one later in the pregnancy. Also will get HIV screening during pregnancy. Recommend OB GYN obtain Hep C testing along with other prenatal testing.   She has some nausea but it improves with food. BP stable. She is on Amlodipine (okay to be used per Lexicomp).  Continues to have issues with ears - itching and discharge. Previously seen by ENT.   Patient Active Problem List   Diagnosis Date Noted   Annual physical exam 05/17/2023   Chronic otitis externa 05/17/2023   Supervision of high-risk pregnancy 05/15/2023   Depression 06/26/2022   Essential hypertension 05/13/2022   Mixed hyperlipidemia 05/13/2022   Vitamin D deficiency 03/13/2022   Pre-diabetes 10/02/2020   Class 3 severe obesity with serious comorbidity and body mass index (BMI) of 40.0 to 44.9 in adult [facility]) 10/01/2020   GERD (gastroesophageal reflux disease)    Anxiety disorder 03/14/2019    Social Hx   Social History   Socioeconomic History   Marital status: Significant Other    Spouse name: Not on file   Number of children: Not on file   Years of education: Not on file   Highest education level: Not on file  Occupational History   Occupation: Acupuncturist  Tobacco Use   Smoking status: Never   Smokeless tobacco: Never  Vaping Use   Vaping status: Never Used  Substance and Sexual Activity   Alcohol use: Not Currently    Comment: occasional   Drug use: No   Sexual activity: Yes    Birth control/protection: None  Other  Topics Concern   Not on file  Social History Narrative   Not on file   Social Determinants of Health   Financial Resource Strain: Medium Risk (02/04/2022)   Overall Financial Resource Strain (CARDIA)    Difficulty of Paying Living Expenses: Somewhat hard  Food Insecurity: No Food Insecurity (02/04/2022)   Hunger Vital Sign    Worried About Running Out of Food in the Last Year: Never true    Ran Out of Food in the Last Year: Never true  Transportation Needs: No Transportation Needs (02/04/2022)   PRAPARE - Administrator, Civil Service (Medical): No    Lack of Transportation (Non-Medical): No  Physical Activity: Inactive (02/04/2022)   Exercise Vital Sign    Days of Exercise per Week: 0 days    Minutes of Exercise per Session: 0 min  Stress: Stress Concern Present (02/04/2022)   [employer] of Occupational Health - Occupational Stress Questionnaire    Feeling of Stress : Rather much  Social Connections: Moderately Isolated (02/04/2022)   Social Connection and Isolation Panel [NHANES]    Frequency of Communication with Friends and Family: Three times a week    Frequency of Social Gatherings with Friends and Family: Once a week    Attends Religious Services: 1 to 4 times per year    Active Member of Clubs or Organizations: No  Attends Banker Meetings: Never    Marital Status: Never married    Review of Systems Per HPI  Objective:  BP 124/76   Ht 5\' 5"  (1.651 m)   Wt 257 lb 6.4 oz (116.8 kg)   LMP 02/18/2023   BMI 42.83 kg/m      05/15/2023    3:40 PM 04/11/2023    3:37 PM 04/11/2023   12:00 PM  BP/Weight  Systolic BP 124 122 126  Diastolic BP 76 68 78  Wt. (Lbs) 257.4    BMI 42.83 kg/m2      Physical Exam Vitals and nursing note reviewed.  Constitutional:      General: She is not in acute distress.    Appearance: Normal appearance. She is obese.  HENT:     Head: Normocephalic and atraumatic.     Right Ear: Tympanic membrane normal.      Left Ear: Tympanic membrane normal.     Ears:     Comments: Mild erythema of the canals bilaterally.  Eyes:     General:        Right eye: No discharge.        Left eye: No discharge.     Conjunctiva/sclera: Conjunctivae normal.  Cardiovascular:     Rate and Rhythm: Normal rate and regular rhythm.  Pulmonary:     Effort: Pulmonary effort is normal.     Breath sounds: Normal breath sounds. No wheezing, rhonchi or rales.  Abdominal:     Palpations: Abdomen is soft.     Tenderness: There is no abdominal tenderness.  Neurological:     General: No focal deficit present.     Mental Status: She is alert.  Psychiatric:        Mood and Affect: Mood normal.        Behavior: Behavior normal.     Lab Results  Component Value Date   WBC 9.9 04/11/2023   HGB 13.4 04/11/2023   HCT 40.2 04/11/2023   PLT 271 04/11/2023   GLUCOSE 132 (H) 04/11/2023   CHOL 179 03/03/2023   TRIG 225 (H) 03/03/2023   HDL 38 (L) 03/03/2023   LDLCALC 102 (H) 03/03/2023   ALT 23 04/11/2023   AST 20 04/11/2023   NA 135 04/11/2023   K 3.3 (L) 04/11/2023   CL 104 04/11/2023   CREATININE 0.60 04/11/2023   BUN 10 04/11/2023   CO2 21 (L) 04/11/2023   TSH 1.770 09/30/2022   HGBA1C 5.6 03/03/2023     Assessment & Plan:   Problem List Items Addressed This Visit       Nervous and Auditory   Chronic otitis externa    Treating with Ciprodex.         Other   Annual physical exam - Primary    Doing well.  HTN stable on Amlodipine. Declines COVID vaccine. Flu vaccine through work. HIV testing will be obtained with prenatal blood work. Recommend Hep C screening at that time. Tdap later in pregnancy.       Meds ordered this encounter  Medications   ciprofloxacin-dexamethasone (CIPRODEX) OTIC suspension    Sig: Place 4 drops into both ears 2 (two) times daily.    Dispense:  7.5 mL    Refill:  0    Follow-up:  Annually  Everlene Other DO [facility]

## 2023-05-17 DIAGNOSIS — H6060 Unspecified chronic otitis externa, unspecified ear: Secondary | ICD-10-CM | POA: Insufficient documentation

## 2023-05-17 DIAGNOSIS — Z0000 Encounter for general adult medical examination without abnormal findings: Secondary | ICD-10-CM | POA: Insufficient documentation

## 2023-05-17 NOTE — Assessment & Plan Note (Signed)
Treating with Ciprodex. 

## 2023-05-17 NOTE — Assessment & Plan Note (Signed)
Doing well.  HTN stable on Amlodipine. Declines COVID vaccine. Flu vaccine through work. HIV testing will be obtained with prenatal blood work. Recommend Hep C screening at that time. Tdap later in pregnancy.

## 2023-05-18 ENCOUNTER — Other Ambulatory Visit (HOSPITAL_COMMUNITY): Payer: Self-pay

## 2023-05-19 ENCOUNTER — Other Ambulatory Visit: Payer: Self-pay | Admitting: Obstetrics & Gynecology

## 2023-05-19 DIAGNOSIS — Z3682 Encounter for antenatal screening for nuchal translucency: Secondary | ICD-10-CM

## 2023-05-20 ENCOUNTER — Other Ambulatory Visit: Payer: Self-pay

## 2023-05-20 ENCOUNTER — Encounter: Payer: Self-pay | Admitting: Advanced Practice Midwife

## 2023-05-20 ENCOUNTER — Other Ambulatory Visit (INDEPENDENT_AMBULATORY_CARE_PROVIDER_SITE_OTHER): Payer: Commercial Managed Care - PPO

## 2023-05-20 ENCOUNTER — Other Ambulatory Visit (HOSPITAL_COMMUNITY): Payer: Self-pay

## 2023-05-20 ENCOUNTER — Encounter: Payer: Commercial Managed Care - PPO | Admitting: *Deleted

## 2023-05-20 ENCOUNTER — Ambulatory Visit (INDEPENDENT_AMBULATORY_CARE_PROVIDER_SITE_OTHER): Payer: Commercial Managed Care - PPO | Admitting: Advanced Practice Midwife

## 2023-05-20 VITALS — BP 120/86 | HR 67 | Wt 258.0 lb

## 2023-05-20 DIAGNOSIS — Z1332 Encounter for screening for maternal depression: Secondary | ICD-10-CM | POA: Diagnosis not present

## 2023-05-20 DIAGNOSIS — O0991 Supervision of high risk pregnancy, unspecified, first trimester: Secondary | ICD-10-CM

## 2023-05-20 DIAGNOSIS — Z363 Encounter for antenatal screening for malformations: Secondary | ICD-10-CM | POA: Diagnosis not present

## 2023-05-20 DIAGNOSIS — Z3A13 13 weeks gestation of pregnancy: Secondary | ICD-10-CM

## 2023-05-20 DIAGNOSIS — O0992 Supervision of high risk pregnancy, unspecified, second trimester: Secondary | ICD-10-CM

## 2023-05-20 DIAGNOSIS — I10 Essential (primary) hypertension: Secondary | ICD-10-CM | POA: Diagnosis not present

## 2023-05-20 DIAGNOSIS — Z3682 Encounter for antenatal screening for nuchal translucency: Secondary | ICD-10-CM | POA: Diagnosis not present

## 2023-05-20 MED ORDER — ASPIRIN 81 MG PO CHEW
162.0000 mg | CHEWABLE_TABLET | Freq: Every day | ORAL | 7 refills | Status: DC
  Filled 2023-05-20: qty 60, 30d supply, fill #0
  Filled 2023-06-17: qty 60, 30d supply, fill #1
  Filled 2023-07-18: qty 60, 30d supply, fill #2
  Filled 2023-08-11 – 2023-08-23 (×2): qty 60, 30d supply, fill #3
  Filled 2023-09-29: qty 60, 30d supply, fill #4
  Filled 2023-10-26: qty 60, 30d supply, fill #5

## 2023-05-20 NOTE — Progress Notes (Signed)
INITIAL OBSTETRICAL VISIT Patient name: Alexandra Jones MRN [id number]  Date of birth: [birth year] Chief Complaint:   Initial Prenatal Visit  History of Present Illness:   Alexandra Jones is a 27 y.o. G42P0000 Caucasian female at [redacted]w[redacted]d by LMP c/w u/s at 7.2 weeks with an Estimated Date of Delivery: 11/25/23 being seen today for her initial obstetrical visit.   Patient's last menstrual period was 02/18/2023. Her obstetrical history is significant for primigravida.   Today she reports fatigue.  Last pap June 2023. Results were: NILM w/ HRHPV negative     05/20/2023   10:36 AM 05/15/2023    3:44 PM 03/13/2023    9:14 AM 10/07/2022   11:07 AM 05/13/2022    8:30 AM  Depression screen PHQ 2/9  Decreased Interest 1 1 1 1  0  Down, Depressed, Hopeless 1 2 1  0 0  PHQ - 2 Score 2 3 2 1  0  Altered sleeping 0 2 1 1    Tired, decreased energy 2 2 2 1    Change in appetite 0 0 1 0   Feeling bad or failure about yourself  1 1 1  0   Trouble concentrating 0 0 0 0   Moving slowly or fidgety/restless 0 0 0 0   Suicidal thoughts 0 0 0 0   PHQ-9 Score 5 8 7 3    Difficult doing work/chores  Somewhat difficult Not difficult at all Somewhat difficult         05/20/2023   10:36 AM 05/15/2023    3:44 PM 10/07/2022   11:07 AM 02/04/2022    8:36 AM  GAD 7 : Generalized Anxiety Score  Nervous, Anxious, on Edge 2 2 1 2   Control/stop worrying 1 2 2 2   Worry too much - different things 2 2 2 2   Trouble relaxing 0 1 1 1   Restless 0 0 1 1  Easily annoyed or irritable 1 2 0 1  Afraid - awful might happen 1 1 2 2   Total GAD 7 Score 7 10 9 11   Anxiety Difficulty  Somewhat difficult Somewhat difficult      Review of Systems:   Pertinent items are noted in HPI Denies cramping/contractions, leakage of fluid, vaginal bleeding, abnormal vaginal discharge w/ itching/odor/irritation, headaches, visual changes, shortness of breath, chest pain, abdominal pain, severe nausea/vomiting, or problems with urination or bowel  movements unless otherwise stated above.  Pertinent History Reviewed:  Reviewed past medical,surgical, social, obstetrical and family history.  Reviewed problem list, medications and allergies. OB History  Gravida Para Term Preterm AB Living  1 0 0 0 0 0  SAB IAB Ectopic Multiple Live Births  0 0 0 0 0    # Outcome Date GA Lbr Len/2nd Weight Sex Type Anes PTL Lv  1 Current            Physical Assessment:   Vitals:   05/20/23 1012  BP: 120/86  Pulse: 67  Weight: 258 lb (117 kg)  Body mass index is 42.93 kg/m.       Physical Examination:  General appearance - well appearing, and in no distress  Mental status - alert, oriented to person, place, and time  Psych:  She has a normal mood and affect  Skin - warm and dry, normal color, no suspicious lesions noted  Chest - effort normal, all lung fields clear to auscultation bilaterally  Heart - normal rate and regular rhythm  Abdomen - soft, nontender  Extremities:  No swelling or  varicosities noted  Pelvic - not indicated  Thin prep pap is not done   TODAY'S NT [place] 13 wks,measurements c/w dates,FHR 150 bpm,posterior placenta,normal ovaries,NB present,NT 1.9 mm   No results found for this or any previous visit (from the past 24 hour(s)).  Assessment & Plan:  1) High-Risk Pregnancy G1P0000 at [redacted]w[redacted]d with an Estimated Date of Delivery: 11/25/23   2) Initial OB visit  3) cHTN, Norvasc 10mg  daily; rx bASA 162mg ; baseline labs today  4) Pre DM, started on Metformin 500mg  daily about 1.69yrs ago based on A1c of 5.8 along with referral to [facility] to try to get A1c down to nl levels; most recently was 5.6 on 7/23 with Metformin; per JO, rx meter/strips and bring log of qid values to determine plan of care  5) Anx/dep, stable on Lexapro  Meds:  Meds ordered this encounter  Medications   aspirin 81 MG chewable tablet    Sig: Chew 2 tablets (162 mg total) by mouth daily.    Dispense:  60 tablet    Refill:  7     Order Specific Question:   Supervising Provider    Answer:   Myna Hidalgo [[id number]]    Initial labs obtained Continue prenatal vitamins Reviewed n/v relief measures and warning s/s to report Reviewed recommended weight gain based on pre-gravid BMI Encouraged well-balanced diet Genetic & carrier screening discussed: requests Panorama and NT/IT, requests Horizon  Ultrasound discussed; fetal survey: requested CCNC completed> form faxed if has or is planning to apply for medicaid The nature of [facility] [employer] with multiple MDs and other Advanced Practice Providers was explained to patient; also emphasized that fellows, residents, and students are part of our team. Does have home bp cuff. Office bp cuff given: no. Rx sent: no. Check bp weekly, let us know if consistently >140/90.   Indications for ASA therapy (per uptodate) One of the following: CHTN Yes  Indications for early A1C (can't check today as had one 03/03/23 of 5.6) (per uptodate)  Follow-up: Return for HROB & 2nd IT in 4wks; HROB & anatomy u/s in 7wks.   Orders Placed This Encounter  Procedures   Urine Culture   GC/Chlamydia Probe Amp   US OB Comp + 14 Wk   Integrated 1   PANORAMA PRENATAL TEST   Protein / creatinine ratio, urine   Comprehensive metabolic panel   CBC/D/Plt+RPR+Rh+ABO+RubIgG...    Arabella Merles CNM 05/20/2023 11:10 AM

## 2023-05-20 NOTE — Patient Instructions (Signed)
Alexandra Jones, thank you for choosing our office today! We appreciate the opportunity to meet your healthcare needs. You may receive a short survey by mail, e-mail, or through [employer]. If you are happy with your care we would appreciate if you could take just a few minutes to complete the survey questions. We read all of your comments and take your feedback very seriously. Thank you again for choosing our office.  Center for [employer] [facility] Team at [facility]  [facility] ([address] [place], [place] [address]) Entrance C, located off of [address] Free 24/7 valet parking   Nausea & Vomiting Have saltine crackers or pretzels by your bed and eat a few bites before you raise your head out of bed in the morning Eat small frequent meals throughout the day instead of large meals Drink plenty of fluids throughout the day to stay hydrated, just don't drink a lot of fluids with your meals.  This can make your stomach fill up faster making you feel sick Do not brush your teeth right after you eat Products with real ginger are good for nausea, like ginger ale and ginger hard candy Make sure it says made with real ginger! Sucking on sour candy like lemon heads is also good for nausea If your prenatal vitamins make you nauseated, take them at night so you will sleep through the nausea Sea Bands If you feel like you need medicine for the nausea & vomiting please let us know If you are unable to keep any fluids or food down please let us know   Constipation Drink plenty of fluid, preferably water, throughout the day Eat foods high in fiber such as fruits, vegetables, and grains Exercise, such as walking, is a good way to keep your bowels regular Drink warm fluids, especially warm prune juice, or decaf coffee Eat a 1/2 cup of real oatmeal (not instant), 1/2 cup applesauce, and 1/2-1 cup warm prune juice every day If needed, you may take Colace (docusate sodium) stool softener  once or twice a day to help keep the stool soft.  If you still are having problems with constipation, you may take Miralax once daily as needed to help keep your bowels regular.   Home Blood Pressure Monitoring for Patients   Your provider has recommended that you check your blood pressure (BP) at least once a week at home. If you do not have a blood pressure cuff at home, one will be provided for you. Contact your provider if you have not received your monitor within 1 week.   Helpful Tips for Accurate Home Blood Pressure Checks  Don't smoke, exercise, or drink caffeine 30 minutes before checking your BP Use the restroom before checking your BP (a full bladder can raise your pressure) Relax in a comfortable upright chair Feet on the ground Left arm resting comfortably on a flat surface at the level of your heart Legs uncrossed Back supported Sit quietly and don't talk Place the cuff on your bare arm Adjust snuggly, so that only two fingertips can fit between your skin and the top of the cuff Check 2 readings separated by at least one minute Keep a log of your BP readings For a visual, please reference this diagram: http://ccnc.care/bpdiagram  Provider Name: [facility]     Phone: [phone]  Zone 1: ALL CLEAR  Continue to monitor your symptoms:  BP reading is less than 140 (top number) or less than 90 (bottom  number)  No right upper stomach pain No headaches or seeing spots No feeling nauseated or throwing up No swelling in face and hands  Zone 2: CAUTION Call your doctor's office for any of the following:  BP reading is greater than 140 (top number) or greater than 90 (bottom number)  Stomach pain under your ribs in the middle or right side Headaches or seeing spots Feeling nauseated or throwing up Swelling in face and hands  Zone 3: EMERGENCY  Seek immediate medical care if you have any of the following:  BP reading is greater than160 (top number) or greater than  110 (bottom number) Severe headaches not improving with Tylenol Serious difficulty catching your breath Any worsening symptoms from Zone 2    First Trimester of Pregnancy The first trimester of pregnancy is from week 1 until the end of week 12 (months 1 through 3). A week after a sperm fertilizes an egg, the egg will implant on the wall of the uterus. This embryo will begin to develop into a baby. Genes from you and your partner are forming the baby. The female genes determine whether the baby is a boy or a girl. At 6-8 weeks, the eyes and face are formed, and the heartbeat can be seen on ultrasound. At the end of 12 weeks, all the baby's organs are formed.  Now that you are pregnant, you will want to do everything you can to have a healthy baby. Two of the most important things are to get good prenatal care and to follow your health care provider's instructions. Prenatal care is all the medical care you receive before the baby's birth. This care will help prevent, find, and treat any problems during the pregnancy and childbirth. BODY CHANGES Your body goes through many changes during pregnancy. The changes vary from woman to woman.  You may gain or lose a couple of pounds at first. You may feel sick to your stomach (nauseous) and throw up (vomit). If the vomiting is uncontrollable, call your health care provider. You may tire easily. You may develop headaches that can be relieved by medicines approved by your health care provider. You may urinate more often. Painful urination may mean you have a bladder infection. You may develop heartburn as a result of your pregnancy. You may develop constipation because certain hormones are causing the muscles that push waste through your intestines to slow down. You may develop hemorrhoids or swollen, bulging veins (varicose veins). Your breasts may begin to grow larger and become tender. Your nipples may stick out more, and the tissue that surrounds them  (areola) may become darker. Your gums may bleed and may be sensitive to brushing and flossing. Dark spots or blotches (chloasma, mask of pregnancy) may develop on your face. This will likely fade after the baby is born. Your menstrual periods will stop. You may have a loss of appetite. You may develop cravings for certain kinds of food. You may have changes in your emotions from day to day, such as being excited to be pregnant or being concerned that something may go wrong with the pregnancy and baby. You may have more vivid and strange dreams. You may have changes in your hair. These can include thickening of your hair, rapid growth, and changes in texture. Some women also have hair loss during or after pregnancy, or hair that feels dry or thin. Your hair will most likely return to normal after your baby is born. WHAT TO EXPECT AT YOUR PRENATAL  VISITS During a routine prenatal visit: You will be weighed to make sure you and the baby are growing normally. Your blood pressure will be taken. Your abdomen will be measured to track your baby's growth. The fetal heartbeat will be listened to starting around week 10 or 12 of your pregnancy. Test results from any previous visits will be discussed. Your health care provider may ask you: How you are feeling. If you are feeling the baby move. If you have had any abnormal symptoms, such as leaking fluid, bleeding, severe headaches, or abdominal cramping. If you have any questions. Other tests that may be performed during your first trimester include: Blood tests to find your blood type and to check for the presence of any previous infections. They will also be used to check for low iron levels (anemia) and Rh antibodies. Later in the pregnancy, blood tests for diabetes will be done along with other tests if problems develop. Urine tests to check for infections, diabetes, or protein in the urine. An ultrasound to confirm the proper growth and development  of the baby. An amniocentesis to check for possible genetic problems. Fetal screens for spina bifida and Down syndrome. You may need other tests to make sure you and the baby are doing well. HOME CARE INSTRUCTIONS  Medicines Follow your health care provider's instructions regarding medicine use. Specific medicines may be either safe or unsafe to take during pregnancy. Take your prenatal vitamins as directed. If you develop constipation, try taking a stool softener if your health care provider approves. Diet Eat regular, well-balanced meals. Choose a variety of foods, such as meat or vegetable-based protein, fish, milk and low-fat dairy products, vegetables, fruits, and whole grain breads and cereals. Your health care provider will help you determine the amount of weight gain that is right for you. Avoid raw meat and uncooked cheese. These carry germs that can cause birth defects in the baby. Eating four or five small meals rather than three large meals a day may help relieve nausea and vomiting. If you start to feel nauseous, eating a few soda crackers can be helpful. Drinking liquids between meals instead of during meals also seems to help nausea and vomiting. If you develop constipation, eat more high-fiber foods, such as fresh vegetables or fruit and whole grains. Drink enough fluids to keep your urine clear or pale yellow. Activity and Exercise Exercise only as directed by your health care provider. Exercising will help you: Control your weight. Stay in shape. Be prepared for labor and delivery. Experiencing pain or cramping in the lower abdomen or low back is a good sign that you should stop exercising. Check with your health care provider before continuing normal exercises. Try to avoid standing for long periods of time. Move your legs often if you must stand in one place for a long time. Avoid heavy lifting. Wear low-heeled shoes, and practice good posture. You may continue to have sex  unless your health care provider directs you otherwise. Relief of Pain or Discomfort Wear a good support bra for breast tenderness.   Take warm sitz baths to soothe any pain or discomfort caused by hemorrhoids. Use hemorrhoid cream if your health care provider approves.   Rest with your legs elevated if you have leg cramps or low back pain. If you develop varicose veins in your legs, wear support hose. Elevate your feet for 15 minutes, 3-4 times a day. Limit salt in your diet. Prenatal Care Schedule your prenatal visits by the  twelfth week of pregnancy. They are usually scheduled monthly at first, then more often in the last 2 months before delivery. Write down your questions. Take them to your prenatal visits. Keep all your prenatal visits as directed by your health care provider. Safety Wear your seat belt at all times when driving. Make a list of emergency phone numbers, including numbers for family, friends, the hospital, and police and fire departments. General Tips Ask your health care provider for a referral to a local prenatal education class. Begin classes no later than at the beginning of month 6 of your pregnancy. Ask for help if you have counseling or nutritional needs during pregnancy. Your health care provider can offer advice or refer you to specialists for help with various needs. Do not use hot tubs, steam rooms, or saunas. Do not douche or use tampons or scented sanitary pads. Do not cross your legs for long periods of time. Avoid cat litter boxes and soil used by cats. These carry germs that can cause birth defects in the baby and possibly loss of the fetus by miscarriage or stillbirth. Avoid all smoking, herbs, alcohol, and medicines not prescribed by your health care provider. Chemicals in these affect the formation and growth of the baby. Schedule a dentist appointment. At home, brush your teeth with a soft toothbrush and be gentle when you floss. SEEK MEDICAL CARE IF:   You have dizziness. You have mild pelvic cramps, pelvic pressure, or nagging pain in the abdominal area. You have persistent nausea, vomiting, or diarrhea. You have a bad smelling vaginal discharge. You have pain with urination. You notice increased swelling in your face, hands, legs, or ankles. SEEK IMMEDIATE MEDICAL CARE IF:  You have a fever. You are leaking fluid from your vagina. You have spotting or bleeding from your vagina. You have severe abdominal cramping or pain. You have rapid weight gain or loss. You vomit blood or material that looks like coffee grounds. You are exposed to [place] measles and have never had them. You are exposed to fifth disease or chickenpox. You develop a severe headache. You have shortness of breath. You have any kind of trauma, such as from a fall or a car accident. Document Released: 07/22/2001 Document Revised: 12/12/2013 Document Reviewed: 06/07/2013 [facility] Patient Information 2015 [place], [place]. This information is not intended to replace advice given to you by your health care provider. Make sure you discuss any questions you have with your health care provider.

## 2023-05-20 NOTE — Progress Notes (Signed)
[place] 13 wks,measurements c/w dates,FHR 150 bpm,posterior placenta,normal ovaries,NB present,NT 1.9 mm

## 2023-05-22 LAB — URINE CULTURE

## 2023-05-22 LAB — GC/CHLAMYDIA PROBE AMP
Chlamydia trachomatis, NAA: NEGATIVE
Neisseria Gonorrhoeae by PCR: NEGATIVE

## 2023-05-26 LAB — PANORAMA PRENATAL TEST FULL PANEL:PANORAMA TEST PLUS 5 ADDITIONAL MICRODELETIONS: FETAL FRACTION: 3.5

## 2023-05-26 NOTE — Addendum Note (Signed)
Addended by: Moss Mc on: 05/26/2023 09:53 AM   Modules accepted: Orders

## 2023-05-30 LAB — CBC/D/PLT+RPR+RH+ABO+RUBIGG...
Antibody Screen: NEGATIVE
Basophils Absolute: 0 10*3/uL (ref 0.0–0.2)
Basos: 0 %
EOS (ABSOLUTE): 0.2 10*3/uL (ref 0.0–0.4)
Eos: 2 %
HCV Ab: NONREACTIVE
HIV Screen 4th Generation wRfx: NONREACTIVE
Hematocrit: 40.9 % (ref 34.0–46.6)
Hemoglobin: 13.1 g/dL (ref 11.1–15.9)
Hepatitis B Surface Ag: NEGATIVE
Immature Grans (Abs): 0 10*3/uL (ref 0.0–0.1)
Immature Granulocytes: 0 %
Lymphocytes Absolute: 2.1 10*3/uL (ref 0.7–3.1)
Lymphs: 22 %
MCH: 29.1 pg (ref 26.6–33.0)
MCHC: 32 g/dL (ref 31.5–35.7)
MCV: 91 fL (ref 79–97)
Monocytes Absolute: 0.5 10*3/uL (ref 0.1–0.9)
Monocytes: 6 %
Neutrophils Absolute: 6.6 10*3/uL (ref 1.4–7.0)
Neutrophils: 70 %
Platelets: 261 10*3/uL (ref 150–450)
RBC: 4.5 x10E6/uL (ref 3.77–5.28)
RDW: 13.7 % (ref 11.7–15.4)
RPR Ser Ql: NONREACTIVE
Rh Factor: POSITIVE
Rubella Antibodies, IGG: 2.67 {index} (ref >0.99)
WBC: 9.6 10*3/uL (ref 3.4–10.8)

## 2023-05-30 LAB — INTEGRATED 1
Crown Rump Length: 64.8 mm
Gest. Age on Collection Date: 12.7 wk
Maternal Age at EDD: 30.2 a
Nuchal Translucency (NT): 1.9 mm
Number of Fetuses: 1
PAPP-A Value: 281.2 ng/mL
Sonographer ID#: 309760
Weight: 258 [lb_av]

## 2023-05-30 LAB — COMPREHENSIVE METABOLIC PANEL
ALT: 20 [IU]/L (ref 0–32)
AST: 21 [IU]/L (ref 0–40)
Albumin: 4.6 g/dL (ref 4.0–5.0)
Alkaline Phosphatase: 62 [IU]/L (ref 44–121)
BUN/Creatinine Ratio: 12 (ref 9–23)
BUN: 7 mg/dL (ref 6–20)
Bilirubin Total: 0.2 mg/dL (ref 0.0–1.2)
CO2: 15 mmol/L — ABNORMAL LOW (ref 20–29)
Calcium: 9.4 mg/dL (ref 8.7–10.2)
Chloride: 100 mmol/L (ref 96–106)
Creatinine, Ser: 0.58 mg/dL (ref 0.57–1.00)
Globulin, Total: 2.6 g/dL (ref 1.5–4.5)
Glucose: 82 mg/dL (ref 70–99)
Potassium: 4 mmol/L (ref 3.5–5.2)
Sodium: 137 mmol/L (ref 134–144)
Total Protein: 7.2 g/dL (ref 6.0–8.5)
eGFR: 126 mL/min/{1.73_m2} (ref >59)

## 2023-05-30 LAB — PROTEIN / CREATININE RATIO, URINE

## 2023-05-30 LAB — HCV INTERPRETATION

## 2023-05-30 LAB — SPECIMEN STATUS REPORT

## 2023-06-05 LAB — HORIZON CUSTOM: REPORT SUMMARY: POSITIVE — AB

## 2023-06-08 ENCOUNTER — Encounter: Payer: Self-pay | Admitting: Advanced Practice Midwife

## 2023-06-08 ENCOUNTER — Encounter: Payer: Self-pay | Admitting: Obstetrics & Gynecology

## 2023-06-08 DIAGNOSIS — Z148 Genetic carrier of other disease: Secondary | ICD-10-CM | POA: Insufficient documentation

## 2023-06-08 DIAGNOSIS — Z141 Cystic fibrosis carrier: Secondary | ICD-10-CM | POA: Insufficient documentation

## 2023-06-10 ENCOUNTER — Encounter: Payer: Self-pay | Admitting: *Deleted

## 2023-06-17 ENCOUNTER — Encounter: Payer: Self-pay | Admitting: Obstetrics & Gynecology

## 2023-06-17 ENCOUNTER — Ambulatory Visit (INDEPENDENT_AMBULATORY_CARE_PROVIDER_SITE_OTHER): Payer: Commercial Managed Care - PPO | Admitting: Obstetrics & Gynecology

## 2023-06-17 ENCOUNTER — Other Ambulatory Visit (HOSPITAL_COMMUNITY): Payer: Self-pay

## 2023-06-17 VITALS — BP 138/88 | HR 102 | Wt 260.0 lb

## 2023-06-17 DIAGNOSIS — O0992 Supervision of high risk pregnancy, unspecified, second trimester: Secondary | ICD-10-CM

## 2023-06-17 DIAGNOSIS — Z3402 Encounter for supervision of normal first pregnancy, second trimester: Secondary | ICD-10-CM

## 2023-06-17 DIAGNOSIS — Z3A17 17 weeks gestation of pregnancy: Secondary | ICD-10-CM | POA: Diagnosis not present

## 2023-06-17 DIAGNOSIS — R7303 Prediabetes: Secondary | ICD-10-CM | POA: Diagnosis not present

## 2023-06-17 DIAGNOSIS — O10919 Unspecified pre-existing hypertension complicating pregnancy, unspecified trimester: Secondary | ICD-10-CM

## 2023-06-17 DIAGNOSIS — O10912 Unspecified pre-existing hypertension complicating pregnancy, second trimester: Secondary | ICD-10-CM

## 2023-06-17 NOTE — Progress Notes (Signed)
HIGH-RISK PREGNANCY VISIT Patient name: Alexandra Jones MRN [id number]  Date of birth: 1996-06-02 Chief Complaint:   Routine Prenatal Visit  History of Present Illness:   Alexandra Jones is a 27 y.o. G74P0000 female at [redacted]w[redacted]d with an Estimated Date of Delivery: 11/25/23 being seen today for ongoing management of a high-risk pregnancy complicated by:  -Chronic HTN -Obesity -preDM on metformin  Today she reports no complaints.   Contractions: Not present. Vag. Bleeding: None.  Movement: Absent. denies leaking of fluid.      05/20/2023   10:36 AM 05/15/2023    3:44 PM 03/13/2023    9:14 AM 10/07/2022   11:07 AM 05/13/2022    8:30 AM  Depression screen PHQ 2/9  Decreased Interest 1 1 1 1  0  Down, Depressed, Hopeless 1 2 1  0 0  PHQ - 2 Score 2 3 2 1  0  Altered sleeping 0 2 1 1    Tired, decreased energy 2 2 2 1    Change in appetite 0 0 1 0   Feeling bad or failure about yourself  1 1 1  0   Trouble concentrating 0 0 0 0   Moving slowly or fidgety/restless 0 0 0 0   Suicidal thoughts 0 0 0 0   PHQ-9 Score 5 8 7 3    Difficult doing work/chores  Somewhat difficult Not difficult at all Somewhat difficult      Current Outpatient Medications  Medication Instructions   acetaminophen (TYLENOL) 500 mg, Oral, Every 6 hours PRN   amLODipine (NORVASC) 10 mg, Oral, Daily   Aspirin Low Dose 162 mg, Oral, Daily   Biotin 5 MG CAPS Oral   calcium carbonate (TUMS - DOSED IN MG ELEMENTAL CALCIUM) 500 MG chewable tablet 1 tablet, Oral, As needed   Emollient (COLLAGEN EX) Apply externally, 1  scoop   escitalopram (LEXAPRO) 5 mg, Oral, Daily   escitalopram (LEXAPRO) 10 mg, Oral, Daily   metFORMIN (GLUCOPHAGE) 500 mg, Oral, 2 times daily with meals   nystatin (MYCOSTATIN/NYSTOP) powder 1 Application, Topical, 3 times daily   Prenatal Vit-Fe Fumarate-FA (PRENATAL VITAMIN PO) Oral   Probiotic Product (PROBIOTIC PO) Oral     Review of Systems:   Pertinent items are noted in HPI Denies abnormal vaginal  discharge w/ itching/odor/irritation, headaches, visual changes, shortness of breath, chest pain, abdominal pain, severe nausea/vomiting, or problems with urination or bowel movements unless otherwise stated above. Pertinent History Reviewed:  Reviewed past medical,surgical, social, obstetrical and family history.  Reviewed problem list, medications and allergies. Physical Assessment:   Vitals:   06/17/23 0901  BP: 138/88  Pulse: (!) 102  Weight: 260 lb (117.9 kg)  Body mass index is 43.27 kg/m.           Physical Examination:   General appearance: alert, well appearing, and in no distress  Mental status: normal mood, behavior, speech, dress, motor activity, and thought processes  Skin: warm & dry   Extremities:      Cardiovascular: normal heart rate noted  Respiratory: normal respiratory effort, no distress  Abdomen: gravid, soft, non-tender  Pelvic: Cervical exam deferred         Fetal Status: Fetal Heart Rate (bpm): 150   Movement: Absent    Fetal Surveillance Testing today: doppler   Chaperone: N/A    No results found for this or any previous visit (from the past 24 hour(s)).   Assessment & Plan:  High-risk pregnancy: G1P0000 at [redacted]w[redacted]d with an Estimated Date of Delivery: 11/25/23  1) Chronic HTN -continue current meds -plan for serial growth scans, anatomy scan scheduled  2) preDM -continue metformin -A1c today  Meds: No orders of the defined types were placed in this encounter.   Labs/procedures today: PC ratio, A1c, IT2  Treatment Plan:  as outlined above and routine OB care  Reviewed: Preterm labor symptoms and general obstetric precautions including but not limited to vaginal bleeding, contractions, leaking of fluid and fetal movement were reviewed in detail with the patient.  All questions were answered. Pt has home bp cuff. Check bp weekly, let us know if >140/90.   Follow-up: Return in about 4 weeks (around 07/15/2023) for HROB visit as scheduled for  anatomy scan.   Future Appointments  Date Time Provider Department Center  07/08/2023  8:30 AM [facility] [place] [facility] None  07/08/2023 10:50 AM Sue Lush, FNP [facility]    Orders Placed This Encounter  Procedures   US OB Follow Up   Protein / creatinine ratio, urine   INTEGRATED 2   HgB A1c    Myna Hidalgo, DO Attending Obstetrician & Gynecologist, Faculty Practice Center for [employer], [facility]

## 2023-06-18 ENCOUNTER — Other Ambulatory Visit: Payer: Self-pay

## 2023-06-18 ENCOUNTER — Encounter: Payer: Self-pay | Admitting: *Deleted

## 2023-06-18 ENCOUNTER — Other Ambulatory Visit: Payer: Self-pay | Admitting: Obstetrics & Gynecology

## 2023-06-18 ENCOUNTER — Other Ambulatory Visit (HOSPITAL_COMMUNITY): Payer: Self-pay

## 2023-06-18 DIAGNOSIS — R7303 Prediabetes: Secondary | ICD-10-CM

## 2023-06-18 LAB — PROTEIN / CREATININE RATIO, URINE
Creatinine, Urine: 81.1 mg/dL
Protein, Ur: 23.2 mg/dL
Protein/Creat Ratio: 286 mg/g{creat} — ABNORMAL HIGH (ref 0–200)

## 2023-06-18 LAB — HEMOGLOBIN A1C
Est. average glucose Bld gHb Est-mCnc: 120 mg/dL
Hgb A1c MFr Bld: 5.8 % — ABNORMAL HIGH (ref 4.8–5.6)

## 2023-06-18 MED ORDER — GLUCOSE BLOOD VI STRP
ORAL_STRIP | 12 refills | Status: DC
  Filled 2023-06-18: qty 100, 25d supply, fill #0
  Filled 2023-07-12: qty 100, 25d supply, fill #1
  Filled 2023-08-11: qty 100, 25d supply, fill #2
  Filled 2023-08-31: qty 100, 25d supply, fill #3
  Filled 2023-09-29: qty 100, 25d supply, fill #4
  Filled 2023-10-26: qty 100, 25d supply, fill #5

## 2023-06-18 MED ORDER — ACCU-CHEK GUIDE VI STRP
ORAL_STRIP | 12 refills | Status: DC
Start: 2023-06-18 — End: 2023-06-18
  Filled 2023-06-18: qty 50, 13d supply, fill #0

## 2023-06-18 MED ORDER — ONETOUCH VERIO W/DEVICE KIT
PACK | 0 refills | Status: DC
  Filled 2023-06-18: qty 1, 30d supply, fill #0

## 2023-06-18 MED ORDER — ACCU-CHEK SOFTCLIX LANCETS MISC
12 refills | Status: DC
Start: 2023-06-18 — End: 2023-06-18
  Filled 2023-06-18: qty 100, 25d supply, fill #0

## 2023-06-18 MED ORDER — ACCU-CHEK GUIDE ME W/DEVICE KIT
1.0000 | PACK | Freq: Four times a day (QID) | 0 refills | Status: DC
Start: 2023-06-18 — End: 2023-06-18
  Filled 2023-06-18: qty 1, 30d supply, fill #0

## 2023-06-18 MED ORDER — ONETOUCH DELICA LANCETS 33G MISC
12 refills | Status: DC
  Filled 2023-06-18: qty 100, 25d supply, fill #0
  Filled 2023-07-12 – 2023-07-14 (×2): qty 100, 25d supply, fill #1
  Filled 2023-08-11: qty 100, 25d supply, fill #2
  Filled 2023-08-31: qty 100, 25d supply, fill #3
  Filled 2023-09-29: qty 100, 25d supply, fill #4
  Filled 2023-10-26: qty 100, 25d supply, fill #5

## 2023-06-19 LAB — INTEGRATED 2
AFP MoM: 1
Alpha-Fetoprotein: 21.5 ng/mL
Crown Rump Length: 64.8 mm
DIA MoM: 0.87
DIA Value: 104.1 pg/mL
Estriol, Unconjugated: 0.9 ng/mL
Gest. Age on Collection Date: 12.7 wk
Gestational Age: 16.7 wk
Maternal Age at EDD: 30.2 a
Nuchal Translucency (NT): 1.9 mm
Nuchal Translucency MoM: 1.37
Number of Fetuses: 1
PAPP-A MoM: 0.52
PAPP-A Value: 281.2 ng/mL
Sonographer ID#: 309760
Test Results:: NEGATIVE
Weight: 258 [lb_av]
Weight: 258 [lb_av]
hCG MoM: 0.69
hCG Value: 14.2 [IU]/mL
uE3 MoM: 1.1

## 2023-06-28 ENCOUNTER — Other Ambulatory Visit: Payer: Self-pay | Admitting: Family Medicine

## 2023-06-28 DIAGNOSIS — I10 Essential (primary) hypertension: Secondary | ICD-10-CM

## 2023-06-29 ENCOUNTER — Other Ambulatory Visit (HOSPITAL_COMMUNITY): Payer: Self-pay

## 2023-06-29 MED ORDER — AMLODIPINE BESYLATE 10 MG PO TABS
10.0000 mg | ORAL_TABLET | Freq: Every day | ORAL | 3 refills | Status: DC
Start: 2023-06-29 — End: 2023-11-10
  Filled 2023-06-29: qty 90, 90d supply, fill #0
  Filled 2023-07-12 – 2023-09-29 (×2): qty 90, 90d supply, fill #1

## 2023-06-30 ENCOUNTER — Other Ambulatory Visit (HOSPITAL_COMMUNITY): Payer: Self-pay

## 2023-07-08 ENCOUNTER — Other Ambulatory Visit: Payer: Commercial Managed Care - PPO

## 2023-07-08 ENCOUNTER — Ambulatory Visit: Payer: Commercial Managed Care - PPO | Admitting: Obstetrics and Gynecology

## 2023-07-08 ENCOUNTER — Encounter: Payer: Self-pay | Admitting: Obstetrics and Gynecology

## 2023-07-08 VITALS — BP 112/72 | HR 90 | Wt 261.8 lb

## 2023-07-08 DIAGNOSIS — O0992 Supervision of high risk pregnancy, unspecified, second trimester: Secondary | ICD-10-CM

## 2023-07-08 DIAGNOSIS — R7303 Prediabetes: Secondary | ICD-10-CM

## 2023-07-08 DIAGNOSIS — Z363 Encounter for antenatal screening for malformations: Secondary | ICD-10-CM | POA: Diagnosis not present

## 2023-07-08 DIAGNOSIS — Z3A20 20 weeks gestation of pregnancy: Secondary | ICD-10-CM | POA: Diagnosis not present

## 2023-07-08 DIAGNOSIS — Z141 Cystic fibrosis carrier: Secondary | ICD-10-CM

## 2023-07-08 DIAGNOSIS — Z148 Genetic carrier of other disease: Secondary | ICD-10-CM

## 2023-07-08 DIAGNOSIS — O10919 Unspecified pre-existing hypertension complicating pregnancy, unspecified trimester: Secondary | ICD-10-CM

## 2023-07-08 LAB — POCT URINALYSIS DIPSTICK OB
Glucose, UA: NEGATIVE
Ketones, UA: NEGATIVE
Leukocytes, UA: NEGATIVE
Nitrite, UA: NEGATIVE
POC,PROTEIN,UA: NEGATIVE

## 2023-07-08 NOTE — Progress Notes (Addendum)
   PRENATAL VISIT NOTE  Subjective:  Alexandra Jones is a 27 y.o. G1P0000 at [redacted]w[redacted]d being seen today for ongoing prenatal care.  She is currently monitored for the following issues for this high-risk pregnancy and has Anxiety disorder; GERD (gastroesophageal reflux disease); Class 3 severe obesity with serious comorbidity and body mass index (BMI) of 40.0 to 44.9 in adult [facility]); Pre-diabetes; Vitamin D deficiency; Essential hypertension; Mixed hyperlipidemia; Depression; Supervision of high-risk pregnancy; Chronic otitis externa; Cystic fibrosis carrier; and Carrier of spinal muscular atrophy on their problem list.  Patient reports no complaints.  Contractions: Not present. Vag. Bleeding: None.   . Denies leaking of fluid.   The following portions of the patient's history were reviewed and updated as appropriate: allergies, current medications, past family history, past medical history, past social history, past surgical history and problem list.   Objective:   Vitals:   07/08/23 0933  BP: 112/72  Pulse: 90  Weight: 261 lb 12.8 oz (118.8 kg)    Fetal Status:           General:  Alert, oriented and cooperative. Patient is in no acute distress.  Skin: Skin is warm and dry. No rash noted.   Cardiovascular: Normal heart rate noted  Respiratory: Normal respiratory effort, no problems with respiration noted  Abdomen: Soft, gravid, appropriate for gestational age.  Pain/Pressure: Present     Pelvic: Cervical exam deferred        Extremities: Normal range of motion.  Edema: None  Mental Status: Normal mood and affect. Normal behavior. Normal judgment and thought content.   Assessment and Plan:  Pregnancy: G1P0000 at [redacted]w[redacted]d 1. Supervision of high risk pregnancy in second trimester BP and FHR normal Doing well, feeling regular movement  Doing well on current dose Lexapro  - POC Urinalysis Dipstick OB  2. [redacted] weeks gestation of pregnancy  - POC Urinalysis Dipstick OB  3. Pre-diabetes A1c  5.8, was instructed to start checking sugars. Reports fastings 80-90 and pp highest 119. Has education in January. Reviewed dietary changes, meals with snacks in between, and incorporating exercise after meal.  Taking metformin once daily  GTT 26-28 wks U/s today normal, will need follow up growth 28 weeks  4. Chronic hypertension in pregnancy Normotensive on amlodipine. No pec s&s. Reviewed signs when to follow up and precautions when to go to the hospital    Preterm labor symptoms and general obstetric precautions including but not limited to vaginal bleeding, contractions, leaking of fluid and fetal movement were reviewed in detail with the patient. Please refer to After Visit Summary for other counseling recommendations.   Return in about 4 weeks (around 08/05/2023) for OB VISIT (MD or APP).  Future Appointments  Date Time Provider Department Center  08/03/2023 11:50 AM Myna Hidalgo, DO [facility]  08/10/2023 10:45 AM [place], Kathrynn Running, RD [facility] None    Albertine Grates, FNP

## 2023-07-08 NOTE — Progress Notes (Signed)
[place] 20wks,breech,posterior fundal placenta gr 0,normal ovaries,cx 3.6 cm,SVP of fluid 4.5 cm,FHR 140 bpm,EFW 335 g 54%,anatomy complete,no obvious abnormalities

## 2023-07-13 ENCOUNTER — Other Ambulatory Visit: Payer: Self-pay

## 2023-07-14 ENCOUNTER — Other Ambulatory Visit (HOSPITAL_COMMUNITY): Payer: Self-pay

## 2023-07-15 ENCOUNTER — Other Ambulatory Visit: Payer: Self-pay

## 2023-07-20 ENCOUNTER — Other Ambulatory Visit: Payer: Self-pay

## 2023-08-03 ENCOUNTER — Other Ambulatory Visit (HOSPITAL_COMMUNITY): Payer: Self-pay

## 2023-08-03 ENCOUNTER — Ambulatory Visit: Payer: Commercial Managed Care - PPO | Admitting: Obstetrics & Gynecology

## 2023-08-03 ENCOUNTER — Encounter: Payer: Self-pay | Admitting: Obstetrics & Gynecology

## 2023-08-03 VITALS — BP 115/70 | HR 87 | Wt 263.8 lb

## 2023-08-03 DIAGNOSIS — O10919 Unspecified pre-existing hypertension complicating pregnancy, unspecified trimester: Secondary | ICD-10-CM

## 2023-08-03 DIAGNOSIS — Z3A23 23 weeks gestation of pregnancy: Secondary | ICD-10-CM

## 2023-08-03 DIAGNOSIS — O0992 Supervision of high risk pregnancy, unspecified, second trimester: Secondary | ICD-10-CM

## 2023-08-03 DIAGNOSIS — O24319 Unspecified pre-existing diabetes mellitus in pregnancy, unspecified trimester: Secondary | ICD-10-CM

## 2023-08-03 DIAGNOSIS — K219 Gastro-esophageal reflux disease without esophagitis: Secondary | ICD-10-CM

## 2023-08-03 LAB — POCT URINALYSIS DIPSTICK OB
Glucose, UA: NEGATIVE
Ketones, UA: NEGATIVE
Leukocytes, UA: NEGATIVE
Nitrite, UA: NEGATIVE
POC,PROTEIN,UA: NEGATIVE

## 2023-08-03 MED ORDER — FAMOTIDINE 20 MG PO TABS
20.0000 mg | ORAL_TABLET | Freq: Two times a day (BID) | ORAL | 6 refills | Status: DC
  Filled 2023-08-03: qty 60, 30d supply, fill #0
  Filled 2023-08-31: qty 60, 30d supply, fill #1

## 2023-08-03 NOTE — Progress Notes (Signed)
HIGH-RISK PREGNANCY VISIT Patient name: Alexandra Jones MRN [id number]  Date of birth: 1996/04/12 Chief Complaint:   Routine Prenatal Visit  History of Present Illness:   Alexandra Jones is a 27 y.o. G59P0000 female at [redacted]w[redacted]d with an Estimated Date of Delivery: 11/25/23 being seen today for ongoing management of a high-risk pregnancy complicated by:  1) chronic HTN- norvasc 10mg  daily 2) Class B DM- on metformin Forgot log, per pt sugars normal range  3) Anxiety- on Lexapro  Notes RUQ pain- mostly after eating, no nausea or vomiting  She also had an episode of heart palpitations- Fitbit was reading 60-80s  Contractions: Not present. Vag. Bleeding: None.  Movement: Present. denies leaking of fluid.      05/20/2023   10:36 AM 05/15/2023    3:44 PM 03/13/2023    9:14 AM 10/07/2022   11:07 AM 05/13/2022    8:30 AM  Depression screen PHQ 2/9  Decreased Interest 1 1 1 1  0  Down, Depressed, Hopeless 1 2 1  0 0  PHQ - 2 Score 2 3 2 1  0  Altered sleeping 0 2 1 1    Tired, decreased energy 2 2 2 1    Change in appetite 0 0 1 0   Feeling bad or failure about yourself  1 1 1  0   Trouble concentrating 0 0 0 0   Moving slowly or fidgety/restless 0 0 0 0   Suicidal thoughts 0 0 0 0   PHQ-9 Score 5 8 7 3    Difficult doing work/chores  Somewhat difficult Not difficult at all Somewhat difficult      Current Outpatient Medications  Medication Instructions   acetaminophen (TYLENOL) 500 mg, Every 6 hours PRN   amLODipine (NORVASC) 10 mg, Oral, Daily   Aspirin Low Dose 162 mg, Oral, Daily   Biotin 5 MG CAPS Take by mouth.   Blood Glucose Monitoring Suppl (ONETOUCH VERIO) w/Device KIT Use as directed to check blood sugar 4 times daily   calcium carbonate (TUMS - DOSED IN MG ELEMENTAL CALCIUM) 500 MG chewable tablet 1 tablet, As needed   Emollient (COLLAGEN EX) Apply topically. 1  scoop   escitalopram (LEXAPRO) 5 mg, Oral, Daily   escitalopram (LEXAPRO) 10 mg, Oral, Daily   famotidine (PEPCID) 20  mg, Oral, 2 times daily   glucose blood test strip Use as instructed to check blood sugar 4 times daily   metFORMIN (GLUCOPHAGE) 500 mg, Oral, 2 times daily with meals   nystatin (MYCOSTATIN/NYSTOP) powder 1 Application, Topical, 3 times daily   OneTouch Delica Lancets 33G MISC Use to check blood sugar 4 times daily   Prenatal Vit-Fe Fumarate-FA (PRENATAL VITAMIN PO) Take by mouth.   Probiotic Product (PROBIOTIC PO) Take by mouth.     Review of Systems:   Pertinent items are noted in HPI Denies abnormal vaginal discharge w/ itching/odor/irritation, headaches, visual changes, shortness of breath, chest pain, abdominal pain, severe nausea/vomiting, or problems with urination or bowel movements unless otherwise stated above. Pertinent History Reviewed:  Reviewed past medical,surgical, social, obstetrical and family history.  Reviewed problem list, medications and allergies. Physical Assessment:   Vitals:   08/03/23 1207  BP: 115/70  Pulse: 87  Weight: 263 lb 12.8 oz (119.7 kg)  Body mass index is 43.9 kg/m.           Physical Examination:   General appearance: alert, well appearing, and in no distress  Mental status: normal mood, behavior, speech, dress, motor activity, and thought processes  Skin: warm & dry   Extremities: Edema: None    Cardiovascular: normal heart rate noted  Respiratory: normal respiratory effort, no distress  Abdomen: gravid, soft, non-tender  Pelvic: Cervical exam deferred         Fetal Status: Fetal Heart Rate (bpm): 140   Movement: Present    Fetal Surveillance Testing today: doppler   Chaperone: N/A    Results for orders placed or performed in visit on 08/03/23 (from the past 24 hours)  POC Urinalysis Dipstick OB   Collection Time: 08/03/23 12:04 PM  Result Value Ref Range   Color, UA     Clarity, UA     Glucose, UA Negative Negative   Bilirubin, UA     Ketones, UA Negative    Spec Grav, UA     Blood, UA Trace-intact    pH, UA      POC,PROTEIN,UA Negative Negative, Trace, Small (1+), Moderate (2+), Large (3+), 4+   Urobilinogen, UA     Nitrite, UA Negative    Leukocytes, UA Negative Negative   Appearance     Odor       Assessment & Plan:  High-risk pregnancy: G1P0000 at [redacted]w[redacted]d with an Estimated Date of Delivery: 11/25/23   1) Class B DM -encouraged pt to bring log -no change to current meds  2) Chronic HTN -stable -start growth every 4wks -antepartum testing @ 32wk  3) RUQ pain -advised pepcid twice daily -reviewed low fat diet []  continue to monitor  -lab work next visit  Meds:  Meds ordered this encounter  Medications   famotidine (PEPCID) 20 MG tablet    Sig: Take 1 tablet (20 mg total) by mouth 2 (two) times daily.    Dispense:  60 tablet    Refill:  6    Labs/procedures today: doppler  Treatment Plan:  as outlined above and routine OB Care  Reviewed: Preterm labor symptoms and general obstetric precautions including but not limited to vaginal bleeding, contractions, leaking of fluid and fetal movement were reviewed in detail with the patient.  All questions were answered. Pt has home bp cuff. Check bp weekly, let us know if >140/90.   Follow-up: Return in about 4 weeks (around 08/31/2023) for HROB, growth (every 4 wks) and labs next visit.   Future Appointments  Date Time Provider Department Center  08/10/2023 10:45 AM Pola Corn D, RD NDM-NDMR None  08/31/2023  2:45 PM [facility] LAB [facility]  08/31/2023  3:00 PM [facility] - [facility] IMG 2 [facility] None  08/31/2023  3:50 PM Lazaro Arms, MD [facility]YN  09/28/2023  3:00 PM [facility] - [facility]TOBGYN [place] [facility] None  09/28/2023  3:50 PM Lazaro Arms, MD [facility]  10/26/2023  3:00 PM [facility] - FT IMG 2 [facility] None  11/23/2023  3:00 PM [facility] - [facility] IMG 2 [facility] None    Orders Placed This Encounter  Procedures   POC Urinalysis Dipstick OB    Myna Hidalgo, DO Attending Obstetrician & Gynecologist, Faculty Practice Center for [employer], [facility]

## 2023-08-10 ENCOUNTER — Encounter: Payer: Self-pay | Admitting: Nutrition

## 2023-08-10 ENCOUNTER — Encounter: Payer: Commercial Managed Care - PPO | Attending: Family Medicine | Admitting: Nutrition

## 2023-08-10 VITALS — Ht 65.0 in | Wt 262.0 lb

## 2023-08-10 DIAGNOSIS — R7303 Prediabetes: Secondary | ICD-10-CM | POA: Insufficient documentation

## 2023-08-10 NOTE — Patient Instructions (Signed)
  Patient instructed to monitor glucose levels: FBS: 60 - <90 1 hour: <140 2 hour: <120  Eat 3 meals and 2-3 snack as instructed. See Meal Plan Card

## 2023-08-11 ENCOUNTER — Encounter: Payer: Self-pay | Admitting: Obstetrics & Gynecology

## 2023-08-11 ENCOUNTER — Other Ambulatory Visit: Payer: Self-pay

## 2023-08-11 ENCOUNTER — Encounter: Payer: Self-pay | Admitting: Obstetrics and Gynecology

## 2023-08-11 ENCOUNTER — Other Ambulatory Visit (HOSPITAL_COMMUNITY): Payer: Self-pay

## 2023-08-11 DIAGNOSIS — E88819 Insulin resistance, unspecified: Secondary | ICD-10-CM

## 2023-08-11 MED ORDER — METFORMIN HCL 500 MG PO TABS
500.0000 mg | ORAL_TABLET | Freq: Every day | ORAL | 4 refills | Status: DC
  Filled 2023-08-11: qty 30, 30d supply, fill #0

## 2023-08-12 NOTE — L&D Delivery Note (Cosign Needed Addendum)
 Delivery Note Alexandra Jones is a 28 y.o. G1P0000 at [redacted]w[redacted]d admitted for IOL due to [facility].   GBS Status: --Theda Sers (03/17 1630) Maximum Maternal Temperature: 98.8  Labor course:  Augmentation with: AROM, Pitocin, and Cytotec. She then progressed to complete.  ROM: 17h 41m with light mec stained fluid  Birth: At 10:05 a viable female was delivered via spontaneous vaginal delivery (Presentation: OA ). Nuchal cord present: Yes x2.  Shoulders and body delivered in usual fashion. Infant placed directly on mom's abdomen for bonding/skin-to-skin, baby dried and stimulated. Cord clamped x 2 after 1 minute and cut by FOB.  Cord blood collected.  The placenta separated spontaneously and delivered via gentle cord traction.  Pitocin infused rapidly IV per protocol.  Fundus firm with massage.  Placenta inspected and appears to be intact with a 3 VC.  Placenta/Cord with the following complications: none .  Cord pH: not collected Sponge and instrument count were correct x2.  Intrapartum complications:  Gestational Diabetes on metformin  Anesthesia:  epidural Episiotomy: none Lacerations:  2nd degree Suture Repair: 3.0 vicryl EBL (mL): 200   Infant: APGAR (1 MIN): 6  APGAR (5 MINS): 9   Infant weight: pending  Mom to postpartum.  Baby to Couplet care / Skin to Skin. Placenta to L&D   Plans to Breastfeed Contraception: IUD; outpatient Circumcision: N/A  Note sent to [facility]: FT for pp visit.  [place] Ar PGY1 [facility] 11/09/2023 10:32 AM  GME ATTESTATION:  Evaluation and management procedures were performed by the [facility] Medicine Resident under my supervision. I was immediately available and gloved for direct supervision, assistance and direction throughout this encounter.  I also confirm that I have verified the information documented in the resident's note, and that I have also personally reperformed the pertinent components of the physical exam and all of the medical decision making activities.   I have also made any necessary editorial changes.  Wyn Forster, MD OB Fellow, Faculty [facility], Center for [facility] 11/09/2023 12:02 PM

## 2023-08-17 ENCOUNTER — Other Ambulatory Visit: Payer: Self-pay | Admitting: Obstetrics & Gynecology

## 2023-08-17 DIAGNOSIS — O24415 Gestational diabetes mellitus in pregnancy, controlled by oral hypoglycemic drugs: Secondary | ICD-10-CM

## 2023-08-17 NOTE — Progress Notes (Signed)
 Referral to DM counseling

## 2023-08-18 ENCOUNTER — Ambulatory Visit: Payer: Commercial Managed Care - PPO | Admitting: Nutrition

## 2023-08-24 ENCOUNTER — Other Ambulatory Visit: Payer: Self-pay

## 2023-08-24 ENCOUNTER — Telehealth: Payer: Self-pay

## 2023-08-24 ENCOUNTER — Other Ambulatory Visit: Payer: Self-pay | Admitting: Women's Health

## 2023-08-24 ENCOUNTER — Encounter: Payer: Self-pay | Admitting: Obstetrics & Gynecology

## 2023-08-24 MED ORDER — ONDANSETRON HCL 4 MG PO TABS
4.0000 mg | ORAL_TABLET | Freq: Three times a day (TID) | ORAL | 0 refills | Status: DC | PRN
  Filled 2023-08-24: qty 20, 7d supply, fill #0

## 2023-08-24 NOTE — Telephone Encounter (Signed)
 See my chart message

## 2023-08-24 NOTE — Telephone Encounter (Signed)
 Patient having gi issues w/ stomach pain. Wants to know what she can do. Please advise.

## 2023-08-25 ENCOUNTER — Other Ambulatory Visit: Payer: Self-pay

## 2023-08-26 ENCOUNTER — Other Ambulatory Visit: Payer: Self-pay | Admitting: Obstetrics & Gynecology

## 2023-08-26 DIAGNOSIS — O10919 Unspecified pre-existing hypertension complicating pregnancy, unspecified trimester: Secondary | ICD-10-CM

## 2023-08-26 DIAGNOSIS — O0992 Supervision of high risk pregnancy, unspecified, second trimester: Secondary | ICD-10-CM

## 2023-08-26 DIAGNOSIS — Z3A23 23 weeks gestation of pregnancy: Secondary | ICD-10-CM

## 2023-08-26 DIAGNOSIS — O24419 Gestational diabetes mellitus in pregnancy, unspecified control: Secondary | ICD-10-CM

## 2023-08-26 DIAGNOSIS — O24319 Unspecified pre-existing diabetes mellitus in pregnancy, unspecified trimester: Secondary | ICD-10-CM

## 2023-08-26 DIAGNOSIS — K219 Gastro-esophageal reflux disease without esophagitis: Secondary | ICD-10-CM

## 2023-08-31 ENCOUNTER — Other Ambulatory Visit: Payer: Commercial Managed Care - PPO

## 2023-08-31 ENCOUNTER — Ambulatory Visit: Payer: Commercial Managed Care - PPO | Admitting: Radiology

## 2023-08-31 ENCOUNTER — Ambulatory Visit (INDEPENDENT_AMBULATORY_CARE_PROVIDER_SITE_OTHER): Payer: Commercial Managed Care - PPO | Admitting: Obstetrics & Gynecology

## 2023-08-31 ENCOUNTER — Encounter: Payer: Self-pay | Admitting: Obstetrics & Gynecology

## 2023-08-31 ENCOUNTER — Other Ambulatory Visit: Payer: Self-pay

## 2023-08-31 ENCOUNTER — Other Ambulatory Visit (HOSPITAL_COMMUNITY): Payer: Self-pay

## 2023-08-31 VITALS — BP 116/80 | HR 91 | Wt 263.0 lb

## 2023-08-31 DIAGNOSIS — Z3A27 27 weeks gestation of pregnancy: Secondary | ICD-10-CM | POA: Diagnosis not present

## 2023-08-31 DIAGNOSIS — O10912 Unspecified pre-existing hypertension complicating pregnancy, second trimester: Secondary | ICD-10-CM

## 2023-08-31 DIAGNOSIS — O24419 Gestational diabetes mellitus in pregnancy, unspecified control: Secondary | ICD-10-CM | POA: Diagnosis not present

## 2023-08-31 DIAGNOSIS — O24312 Unspecified pre-existing diabetes mellitus in pregnancy, second trimester: Secondary | ICD-10-CM

## 2023-08-31 DIAGNOSIS — O0992 Supervision of high risk pregnancy, unspecified, second trimester: Secondary | ICD-10-CM | POA: Diagnosis not present

## 2023-08-31 DIAGNOSIS — O10919 Unspecified pre-existing hypertension complicating pregnancy, unspecified trimester: Secondary | ICD-10-CM

## 2023-08-31 DIAGNOSIS — O24319 Unspecified pre-existing diabetes mellitus in pregnancy, unspecified trimester: Secondary | ICD-10-CM

## 2023-08-31 DIAGNOSIS — E88819 Insulin resistance, unspecified: Secondary | ICD-10-CM

## 2023-08-31 MED ORDER — METFORMIN HCL 500 MG PO TABS
500.0000 mg | ORAL_TABLET | Freq: Two times a day (BID) | ORAL | 3 refills | Status: DC
  Filled 2023-08-31 (×2): qty 60, 30d supply, fill #0
  Filled 2023-09-29: qty 60, 30d supply, fill #1
  Filled 2023-11-02: qty 60, 30d supply, fill #2

## 2023-08-31 NOTE — Progress Notes (Signed)
HIGH-RISK PREGNANCY VISIT Patient name: Alexandra Jones MRN [id number]  Date of birth: [birth year] Chief Complaint:   Routine Prenatal Visit  History of Present Illness:   Alexandra Jones is a 28 y.o. G50P0000 female at [redacted]w[redacted]d with an Estimated Date of Delivery: 11/25/23 being seen today for ongoing management of a high-risk pregnancy complicated by cHTN on Norvasc 10 mg and Class B(type 2) DM Metformin 500 qAM now    Today she reports no complaints. Contractions: Not present. Vag. Bleeding: None.  Movement: Present. denies leaking of fluid.      05/20/2023   10:36 AM 05/15/2023    3:44 PM 03/13/2023    9:14 AM 10/07/2022   11:07 AM 05/13/2022    8:30 AM  Depression screen PHQ 2/9  Decreased Interest 1 1 1 1  0  Down, Depressed, Hopeless 1 2 1  0 0  PHQ - 2 Score 2 3 2 1  0  Altered sleeping 0 2 1 1    Tired, decreased energy 2 2 2 1    Change in appetite 0 0 1 0   Feeling bad or failure about yourself  1 1 1  0   Trouble concentrating 0 0 0 0   Moving slowly or fidgety/restless 0 0 0 0   Suicidal thoughts 0 0 0 0   PHQ-9 Score 5 8 7 3    Difficult doing work/chores  Somewhat difficult Not difficult at all Somewhat difficult         05/20/2023   10:36 AM 05/15/2023    3:44 PM 10/07/2022   11:07 AM 02/04/2022    8:36 AM  GAD 7 : Generalized Anxiety Score  Nervous, Anxious, on Edge 2 2 1 2   Control/stop worrying 1 2 2 2   Worry too much - different things 2 2 2 2   Trouble relaxing 0 1 1 1   Restless 0 0 1 1  Easily annoyed or irritable 1 2 0 1  Afraid - awful might happen 1 1 2 2   Total GAD 7 Score 7 10 9 11   Anxiety Difficulty  Somewhat difficult Somewhat difficult      Review of Systems:   Pertinent items are noted in HPI Denies abnormal vaginal discharge w/ itching/odor/irritation, headaches, visual changes, shortness of breath, chest pain, abdominal pain, severe nausea/vomiting, or problems with urination or bowel movements unless otherwise stated above. Pertinent History Reviewed:   Reviewed past medical,surgical, social, obstetrical and family history.  Reviewed problem list, medications and allergies. Physical Assessment:   Vitals:   08/31/23 1541  BP: 116/80  Pulse: 91  Weight: 263 lb (119.3 kg)  Body mass index is 43.77 kg/m.           Physical Examination:   General appearance: alert, well appearing, and in no distress  Mental status: alert, oriented to person, place, and time  Skin: warm & dry   Extremities:      Cardiovascular: normal heart rate noted  Respiratory: normal respiratory effort, no distress  Abdomen: gravid, soft, non-tender  Pelvic: Cervical exam deferred         Fetal Status:     Movement: Present    Fetal Surveillance Testing today: sonogram normal   Chaperone: N/A    No results found for this or any previous visit (from the past 24 hours).  Assessment & Plan:  High-risk pregnancy: G1P0000 at [redacted]w[redacted]d with an Estimated Date of Delivery: 11/25/23      ICD-10-CM   1. Supervision of high risk pregnancy in second  trimester  O09.92     2. Class B  DM(Type 2): metformin 500 qAM^ qhs, may have to take away am dose  O24.319     3. Chronic hypertension on Norvasc 10  O10.919     4. Insulin resistance  E88.819 metFORMIN (GLUCOPHAGE) 500 MG tablet        Meds:  Meds ordered this encounter  Medications   metFORMIN (GLUCOPHAGE) 500 MG tablet    Sig: Take 1 tablet (500 mg total) by mouth 2 (two) times daily at 8 am and 10 pm.    Dispense:  60 tablet    Refill:  3    Orders: No orders of the defined types were placed in this encounter.    Labs/procedures today: U/S  Treatment Plan:  follow up 2 weeks then begin twice weekly 2/17  Reviewed: take daily  Follow-up: Return in about 2 weeks (around 09/14/2023) for HROB.   Future Appointments  Date Time Provider Department Center  09/03/2023  8:30 AM [facility]-FTOBGYN LAB [facility]TO[facility]YN  09/28/2023  3:00 PM [facility] - FTOBGYN [place] [facility]-FTIMG None  09/28/2023  3:50 PM Lazaro Arms, MD [facility]-FT  FTOBGYN  10/26/2023  3:00 PM [facility] - FT IMG 2 [facility]-FTIMG None  11/23/2023  3:00 PM [facility] - FT IMG 2 [facility]-FTIMG None    No orders of the defined types were placed in this encounter.  Lazaro Arms  Attending Physician for the Center for [facility] 08/31/2023 4:25 PM

## 2023-08-31 NOTE — Progress Notes (Signed)
[place]:  GA = 27+5 weeks Single active female fetus,  cephalic,  FHR = 161 bpm  posterior fundal pl. Gr1 EFW 52%  1176g  AFI = 14.0 cm,  43%,  MVP = 4.9 cm   BPP = 8/8   RI: 0.63, 0.68 28% CL = 4.2 cm,  closed       normal ovaries

## 2023-09-01 ENCOUNTER — Other Ambulatory Visit (HOSPITAL_COMMUNITY): Payer: Self-pay

## 2023-09-01 ENCOUNTER — Other Ambulatory Visit: Payer: Self-pay

## 2023-09-01 LAB — CBC
Hematocrit: 35.9 % (ref 34.0–46.6)
Hemoglobin: 11.7 g/dL (ref 11.1–15.9)
MCH: 28.7 pg (ref 26.6–33.0)
MCHC: 32.6 g/dL (ref 31.5–35.7)
MCV: 88 fL (ref 79–97)
Platelets: 260 10*3/uL (ref 150–450)
RBC: 4.08 x10E6/uL (ref 3.77–5.28)
RDW: 13 % (ref 11.7–15.4)
WBC: 10 10*3/uL (ref 3.4–10.8)

## 2023-09-01 LAB — ANTIBODY SCREEN: Antibody Screen: NEGATIVE

## 2023-09-01 LAB — HIV ANTIBODY (ROUTINE TESTING W REFLEX): HIV Screen 4th Generation wRfx: NONREACTIVE

## 2023-09-01 LAB — RPR: RPR Ser Ql: NONREACTIVE

## 2023-09-03 ENCOUNTER — Other Ambulatory Visit: Payer: Commercial Managed Care - PPO

## 2023-09-10 ENCOUNTER — Encounter: Payer: Self-pay | Admitting: Obstetrics & Gynecology

## 2023-09-14 ENCOUNTER — Ambulatory Visit: Payer: Commercial Managed Care - PPO | Admitting: Obstetrics & Gynecology

## 2023-09-14 ENCOUNTER — Encounter: Payer: Self-pay | Admitting: Obstetrics & Gynecology

## 2023-09-14 VITALS — BP 119/81 | HR 94 | Wt 264.0 lb

## 2023-09-14 DIAGNOSIS — Z23 Encounter for immunization: Secondary | ICD-10-CM

## 2023-09-14 DIAGNOSIS — O24312 Unspecified pre-existing diabetes mellitus in pregnancy, second trimester: Secondary | ICD-10-CM | POA: Diagnosis not present

## 2023-09-14 DIAGNOSIS — O10912 Unspecified pre-existing hypertension complicating pregnancy, second trimester: Secondary | ICD-10-CM

## 2023-09-14 DIAGNOSIS — O10919 Unspecified pre-existing hypertension complicating pregnancy, unspecified trimester: Secondary | ICD-10-CM

## 2023-09-14 DIAGNOSIS — Z3A29 29 weeks gestation of pregnancy: Secondary | ICD-10-CM | POA: Diagnosis not present

## 2023-09-14 DIAGNOSIS — O0992 Supervision of high risk pregnancy, unspecified, second trimester: Secondary | ICD-10-CM | POA: Diagnosis not present

## 2023-09-14 DIAGNOSIS — O24319 Unspecified pre-existing diabetes mellitus in pregnancy, unspecified trimester: Secondary | ICD-10-CM

## 2023-09-14 LAB — POCT URINALYSIS DIPSTICK OB
Blood, UA: NEGATIVE
Glucose, UA: NEGATIVE
Ketones, UA: NEGATIVE
Leukocytes, UA: NEGATIVE
Nitrite, UA: NEGATIVE
POC,PROTEIN,UA: NEGATIVE

## 2023-09-14 NOTE — Progress Notes (Signed)
HIGH-RISK PREGNANCY VISIT Patient name: Alexandra Jones MRN [id number]  Date of birth: 1995-08-30 Chief Complaint:   Routine Prenatal Visit  History of Present Illness:   KIONDRA CAICEDO is a 28 y.o. G33P0000 female at [redacted]w[redacted]d with an Estimated Date of Delivery: 11/25/23 being seen today for ongoing management of a high-risk pregnancy complicated by:  -Chronic HTN -Class B DM- on metformin 500mg  bid Occasional elevated but >50% within normal range  Today she reports no complaints. Noted a period of decreased movement last week- but has now improved.  Contractions: Not present. Vag. Bleeding: None.  Movement: Present. denies leaking of fluid.      05/20/2023   10:36 AM 05/15/2023    3:44 PM 03/13/2023    9:14 AM 10/07/2022   11:07 AM 05/13/2022    8:30 AM  Depression screen PHQ 2/9  Decreased Interest 1 1 1 1  0  Down, Depressed, Hopeless 1 2 1  0 0  PHQ - 2 Score 2 3 2 1  0  Altered sleeping 0 2 1 1    Tired, decreased energy 2 2 2 1    Change in appetite 0 0 1 0   Feeling bad or failure about yourself  1 1 1  0   Trouble concentrating 0 0 0 0   Moving slowly or fidgety/restless 0 0 0 0   Suicidal thoughts 0 0 0 0   PHQ-9 Score 5 8 7 3    Difficult doing work/chores  Somewhat difficult Not difficult at all Somewhat difficult      Current Outpatient Medications  Medication Instructions   acetaminophen (TYLENOL) 500 mg, Every 6 hours PRN   amLODipine (NORVASC) 10 mg, Oral, Daily   Aspirin Low Dose 162 mg, Oral, Daily   Biotin 5 MG CAPS Take by mouth.   Blood Glucose Monitoring Suppl (ONETOUCH VERIO) w/Device KIT Use as directed to check blood sugar 4 times daily   calcium carbonate (TUMS - DOSED IN MG ELEMENTAL CALCIUM) 500 MG chewable tablet 1 tablet, As needed   Emollient (COLLAGEN EX) Apply topically. 1  scoop   escitalopram (LEXAPRO) 5 mg, Oral, Daily   escitalopram (LEXAPRO) 10 mg, Oral, Daily   famotidine (PEPCID) 20 mg, Oral, 2 times daily   glucose blood test strip Use as  instructed to check blood sugar 4 times daily   metFORMIN (GLUCOPHAGE) 500 mg, Oral, 2 times daily before breakfast and at bedtime   nystatin (MYCOSTATIN/NYSTOP) powder 1 Application, Topical, 3 times daily   ondansetron (ZOFRAN) 4 mg, Oral, Every 8 hours PRN   OneTouch Delica Lancets 33G MISC Use to check blood sugar 4 times daily   Prenatal Vit-Fe Fumarate-FA (PRENATAL VITAMIN PO) Take by mouth.   Probiotic Product (PROBIOTIC PO) Take by mouth.     Review of Systems:   Pertinent items are noted in HPI Denies abnormal vaginal discharge w/ itching/odor/irritation, headaches, visual changes, shortness of breath, chest pain, abdominal pain, severe nausea/vomiting, or problems with urination or bowel movements unless otherwise stated above. Pertinent History Reviewed:  Reviewed past medical,surgical, social, obstetrical and family history.  Reviewed problem list, medications and allergies. Physical Assessment:   Vitals:   09/14/23 1521  BP: 119/81  Pulse: 94  Weight: 264 lb (119.7 kg)  Body mass index is 43.93 kg/m.           Physical Examination:   General appearance: alert, well appearing, and in no distress  Mental status: normal mood, behavior, speech, dress, motor activity, and thought processes  Skin:  warm & dry   Extremities:      Cardiovascular: normal heart rate noted  Respiratory: normal respiratory effort, no distress  Abdomen: gravid, soft, non-tender  Pelvic: Cervical exam deferred         Fetal Status: Fetal Heart Rate (bpm): 140 Fundal Height: 28 cm Movement: Present    Fetal Surveillance Testing today: doppler   Chaperone: N/A    Results for orders placed or performed in visit on 09/14/23 (from the past 24 hours)  POC Urinalysis Dipstick OB   Collection Time: 09/14/23  3:41 PM  Result Value Ref Range   Color, UA     Clarity, UA     Glucose, UA Negative Negative   Bilirubin, UA     Ketones, UA neg    Spec Grav, UA     Blood, UA neg    pH, UA      POC,PROTEIN,UA Negative Negative, Trace, Small (1+), Moderate (2+), Large (3+), 4+   Urobilinogen, UA     Nitrite, UA neg    Leukocytes, UA Negative Negative   Appearance     Odor       Assessment & Plan:  High-risk pregnancy: G1P0000 at [redacted]w[redacted]d with an Estimated Date of Delivery: 11/25/23   1) Chronic HTN -no change in medication -continue growth q 4wks -antepartum testing to start @ 32wks  2) Class B DM -Continue with warming, no change at this time  Meds: No orders of the defined types were placed in this encounter.   Labs/procedures today: doppler, Tdap  Treatment Plan:  routine OB care and as outlined above  Reviewed: Preterm labor symptoms and general obstetric precautions including but not limited to vaginal bleeding, contractions, leaking of fluid and fetal movement were reviewed in detail with the patient.  All questions were answered. Pt has home bp cuff. Check bp weekly, let us know if >140/90.   Follow-up: Return in about 2 weeks (around 09/28/2023) for needs BPP/NST twice weekly in 2 weeks (32wk), continue growth q 4wks, HROB visit.   Future Appointments  Date Time Provider Department Center  09/28/2023  3:00 PM [facility] - FTOBGYN [place] [facility] None  09/28/2023  3:50 PM Lazaro Arms, MD [facility]-FT FTOBGYN  10/01/2023  8:50 AM [facility]-FTOBGYN NURSE [facility]TO[facility]YN  10/06/2023  3:30 PM [facility]-FTOBGYN NURSE [facility]TO[facility]YN  10/09/2023  8:30 AM [facility] - FTOBGYN [place] [facility]-FTIMG None  10/09/2023  9:50 AM Lazaro Arms, MD [facility]-FT FTOBGYN  10/13/2023  8:30 AM [facility] - FT IMG 2 [facility]-FTIMG None  10/13/2023  9:30 AM Cheral Marker, CNM [facility]-FT FTOBGYN  10/16/2023  9:10 AM [facility]-FTOBGYN NURSE [facility] FTOBGYN  10/20/2023  9:10 AM [facility]-FTOBGYN NURSE [facility]-FT FTOBGYN  10/20/2023  3:50 PM Lazaro Arms, MD [facility]-FT FTOBGYN  10/23/2023  8:30 AM [facility] - FTOBGYN [place] [facility]-FT[facility]G None  10/23/2023  9:30 AM Lazaro Arms, MD [facility]-FT FTOBGYN  10/26/2023  3:00 PM [facility] - FT IMG 2 [facility]-FTIMG None  10/26/2023  3:50 PM Myna Hidalgo, DO  [facility]-FT FTOBGYN  10/29/2023  8:50 AM [facility]-FTOBGYN NURSE [facility] FTOBGYN  11/03/2023  9:10 AM [facility]-FTOBGYN NURSE [facility]-FT FTOBGYN  11/06/2023  8:30 AM [facility] - FTOBGYN [place] [facility]-FTIMG None  11/10/2023  9:10 AM [facility]-FTOBGYN NURSE [facility]-FT FTOBGYN  11/13/2023  8:30 AM [facility] - FTOBGYN [place] [facility]-FTIMG None  11/13/2023  9:30 AM Myna Hidalgo, DO [facility]-FT FTOBGYN  11/17/2023  9:10 AM [facility]-FTOBGYN NURSE [facility] FTOBGYN  11/23/2023  3:00 PM [facility] - FT IMG 2 [facility]-FTIMG None  Orders Placed This Encounter  Procedures   Tdap vaccine greater than or equal to 7yo IM   POC Urinalysis Dipstick OB    Myna Hidalgo, DO Attending Obstetrician & Gynecologist, [facility] for [employer], [facility]

## 2023-09-25 ENCOUNTER — Other Ambulatory Visit: Payer: Self-pay | Admitting: Obstetrics & Gynecology

## 2023-09-25 DIAGNOSIS — O24419 Gestational diabetes mellitus in pregnancy, unspecified control: Secondary | ICD-10-CM

## 2023-09-25 DIAGNOSIS — O10919 Unspecified pre-existing hypertension complicating pregnancy, unspecified trimester: Secondary | ICD-10-CM

## 2023-09-28 ENCOUNTER — Ambulatory Visit: Payer: Commercial Managed Care - PPO

## 2023-09-28 ENCOUNTER — Ambulatory Visit: Payer: Commercial Managed Care - PPO | Admitting: Obstetrics & Gynecology

## 2023-09-28 ENCOUNTER — Other Ambulatory Visit (HOSPITAL_COMMUNITY): Payer: Self-pay

## 2023-09-28 ENCOUNTER — Encounter: Payer: Self-pay | Admitting: Obstetrics & Gynecology

## 2023-09-28 VITALS — BP 118/81 | HR 89 | Wt 263.0 lb

## 2023-09-28 DIAGNOSIS — O24419 Gestational diabetes mellitus in pregnancy, unspecified control: Secondary | ICD-10-CM

## 2023-09-28 DIAGNOSIS — K219 Gastro-esophageal reflux disease without esophagitis: Secondary | ICD-10-CM

## 2023-09-28 DIAGNOSIS — O0993 Supervision of high risk pregnancy, unspecified, third trimester: Secondary | ICD-10-CM

## 2023-09-28 DIAGNOSIS — O10919 Unspecified pre-existing hypertension complicating pregnancy, unspecified trimester: Secondary | ICD-10-CM

## 2023-09-28 DIAGNOSIS — Z3A31 31 weeks gestation of pregnancy: Secondary | ICD-10-CM

## 2023-09-28 DIAGNOSIS — Z141 Cystic fibrosis carrier: Secondary | ICD-10-CM

## 2023-09-28 DIAGNOSIS — O0992 Supervision of high risk pregnancy, unspecified, second trimester: Secondary | ICD-10-CM

## 2023-09-28 DIAGNOSIS — Z148 Genetic carrier of other disease: Secondary | ICD-10-CM

## 2023-09-28 DIAGNOSIS — O10913 Unspecified pre-existing hypertension complicating pregnancy, third trimester: Secondary | ICD-10-CM

## 2023-09-28 MED ORDER — FAMOTIDINE 40 MG PO TABS
40.0000 mg | ORAL_TABLET | Freq: Two times a day (BID) | ORAL | 3 refills | Status: DC
Start: 2023-09-28 — End: 2023-11-10
  Filled 2023-09-28: qty 30, 15d supply, fill #0
  Filled 2023-10-13: qty 30, 15d supply, fill #1
  Filled 2023-10-26: qty 30, 15d supply, fill #2

## 2023-09-28 NOTE — Progress Notes (Signed)
 HIGH-RISK PREGNANCY VISIT Patient name: Alexandra Jones MRN [id number]  Date of birth: [birth year] Chief Complaint:   Routine Prenatal Visit  History of Present Illness:   Alexandra Jones is a 28 y.o. G46P0000 female at [redacted]w[redacted]d with an Estimated Date of Delivery: 11/25/23 being seen today for ongoing management of a high-risk pregnancy complicated by     ICD-10-CM   1. Supervision of high risk pregnancy in second trimester  O09.92     2. [redacted] weeks gestation of pregnancy  Z3A.31     3. Gastroesophageal reflux disease without esophagitis  K21.9 famotidine (PEPCID) 40 MG tablet    4. Chronic hypertension on Norvasc 10  O10.919      BP well controlled CBG with fastings creeping up a bit  MTF BID 500  Today she reports  GERD . Contractions: Not present.  .  Movement: Present. denies leaking of fluid.      05/20/2023   10:36 AM 05/15/2023    3:44 PM 03/13/2023    9:14 AM 10/07/2022   11:07 AM 05/13/2022    8:30 AM  Depression screen PHQ 2/9  Decreased Interest 1 1 1 1  0  Down, Depressed, Hopeless 1 2 1  0 0  PHQ - 2 Score 2 3 2 1  0  Altered sleeping 0 2 1 1    Tired, decreased energy 2 2 2 1    Change in appetite 0 0 1 0   Feeling bad or failure about yourself  1 1 1  0   Trouble concentrating 0 0 0 0   Moving slowly or fidgety/restless 0 0 0 0   Suicidal thoughts 0 0 0 0   PHQ-9 Score 5 8 7 3    Difficult doing work/chores  Somewhat difficult Not difficult at all Somewhat difficult         05/20/2023   10:36 AM 05/15/2023    3:44 PM 10/07/2022   11:07 AM 02/04/2022    8:36 AM  GAD 7 : Generalized Anxiety Score  Nervous, Anxious, on Edge 2 2 1 2   Control/stop worrying 1 2 2 2   Worry too much - different things 2 2 2 2   Trouble relaxing 0 1 1 1   Restless 0 0 1 1  Easily annoyed or irritable 1 2 0 1  Afraid - awful might happen 1 1 2 2   Total GAD 7 Score 7 10 9 11   Anxiety Difficulty  Somewhat difficult Somewhat difficult      Review of Systems:   Pertinent items are noted in  HPI Denies abnormal vaginal discharge w/ itching/odor/irritation, headaches, visual changes, shortness of breath, chest pain, abdominal pain, severe nausea/vomiting, or problems with urination or bowel movements unless otherwise stated above. Pertinent History Reviewed:  Reviewed past medical,surgical, social, obstetrical and family history.  Reviewed problem list, medications and allergies. Physical Assessment:   Vitals:   09/28/23 1615  BP: 118/81  Pulse: 89  Weight: 263 lb (119.3 kg)  Body mass index is 43.77 kg/m.           Physical Examination:   General appearance: alert, well appearing, and in no distress  Mental status: alert, oriented to person, place, and time  Skin: warm & dry   Extremities: Edema: None    Cardiovascular: normal heart rate noted  Respiratory: normal respiratory effort, no distress  Abdomen: gravid, soft, non-tender  Pelvic: Cervical exam deferred         Fetal Status:     Movement: Present  Fetal Surveillance Testing today: BPP 8/8 normal UAD   Chaperone: N/A    No results found for this or any previous visit (from the past 24 hours).  Assessment & Plan:  High-risk pregnancy: G1P0000 at [redacted]w[redacted]d with an Estimated Date of Delivery: 11/25/23      ICD-10-CM   1. Supervision of high risk pregnancy in second trimester  O09.92     2. [redacted] weeks gestation of pregnancy  Z3A.31     3. Gastroesophageal reflux disease without esophagitis  K21.9 famotidine (PEPCID) 40 MG tablet    4. Chronic hypertension on Norvasc 10  O10.919        Meds:  Meds ordered this encounter  Medications   famotidine (PEPCID) 40 MG tablet    Sig: Take 1 tablet (40 mg total) by mouth 2 (two) times daily.    Dispense:  30 tablet    Refill:  3    Orders: No orders of the defined types were placed in this encounter.    Labs/procedures today: U/S  Treatment Plan:  twice weekly surveillance  Reviewed: Preterm labor symptoms and general obstetric precautions including but  not limited to vaginal bleeding, contractions, leaking of fluid and fetal movement were reviewed in detail with the patient.  All questions were answered. Does have home bp cuff. Office bp cuff given: not applicable. Check bp daily, let us know if consistently >150 and/or >95.  Follow-up: No follow-ups on file.   Future Appointments  Date Time Provider Department Center  10/06/2023  3:30 PM [facility] NURSE [facility]  10/09/2023  8:30 AM [facility] [place] [facility] None  10/09/2023  9:50 AM Lazaro Arms, MD [facility]  10/13/2023  8:30 AM [facility] 2 [facility] None  10/13/2023  9:30 AM Cheral Marker, CNM [facility]  10/16/2023  9:10 AM [facility] NURSE [facility]  10/20/2023 10:45 AM [facility] IM[facility] 2 [facility] None  10/23/2023  8:30 AM [facility] NURSE [facility]  10/23/2023  8:50 AM Lazaro Arms, MD [facility]  10/26/2023  3:00 PM [facility] FT IMG 2 [facility] None  10/26/2023  3:50 PM Myna Hidalgo, DO [facility]  10/29/2023  8:50 AM [facility] NURSE [facility]  11/03/2023  9:10 AM [facility] NURSE [facility]  11/06/2023  8:30 AM [facility] [place] [facility] None  11/06/2023  9:30 AM Lazaro Arms, MD [facility]  11/10/2023  9:10 AM [facility] NURSE [facility]  11/13/2023  8:30 AM [facility] [place] [facility] None  11/13/2023  9:30 AM Myna Hidalgo, DO [facility]  11/17/2023  9:10 AM [facility] NURSE [facility]  11/23/2023  3:00 PM [facility] - FT IMG 2 [facility] None    No orders of the defined types were placed in this encounter.  Lazaro Arms  Attending Physician for the Center for [facility] 10/05/2023 7:11 AM

## 2023-09-28 NOTE — Progress Notes (Signed)
[place] 31+5 wks,frank breech,fundal placenta gr 0,AFI 19 cm,AFI 154 bpm,BPP 8/8,RI.61,.62,.66,.65=64%,EFW 2008 g 67%

## 2023-09-29 ENCOUNTER — Other Ambulatory Visit (HOSPITAL_COMMUNITY): Payer: Self-pay

## 2023-10-01 ENCOUNTER — Other Ambulatory Visit: Payer: Commercial Managed Care - PPO

## 2023-10-06 ENCOUNTER — Ambulatory Visit: Payer: Commercial Managed Care - PPO | Admitting: *Deleted

## 2023-10-06 VITALS — BP 116/78 | HR 92 | Wt 263.0 lb

## 2023-10-06 DIAGNOSIS — I10 Essential (primary) hypertension: Secondary | ICD-10-CM | POA: Diagnosis not present

## 2023-10-06 DIAGNOSIS — Z3A32 32 weeks gestation of pregnancy: Secondary | ICD-10-CM | POA: Diagnosis not present

## 2023-10-06 DIAGNOSIS — O0993 Supervision of high risk pregnancy, unspecified, third trimester: Secondary | ICD-10-CM | POA: Diagnosis not present

## 2023-10-06 NOTE — Progress Notes (Signed)
   NURSE VISIT- NST  SUBJECTIVE:  Alexandra Jones is a 28 y.o. G73P0000 female at [redacted]w[redacted]d, here for a NST for pregnancy complicated by [facility] and Diabetes: A2/BDM}.  She reports active fetal movement, contractions: none, vaginal bleeding: none, membranes: intact.   OBJECTIVE:  BP 116/78   Pulse 92   Wt 263 lb (119.3 kg)   LMP 02/18/2023   BMI 43.77 kg/m   Appears well, no apparent distress  No results found for this or any previous visit (from the past 24 hours).  NST: FHR baseline 140 bpm, Variability: moderate, Accelerations:present, Decelerations:  Absent= Cat 1/reactive Toco: none   ASSESSMENT: G1P0000 at [redacted]w[redacted]d with CHTN and Diabetes: A2/BDM} NST reactive  PLAN: EFM strip reviewed by Dr. Despina Hidden   Recommendations: keep next appointment as scheduled    Jobe Marker  10/06/2023 4:29 PM

## 2023-10-08 ENCOUNTER — Other Ambulatory Visit: Payer: Self-pay | Admitting: Obstetrics & Gynecology

## 2023-10-08 DIAGNOSIS — O10919 Unspecified pre-existing hypertension complicating pregnancy, unspecified trimester: Secondary | ICD-10-CM

## 2023-10-09 ENCOUNTER — Ambulatory Visit (INDEPENDENT_AMBULATORY_CARE_PROVIDER_SITE_OTHER): Payer: Commercial Managed Care - PPO

## 2023-10-09 ENCOUNTER — Ambulatory Visit: Payer: Commercial Managed Care - PPO | Admitting: Obstetrics & Gynecology

## 2023-10-09 VITALS — BP 121/81 | HR 73 | Wt 261.0 lb

## 2023-10-09 DIAGNOSIS — O24419 Gestational diabetes mellitus in pregnancy, unspecified control: Secondary | ICD-10-CM | POA: Diagnosis not present

## 2023-10-09 DIAGNOSIS — Z3A33 33 weeks gestation of pregnancy: Secondary | ICD-10-CM | POA: Diagnosis not present

## 2023-10-09 DIAGNOSIS — O10913 Unspecified pre-existing hypertension complicating pregnancy, third trimester: Secondary | ICD-10-CM

## 2023-10-09 DIAGNOSIS — O10919 Unspecified pre-existing hypertension complicating pregnancy, unspecified trimester: Secondary | ICD-10-CM

## 2023-10-09 DIAGNOSIS — Z148 Genetic carrier of other disease: Secondary | ICD-10-CM

## 2023-10-09 DIAGNOSIS — O0993 Supervision of high risk pregnancy, unspecified, third trimester: Secondary | ICD-10-CM | POA: Diagnosis not present

## 2023-10-09 DIAGNOSIS — O24319 Unspecified pre-existing diabetes mellitus in pregnancy, unspecified trimester: Secondary | ICD-10-CM

## 2023-10-09 DIAGNOSIS — O0992 Supervision of high risk pregnancy, unspecified, second trimester: Secondary | ICD-10-CM

## 2023-10-09 DIAGNOSIS — Z141 Cystic fibrosis carrier: Secondary | ICD-10-CM

## 2023-10-09 DIAGNOSIS — O24313 Unspecified pre-existing diabetes mellitus in pregnancy, third trimester: Secondary | ICD-10-CM

## 2023-10-09 NOTE — Progress Notes (Signed)
 [place] 33+2 wks,cephalic,BPP 8/8,posterior fundal placenta gr 0,AFI 19 cm,FHR 152 bpm,RI .46,.60,.52=16%

## 2023-10-09 NOTE — Progress Notes (Signed)
 HIGH-RISK PREGNANCY VISIT Patient name: Alexandra Jones MRN [id number]  Date of birth: [birth year] Chief Complaint:   Routine Prenatal Visit  History of Present Illness:   Alexandra Jones is a 28 y.o. G68P0000 female at [redacted]w[redacted]d with an Estimated Date of Delivery: 11/25/23 being seen today for ongoing management of a high-risk pregnancy complicated by    ICD-10-CM   1. Supervision of high risk pregnancy in third trimester  O09.93     2. Class B  DM(Type 2): metformin [phone] qhs(sliding), generally none in the am  O24.319     3. Chronic hypertension on Norvasc 10  O10.919       .    Today she reports no complaints. Contractions: Not present. Vag. Bleeding: None.  Movement: Present. denies leaking of fluid.      05/20/2023   10:36 AM 05/15/2023    3:44 PM 03/13/2023    9:14 AM 10/07/2022   11:07 AM 05/13/2022    8:30 AM  Depression screen PHQ 2/9  Decreased Interest 1 1 1 1  0  Down, Depressed, Hopeless 1 2 1  0 0  PHQ - 2 Score 2 3 2 1  0  Altered sleeping 0 2 1 1    Tired, decreased energy 2 2 2 1    Change in appetite 0 0 1 0   Feeling bad or failure about yourself  1 1 1  0   Trouble concentrating 0 0 0 0   Moving slowly or fidgety/restless 0 0 0 0   Suicidal thoughts 0 0 0 0   PHQ-9 Score 5 8 7 3    Difficult doing work/chores  Somewhat difficult Not difficult at all Somewhat difficult         05/20/2023   10:36 AM 05/15/2023    3:44 PM 10/07/2022   11:07 AM 02/04/2022    8:36 AM  GAD 7 : Generalized Anxiety Score  Nervous, Anxious, on Edge 2 2 1 2   Control/stop worrying 1 2 2 2   Worry too much - different things 2 2 2 2   Trouble relaxing 0 1 1 1   Restless 0 0 1 1  Easily annoyed or irritable 1 2 0 1  Afraid - awful might happen 1 1 2 2   Total GAD 7 Score 7 10 9 11   Anxiety Difficulty  Somewhat difficult Somewhat difficult      Review of Systems:   Pertinent items are noted in HPI Denies abnormal vaginal discharge w/ itching/odor/irritation, headaches, visual changes,  shortness of breath, chest pain, abdominal pain, severe nausea/vomiting, or problems with urination or bowel movements unless otherwise stated above. Pertinent History Reviewed:  Reviewed past medical,surgical, social, obstetrical and family history.  Reviewed problem list, medications and allergies. Physical Assessment:   Vitals:   10/09/23 0906  BP: 121/81  Pulse: 73  Weight: 261 lb (118.4 kg)  Body mass index is 43.43 kg/m.           Physical Examination:   General appearance: alert, well appearing, and in no distress  Mental status: alert, oriented to person, place, and time  Skin: warm & dry   Extremities: Edema: None    Cardiovascular: normal heart rate noted  Respiratory: normal respiratory effort, no distress  Abdomen: gravid, soft, non-tender  Pelvic: Cervical exam deferred         Fetal Status:     Movement: Present    Fetal Surveillance Testing today: BPP 8/8 with UAD 12%   Chaperone: N/A    No results  found for this or any previous visit (from the past 24 hours).  Assessment & Plan:  High-risk pregnancy: G1P0000 at [redacted]w[redacted]d with an Estimated Date of Delivery: 11/25/23      ICD-10-CM   1. Supervision of high risk pregnancy in third trimester  O09.93     2. Class B  DM(Type 2): metformin [phone] qhs(sliding), generally none in the am  O24.319     3. Chronic hypertension on Norvasc 10  O10.919          Meds: No orders of the defined types were placed in this encounter.   Orders: No orders of the defined types were placed in this encounter.    Labs/procedures today: U/S  Treatment Plan:  twice weekly surveillance  Reviewed: Preterm labor symptoms and general obstetric precautions including but not limited to vaginal bleeding, contractions, leaking of fluid and fetal movement were reviewed in detail with the patient.  All questions were answered. Does have home bp cuff. Office bp cuff given: not applicable. Check bp daily, let us know if consistently >150  and/or >95.  Follow-up: No follow-ups on file.   Future Appointments  Date Time Provider Department Center  10/13/2023  8:30 AM [facility] - FT IMG 2 [facility] None  10/13/2023  9:30 AM Cheral Marker, CNM [facility]  10/16/2023  9:10 AM [facility] NURSE [facility]  10/20/2023 10:45 AM [facility] - FT IMG 2 [facility] None  10/23/2023  8:30 AM [facility] NURSE [facility]  10/23/2023  8:50 AM Lazaro Arms, MD [facility]  10/26/2023  3:00 PM [facility] - FT IMG 2 [facility] None  10/26/2023  3:50 PM Myna Hidalgo, DO [facility]  10/29/2023  8:50 AM [facility] NURSE [facility]  11/03/2023  9:10 AM [facility] NURSE [facility]  11/06/2023  8:30 AM [facility] [place] [facility] None  11/06/2023  9:30 AM Lazaro Arms, MD [facility]  11/10/2023  9:10 AM [facility] NURSE [facility]  11/13/2023  8:30 AM [facility] [place] [facility] None  11/13/2023  9:30 AM Myna Hidalgo, DO [facility]  11/17/2023  9:10 AM [facility] NURSE [facility]  11/23/2023  3:00 PM [facility] - FT IMG 2 [facility] None    No orders of the defined types were placed in this encounter.  Lazaro Arms  Attending Physician for the Center for [facility] 10/09/2023 10:11 AM

## 2023-10-12 ENCOUNTER — Other Ambulatory Visit: Payer: Self-pay | Admitting: Obstetrics & Gynecology

## 2023-10-12 DIAGNOSIS — O24419 Gestational diabetes mellitus in pregnancy, unspecified control: Secondary | ICD-10-CM

## 2023-10-12 DIAGNOSIS — O0992 Supervision of high risk pregnancy, unspecified, second trimester: Secondary | ICD-10-CM

## 2023-10-12 DIAGNOSIS — O10919 Unspecified pre-existing hypertension complicating pregnancy, unspecified trimester: Secondary | ICD-10-CM

## 2023-10-13 ENCOUNTER — Ambulatory Visit (INDEPENDENT_AMBULATORY_CARE_PROVIDER_SITE_OTHER): Payer: Commercial Managed Care - PPO | Admitting: Women's Health

## 2023-10-13 ENCOUNTER — Other Ambulatory Visit (HOSPITAL_COMMUNITY): Payer: Self-pay

## 2023-10-13 ENCOUNTER — Ambulatory Visit: Payer: Commercial Managed Care - PPO | Admitting: Radiology

## 2023-10-13 ENCOUNTER — Encounter: Payer: Self-pay | Admitting: Women's Health

## 2023-10-13 VITALS — BP 125/81 | HR 87 | Wt 265.0 lb

## 2023-10-13 DIAGNOSIS — O10919 Unspecified pre-existing hypertension complicating pregnancy, unspecified trimester: Secondary | ICD-10-CM

## 2023-10-13 DIAGNOSIS — O0992 Supervision of high risk pregnancy, unspecified, second trimester: Secondary | ICD-10-CM

## 2023-10-13 DIAGNOSIS — Z3A33 33 weeks gestation of pregnancy: Secondary | ICD-10-CM

## 2023-10-13 DIAGNOSIS — Z141 Cystic fibrosis carrier: Secondary | ICD-10-CM | POA: Diagnosis not present

## 2023-10-13 DIAGNOSIS — O10913 Unspecified pre-existing hypertension complicating pregnancy, third trimester: Secondary | ICD-10-CM | POA: Diagnosis not present

## 2023-10-13 DIAGNOSIS — O24319 Unspecified pre-existing diabetes mellitus in pregnancy, unspecified trimester: Secondary | ICD-10-CM

## 2023-10-13 DIAGNOSIS — Z148 Genetic carrier of other disease: Secondary | ICD-10-CM

## 2023-10-13 DIAGNOSIS — O10912 Unspecified pre-existing hypertension complicating pregnancy, second trimester: Secondary | ICD-10-CM | POA: Diagnosis not present

## 2023-10-13 DIAGNOSIS — O24419 Gestational diabetes mellitus in pregnancy, unspecified control: Secondary | ICD-10-CM

## 2023-10-13 DIAGNOSIS — O0993 Supervision of high risk pregnancy, unspecified, third trimester: Secondary | ICD-10-CM

## 2023-10-13 NOTE — Patient Instructions (Signed)
 Alexandra Jones, thank you for choosing our office today! We appreciate the opportunity to meet your healthcare needs. You may receive a short survey by mail, e-mail, or through [employer]. If you are happy with your care we would appreciate if you could take just a few minutes to complete the survey questions. We read all of your comments and take your feedback very seriously. Thank you again for choosing our office.  Center for [employer] Team at [facility]  [facility] at [facility] ([address] [place], [place] [address]) Entrance C, located off of [address] Free 24/7 valet parking   CLASSES: Go to [email or web address] to register for classes (childbirth, breastfeeding, waterbirth, infant CPR, daddy bootcamp, etc.)  Call the office [phone]) or go to [facility] if: You begin to have strong, frequent contractions Your water breaks.  Sometimes it is a big gush of fluid, sometimes it is just a trickle that keeps getting your panties wet or running down your legs You have vaginal bleeding.  It is normal to have a small amount of spotting if your cervix was checked.  You don't feel your baby moving like normal.  If you don't, get you something to eat and drink and lay down and focus on feeling your baby move.   If your baby is still not moving like normal, you should call the office or go to [facility].  Call the office [phone]) or go to [facility] hospital for these signs of pre-eclampsia: Severe headache that does not go away with Tylenol Visual changes- seeing spots, double, blurred vision Pain under your right breast or upper abdomen that does not go away with Tums or heartburn medicine Nausea and/or vomiting Severe swelling in your hands, feet, and face   Tdap Vaccine It is recommended that you get the Tdap vaccine during the third trimester of EACH pregnancy to help protect your baby from getting pertussis (whooping cough) 27-36 weeks is the BEST time to do  this so that you can pass the protection on to your baby. During pregnancy is better than after pregnancy, but if you are unable to get it during pregnancy it will be offered at the hospital.  You can get this vaccine with [place], at the health department, your family doctor, or some local pharmacies Everyone who will be around your baby should also be up-to-date on their vaccines before the baby comes. Adults (who are not pregnant) only need 1 dose of Tdap during adulthood.   [facility] Pediatricians/Family Doctors [facility]): [address] Colette Ribas, [phone]           [facility]: [address], [phone]                [facility] Medicine [facility]): [address], [phone] (call to ask if accepting patients) [facility] Department: [address], [place], [phone]    [facility] Pediatricians/Family Doctors [facility]): [phone], [phone] [facility]: [address] [place], [phone] [name]: [address], [phone]  [facility] Doctors  [facility]): [phone] [facility]: [address], [phone]   [facility] Doctors [facility]: [address] [phone]  [facility] Family Doctors  [facility]: [phone], [phone]  Home Blood Pressure Monitoring for Patients   Your provider has recommended that you check your  blood pressure (BP) at least once a week at home. If you do not have a blood pressure cuff at home, one will be provided for you. Contact your provider if you have not received your monitor within 1 week.   Helpful Tips for Accurate Home Blood Pressure Checks  Don't smoke, exercise, or drink caffeine 30 minutes before checking your BP Use the restroom before checking your BP (a full bladder can raise your  pressure) Relax in a comfortable upright chair Feet on the ground Left arm resting comfortably on a flat surface at the level of your heart Legs uncrossed Back supported Sit quietly and don't talk Place the cuff on your bare arm Adjust snuggly, so that only two fingertips can fit between your skin and the top of the cuff Check 2 readings separated by at least one minute Keep a log of your BP readings For a visual, please reference this diagram: http://ccnc.care/bpdiagram  Provider Name: [facility]     Phone: [phone]  Zone 1: ALL CLEAR  Continue to monitor your symptoms:  BP reading is less than 140 (top number) or less than 90 (bottom number)  No right upper stomach pain No headaches or seeing spots No feeling nauseated or throwing up No swelling in face and hands  Zone 2: CAUTION Call your doctor's office for any of the following:  BP reading is greater than 140 (top number) or greater than 90 (bottom number)  Stomach pain under your ribs in the middle or right side Headaches or seeing spots Feeling nauseated or throwing up Swelling in face and hands  Zone 3: EMERGENCY  Seek immediate medical care if you have any of the following:  BP reading is greater than160 (top number) or greater than 110 (bottom number) Severe headaches not improving with Tylenol Serious difficulty catching your breath Any worsening symptoms from Zone 2  Preterm Labor and Birth Information  The normal length of a pregnancy is 39-41 weeks. Preterm labor is when labor starts before 37 completed weeks of pregnancy. What are the risk factors for preterm labor? Preterm labor is more likely to occur in women who: Have certain infections during pregnancy such as a bladder infection, sexually transmitted infection, or infection inside the uterus (chorioamnionitis). Have a shorter-than-normal cervix. Have gone into preterm labor before. Have had surgery on their cervix. Are younger than age 62  or older than age 2. Are African American. Are pregnant with twins or multiple babies (multiple gestation). Take street drugs or smoke while pregnant. Do not gain enough weight while pregnant. Became pregnant shortly after having been pregnant. What are the symptoms of preterm labor? Symptoms of preterm labor include: Cramps similar to those that can happen during a menstrual period. The cramps may happen with diarrhea. Pain in the abdomen or lower back. Regular uterine contractions that may feel like tightening of the abdomen. A feeling of increased pressure in the pelvis. Increased watery or bloody mucus discharge from the vagina. Water breaking (ruptured amniotic sac). Why is it important to recognize signs of preterm labor? It is important to recognize signs of preterm labor because babies who are born prematurely may not be fully developed. This can put them at an increased risk for: Long-term (chronic) heart and lung problems. Difficulty immediately after birth with regulating body systems, including blood sugar, body temperature, heart rate, and breathing rate. Bleeding in the brain. Cerebral palsy. Learning difficulties. Death. These risks are highest for babies who are born before 34 weeks  of pregnancy. How is preterm labor treated? Treatment depends on the length of your pregnancy, your condition, and the health of your baby. It may involve: Having a stitch (suture) placed in your cervix to prevent your cervix from opening too early (cerclage). Taking or being given medicines, such as: Hormone medicines. These may be given early in pregnancy to help support the pregnancy. Medicine to stop contractions. Medicines to help mature the baby's lungs. These may be prescribed if the risk of delivery is high. Medicines to prevent your baby from developing cerebral palsy. If the labor happens before 34 weeks of pregnancy, you may need to stay in the hospital. What should I do if I  think I am in preterm labor? If you think that you are going into preterm labor, call your health care provider right away. How can I prevent preterm labor in future pregnancies? To increase your chance of having a full-term pregnancy: Do not use any tobacco products, such as cigarettes, chewing tobacco, and e-cigarettes. If you need help quitting, ask your health care provider. Do not use street drugs or medicines that have not been prescribed to you during your pregnancy. Talk with your health care provider before taking any herbal supplements, even if you have been taking them regularly. Make sure you gain a healthy amount of weight during your pregnancy. Watch for infection. If you think that you might have an infection, get it checked right away. Make sure to tell your health care provider if you have gone into preterm labor before. This information is not intended to replace advice given to you by your health care provider. Make sure you discuss any questions you have with your health care provider. Document Revised: 11/19/2018 Document Reviewed: 12/19/2015 Elsevier Patient Education  2020 [employer].

## 2023-10-13 NOTE — Progress Notes (Signed)
 [place]:  GA = 33+6 weeks Single active female fetus, cephalic, FHR = 147 bpm   posterior pl high, gr1 AFI = 10.8 cm,  18%  MVP = 5.3 cm  BPP = 8/8  RI: 0.58, 0.54  37%

## 2023-10-13 NOTE — Progress Notes (Signed)
 HIGH-RISK PREGNANCY VISIT Patient name: Alexandra Jones MRN [id number]  Date of birth: 11-Dec-1995 Chief Complaint:   Routine Prenatal Visit  History of Present Illness:   ELINORE SHULTS is a 28 y.o. G75P0000 female at [redacted]w[redacted]d with an Estimated Date of Delivery: 11/25/23 being seen today for ongoing management of a high-risk pregnancy complicated by chronic hypertension currently on norvasc 10mg , diabetes mellitus T2 on metformin 500-1000mg  , and PG BMI 41.    Today she reports  fastings 91-105 (3^), 2hr pp 76-140 (3^)- had a 76 and felt bad, had baby shower this weekend and has gotten off track w/ eating right since. Taking metformin 500-1000mg  at bedtime depending on fastings . Contractions: Not present. Vag. Bleeding: None.  Movement: Present. denies leaking of fluid.      05/20/2023   10:36 AM 05/15/2023    3:44 PM 03/13/2023    9:14 AM 10/07/2022   11:07 AM 05/13/2022    8:30 AM  Depression screen PHQ 2/9  Decreased Interest 1 1 1 1  0  Down, Depressed, Hopeless 1 2 1  0 0  PHQ - 2 Score 2 3 2 1  0  Altered sleeping 0 2 1 1    Tired, decreased energy 2 2 2 1    Change in appetite 0 0 1 0   Feeling bad or failure about yourself  1 1 1  0   Trouble concentrating 0 0 0 0   Moving slowly or fidgety/restless 0 0 0 0   Suicidal thoughts 0 0 0 0   PHQ-9 Score 5 8 7 3    Difficult doing work/chores  Somewhat difficult Not difficult at all Somewhat difficult         05/20/2023   10:36 AM 05/15/2023    3:44 PM 10/07/2022   11:07 AM 02/04/2022    8:36 AM  GAD 7 : Generalized Anxiety Score  Nervous, Anxious, on Edge 2 2 1 2   Control/stop worrying 1 2 2 2   Worry too much - different things 2 2 2 2   Trouble relaxing 0 1 1 1   Restless 0 0 1 1  Easily annoyed or irritable 1 2 0 1  Afraid - awful might happen 1 1 2 2   Total GAD 7 Score 7 10 9 11   Anxiety Difficulty  Somewhat difficult Somewhat difficult      Review of Systems:   Pertinent items are noted in HPI Denies abnormal vaginal discharge  w/ itching/odor/irritation, headaches, visual changes, shortness of breath, chest pain, abdominal pain, severe nausea/vomiting, or problems with urination or bowel movements unless otherwise stated above. Pertinent History Reviewed:  Reviewed past medical,surgical, social, obstetrical and family history.  Reviewed problem list, medications and allergies. Physical Assessment:   Vitals:   10/13/23 0914  BP: 125/81  Pulse: 87  Weight: 265 lb (120.2 kg)  Body mass index is 44.1 kg/m.           Physical Examination:   General appearance: alert, well appearing, and in no distress  Mental status: alert, oriented to person, place, and time  Skin: warm & dry   Extremities: Edema: None    Cardiovascular: normal heart rate noted  Respiratory: normal respiratory effort, no distress  Abdomen: gravid, soft, non-tender  Pelvic: Cervical exam deferred         Fetal Status:     Movement: Present    Fetal Surveillance Testing today:  [place]:  GA = 33+6 weeks Single active female fetus, cephalic, FHR = 147 bpm   posterior  pl high, gr1 AFI = 10.8 cm,  18%  MVP = 5.3 cm  BPP = 8/8  RI: 0.58, 0.54  37%    Chaperone: N/A  No results found for this or any previous visit (from the past 24 hours).  Assessment & Plan:  High-risk pregnancy: G1P0000 at [redacted]w[redacted]d with an Estimated Date of Delivery: 11/25/23   1) T2DM, on metformin 500-1000mg  at bedtime depending on fastings, few 2hrs elevated but has been eating bad this past week after baby shower, and had a 76 and felt bad, so don't want to drop too low by adding 500mg  in AMs. Last EFW 67% at 31.5. To really work on being better w/ diet this week, walk after meals, eat high protein snack at bedtime if able.   2) CHTN, stable on norvasc 10mg , ASA, reviewed pre-e s/s, reasons to seek care  3) PG BMI 41> currently 44  Meds: No orders of the defined types were placed in this encounter.  Labs/procedures today: U/S  Treatment Plan:  EFW q 4w    2x/wk testing  nst/sono     Deliver 37-39.0wks (or as per MFM w/ poor control)____   Reviewed: Preterm labor symptoms and general obstetric precautions including but not limited to vaginal bleeding, contractions, leaking of fluid and fetal movement were reviewed in detail with the patient.  All questions were answered. Does have home bp cuff. Office bp cuff given: not applicable. Check bp daily, let us know if consistently >140 and/or >90.  Follow-up: Return for As scheduled.   Future Appointments  Date Time Provider Department Center  10/16/2023  9:10 AM [facility] NURSE [facility]  10/20/2023 10:45 AM [facility] - FT IMG 2 [facility] None  10/23/2023  8:30 AM [facility] NURSE [facility]  10/23/2023  8:50 AM Lazaro Arms, MD [facility]  10/26/2023  3:00 PM [facility] - FT IMG 2 [facility] None  10/26/2023  3:50 PM Myna Hidalgo, DO [facility]  10/29/2023  8:50 AM [facility] NURSE [facility]  11/03/2023  9:10 AM [facility] NURSE [facility]  11/06/2023  8:30 AM [facility] [place] [facility] None  11/06/2023  9:30 AM Lazaro Arms, MD [facility]  11/10/2023  9:10 AM [facility] NURSE [facility]  11/13/2023  8:30 AM [facility] [place] [facility] None  11/13/2023  9:30 AM Myna Hidalgo, DO [facility]  11/17/2023  9:10 AM [facility] NURSE [facility]  11/23/2023  3:00 PM [facility] - FT IMG 2 [facility] None    No orders of the defined types were placed in this encounter.  Cheral Marker CNM, [facility] 10/13/2023 10:46 AM

## 2023-10-16 ENCOUNTER — Other Ambulatory Visit

## 2023-10-16 ENCOUNTER — Other Ambulatory Visit: Payer: Self-pay | Admitting: Obstetrics & Gynecology

## 2023-10-16 ENCOUNTER — Ambulatory Visit: Payer: Commercial Managed Care - PPO | Admitting: Obstetrics & Gynecology

## 2023-10-16 VITALS — BP 121/83 | HR 99 | Wt 261.0 lb

## 2023-10-16 DIAGNOSIS — Z3A34 34 weeks gestation of pregnancy: Secondary | ICD-10-CM

## 2023-10-16 DIAGNOSIS — O24419 Gestational diabetes mellitus in pregnancy, unspecified control: Secondary | ICD-10-CM

## 2023-10-16 DIAGNOSIS — O288 Other abnormal findings on antenatal screening of mother: Secondary | ICD-10-CM

## 2023-10-16 DIAGNOSIS — O0992 Supervision of high risk pregnancy, unspecified, second trimester: Secondary | ICD-10-CM

## 2023-10-16 DIAGNOSIS — I10 Essential (primary) hypertension: Secondary | ICD-10-CM

## 2023-10-16 DIAGNOSIS — Z148 Genetic carrier of other disease: Secondary | ICD-10-CM

## 2023-10-16 DIAGNOSIS — O10919 Unspecified pre-existing hypertension complicating pregnancy, unspecified trimester: Secondary | ICD-10-CM

## 2023-10-16 DIAGNOSIS — Z3A23 23 weeks gestation of pregnancy: Secondary | ICD-10-CM

## 2023-10-16 DIAGNOSIS — O0993 Supervision of high risk pregnancy, unspecified, third trimester: Secondary | ICD-10-CM

## 2023-10-16 DIAGNOSIS — Z141 Cystic fibrosis carrier: Secondary | ICD-10-CM

## 2023-10-16 DIAGNOSIS — O10913 Unspecified pre-existing hypertension complicating pregnancy, third trimester: Secondary | ICD-10-CM

## 2023-10-16 DIAGNOSIS — O24319 Unspecified pre-existing diabetes mellitus in pregnancy, unspecified trimester: Secondary | ICD-10-CM

## 2023-10-16 DIAGNOSIS — K219 Gastro-esophageal reflux disease without esophagitis: Secondary | ICD-10-CM

## 2023-10-16 NOTE — Progress Notes (Deleted)
   NURSE VISIT- NST  SUBJECTIVE:  Alexandra Jones is a 27 y.o. G62P0000 female at [redacted]w[redacted]d, here for a NST for pregnancy complicated by [facility] and A2DM  She reports active fetal movement, contractions: none, vaginal bleeding: none, membranes: intact.   OBJECTIVE:  BP 121/83   Pulse 99   Wt 261 lb (118.4 kg)   LMP 02/18/2023   BMI 43.43 kg/m   Appears well, no apparent distress  No results found for this or any previous visit (from the past 24 hours).  NST: FHR baseline *** bpm, Variability: {fhr variability:21617::"moderate"}, Accelerations:{DESC; PRESENT/NOT PRESENT:21021351::"present"}, Decelerations:  {fhr decel present:21619::"Absent"}= Cat ***/{fhr accels:25447::"reactive"} Toco: {obgyn contractions reg/irreg:312982}   ASSESSMENT: G1P0000 at [redacted]w[redacted]d with {NST Pregnancy complicated by:23184} NST {NST Findings:23188::"reactive"}  PLAN: EFM strip reviewed by {Blank single:19197::"Dr. Eure","Dr. Orland Jarred. Ervin","Dr. Newton","Kim Booker, CNM, WHNP","Fran Psalm Arman-Dishmon, CNM","Kim Shaw, CNM"}   Recommendations: {Blank single:19197::"keep next appointment as scheduled","work-in for BPP","send to [facility] MAU *** notified","send to [facility] L&D *** notified"}    [name]  10/16/2023 10:03 AM

## 2023-10-16 NOTE — Progress Notes (Addendum)
 [place] 34+2 wks,cephalic,BPP 8/8,RI .55,.49,.50,.49=10%,FHR 150 BPM,posterior placenta gr 1,AFI 17 cm

## 2023-10-16 NOTE — Progress Notes (Signed)
   NURSE VISIT- NST  SUBJECTIVE:  RAYNELL UPTON is a 28 y.o. G51P0000 female at [redacted]w[redacted]d, here for a NST for pregnancy complicated by [facility].  She reports active fetal movement, contractions: none, vaginal bleeding: none, membranes: intact.   OBJECTIVE:  BP 121/83   Pulse 99   Wt 261 lb (118.4 kg)   LMP 02/18/2023   BMI 43.43 kg/m   Appears well, no apparent distress  No results found for this or any previous visit (from the past 24 hours).  NST: FHR baseline 140 bpm, Variability: moderate, Accelerations:not present, Decelerations:  Absent= Cat 1/non-reactive Toco: none   ASSESSMENT: G1P0000 at [redacted]w[redacted]d with CHTN NST nonreactive  PLAN: EFM strip reviewed by Dr. Charlotta Newton   Recommendations: work-in for BPP BPP 8/8 Keep appt as scheduled    Malachy Mood  10/16/2023 11:58 AM

## 2023-10-19 ENCOUNTER — Other Ambulatory Visit: Payer: Self-pay | Admitting: Obstetrics & Gynecology

## 2023-10-19 DIAGNOSIS — I10 Essential (primary) hypertension: Secondary | ICD-10-CM

## 2023-10-19 DIAGNOSIS — O0993 Supervision of high risk pregnancy, unspecified, third trimester: Secondary | ICD-10-CM

## 2023-10-19 DIAGNOSIS — O24419 Gestational diabetes mellitus in pregnancy, unspecified control: Secondary | ICD-10-CM

## 2023-10-20 ENCOUNTER — Other Ambulatory Visit: Payer: Commercial Managed Care - PPO

## 2023-10-20 ENCOUNTER — Encounter: Payer: Commercial Managed Care - PPO | Admitting: Obstetrics & Gynecology

## 2023-10-20 ENCOUNTER — Ambulatory Visit (INDEPENDENT_AMBULATORY_CARE_PROVIDER_SITE_OTHER): Payer: Commercial Managed Care - PPO | Admitting: Radiology

## 2023-10-20 ENCOUNTER — Other Ambulatory Visit: Payer: Commercial Managed Care - PPO | Admitting: Radiology

## 2023-10-20 DIAGNOSIS — O0993 Supervision of high risk pregnancy, unspecified, third trimester: Secondary | ICD-10-CM | POA: Diagnosis not present

## 2023-10-20 DIAGNOSIS — O24419 Gestational diabetes mellitus in pregnancy, unspecified control: Secondary | ICD-10-CM

## 2023-10-20 DIAGNOSIS — Z6841 Body Mass Index (BMI) 40.0 and over, adult: Secondary | ICD-10-CM

## 2023-10-20 DIAGNOSIS — I10 Essential (primary) hypertension: Secondary | ICD-10-CM

## 2023-10-20 DIAGNOSIS — Z3A34 34 weeks gestation of pregnancy: Secondary | ICD-10-CM

## 2023-10-20 NOTE — Progress Notes (Signed)
 [place]:  GA = 34+6 weeks Single active female fetus, cephalic, FHR = 160 bpm,  posterior pl, gr1 AFI = 15.2 cm, 57%, MVP = 4.3 cm,  BPP = 8/8,  RI: 0.52, 0.53   24% Cervix appears long and closed, nl ov's

## 2023-10-21 ENCOUNTER — Other Ambulatory Visit: Payer: Self-pay | Admitting: Obstetrics & Gynecology

## 2023-10-21 DIAGNOSIS — O10919 Unspecified pre-existing hypertension complicating pregnancy, unspecified trimester: Secondary | ICD-10-CM

## 2023-10-21 DIAGNOSIS — O0993 Supervision of high risk pregnancy, unspecified, third trimester: Secondary | ICD-10-CM

## 2023-10-21 DIAGNOSIS — O24419 Gestational diabetes mellitus in pregnancy, unspecified control: Secondary | ICD-10-CM

## 2023-10-21 DIAGNOSIS — I10 Essential (primary) hypertension: Secondary | ICD-10-CM

## 2023-10-21 DIAGNOSIS — R7303 Prediabetes: Secondary | ICD-10-CM

## 2023-10-23 ENCOUNTER — Encounter: Payer: Self-pay | Admitting: Obstetrics & Gynecology

## 2023-10-23 ENCOUNTER — Other Ambulatory Visit: Payer: Commercial Managed Care - PPO

## 2023-10-23 ENCOUNTER — Encounter: Payer: Commercial Managed Care - PPO | Admitting: Obstetrics & Gynecology

## 2023-10-23 ENCOUNTER — Ambulatory Visit: Payer: Commercial Managed Care - PPO | Admitting: Obstetrics & Gynecology

## 2023-10-23 VITALS — BP 115/77 | HR 88 | Wt 262.0 lb

## 2023-10-23 DIAGNOSIS — O10913 Unspecified pre-existing hypertension complicating pregnancy, third trimester: Secondary | ICD-10-CM

## 2023-10-23 DIAGNOSIS — O24313 Unspecified pre-existing diabetes mellitus in pregnancy, third trimester: Secondary | ICD-10-CM | POA: Diagnosis not present

## 2023-10-23 DIAGNOSIS — Z3A35 35 weeks gestation of pregnancy: Secondary | ICD-10-CM | POA: Diagnosis not present

## 2023-10-23 DIAGNOSIS — O0993 Supervision of high risk pregnancy, unspecified, third trimester: Secondary | ICD-10-CM

## 2023-10-23 DIAGNOSIS — O10919 Unspecified pre-existing hypertension complicating pregnancy, unspecified trimester: Secondary | ICD-10-CM

## 2023-10-23 DIAGNOSIS — O24319 Unspecified pre-existing diabetes mellitus in pregnancy, unspecified trimester: Secondary | ICD-10-CM

## 2023-10-23 NOTE — Progress Notes (Signed)
 HIGH-RISK PREGNANCY VISIT Patient name: Alexandra Jones MRN [id number]  Date of birth: 1996/08/05 Chief Complaint:   Routine Prenatal Visit  History of Present Illness:   Alexandra Jones is a 28 y.o. G35P0000 female at [redacted]w[redacted]d with an Estimated Date of Delivery: 11/25/23 being seen today for ongoing management of a high-risk pregnancy complicated by A2DM on metformin 1000 mg close to bedtime, Norvasc 10 mg daily.    Today she reports ^GERD. Contractions: Not present. Vag. Bleeding: None.  Movement: Present. denies leaking of fluid.      05/20/2023   10:36 AM 05/15/2023    3:44 PM 03/13/2023    9:14 AM 10/07/2022   11:07 AM 05/13/2022    8:30 AM  Depression screen PHQ 2/9  Decreased Interest 1 1 1 1  0  Down, Depressed, Hopeless 1 2 1  0 0  PHQ - 2 Score 2 3 2 1  0  Altered sleeping 0 2 1 1    Tired, decreased energy 2 2 2 1    Change in appetite 0 0 1 0   Feeling bad or failure about yourself  1 1 1  0   Trouble concentrating 0 0 0 0   Moving slowly or fidgety/restless 0 0 0 0   Suicidal thoughts 0 0 0 0   PHQ-9 Score 5 8 7 3    Difficult doing work/chores  Somewhat difficult Not difficult at all Somewhat difficult         05/20/2023   10:36 AM 05/15/2023    3:44 PM 10/07/2022   11:07 AM 02/04/2022    8:36 AM  GAD 7 : Generalized Anxiety Score  Nervous, Anxious, on Edge 2 2 1 2   Control/stop worrying 1 2 2 2   Worry too much - different things 2 2 2 2   Trouble relaxing 0 1 1 1   Restless 0 0 1 1  Easily annoyed or irritable 1 2 0 1  Afraid - awful might happen 1 1 2 2   Total GAD 7 Score 7 10 9 11   Anxiety Difficulty  Somewhat difficult Somewhat difficult      Review of Systems:   Pertinent items are noted in HPI Denies abnormal vaginal discharge w/ itching/odor/irritation, headaches, visual changes, shortness of breath, chest pain, abdominal pain, severe nausea/vomiting, or problems with urination or bowel movements unless otherwise stated above. Pertinent History Reviewed:   Reviewed past medical,surgical, social, obstetrical and family history.  Reviewed problem list, medications and allergies. Physical Assessment:   Vitals:   10/23/23 0842  BP: 115/77  Pulse: 88  Weight: 262 lb (118.8 kg)  Body mass index is 43.6 kg/m.           Physical Examination:   General appearance: alert, well appearing, and in no distress  Mental status: alert, oriented to person, place, and time  Skin: warm & dry   Extremities:      Cardiovascular: normal heart rate noted  Respiratory: normal respiratory effort, no distress  Abdomen: gravid, soft, non-tender  Pelvic: Cervical exam deferred         Fetal Status:     Movement: Present    Fetal Surveillance Testing today:   Alexandra Jones is at [redacted]w[redacted]d Estimated Date of Delivery: 11/25/23  NST being performed due to A2DM + cHTN  Today the NST is Reactive  Fetal Monitoring:  Baseline: 140  bpm   good variability, reactive  The accelerations are >15 bpm and more than 2 in 20 minutes  Final diagnosis:  Reactive  NST  Lazaro Arms, MD     Chaperone:     No results found for this or any previous visit (from the past 24 hours).  Assessment & Plan:  High-risk pregnancy: G1P0000 at [redacted]w[redacted]d with an Estimated Date of Delivery: 11/25/23      ICD-10-CM   1. Supervision of high risk pregnancy in third trimester  O09.93     2. Class B  DM(Type 2): metformin 1000 qhs, take right at bedtime  O24.319    CBG 95-100 mostly, she was taking a little before bed, recommend right at bedtime    3. Chronic hypertension on Norvasc 10  O10.919         Meds: No orders of the defined types were placed in this encounter.   Orders: No orders of the defined types were placed in this encounter.    Labs/procedures today: NST  Treatment Plan:  twice weekly surveillance  Reviewed: Preterm labor symptoms and general obstetric precautions including but not limited to vaginal bleeding, contractions, leaking of fluid and fetal movement  were reviewed in detail with the patient.  All questions were answered. Does have home bp cuff. Office bp cuff given: not applicable. Check bp twice daily, let us know if consistently >150 and/or >95.  Follow-up: Return for keep scheduled.   Future Appointments  Date Time Provider Department Center  10/26/2023  3:00 PM [facility] - FT IMG 2 [facility] None  10/26/2023  3:50 PM Myna Hidalgo, DO [facility]  10/29/2023  8:50 AM [facility] NURSE [facility]  11/03/2023  9:10 AM [facility] NURSE [facility]  11/06/2023  8:30 AM [facility] - FTO[facility]YN [place] [facility] None  11/06/2023  9:30 AM Lazaro Arms, MD [facility]  11/10/2023  9:10 AM [facility] NURSE [facility]  11/13/2023  8:30 AM [facility] - [facility] [place] [facility] None  11/13/2023  9:30 AM Myna Hidalgo, DO [facility]  11/17/2023  9:10 AM [facility] NURSE [facility]  11/23/2023  3:00 PM [facility] - FT IMG 2 [facility]FT[facility] None    No orders of the defined types were placed in this encounter.  Lazaro Arms  Attending Physician for the Center for [facility] 10/23/2023 9:30 AM

## 2023-10-26 ENCOUNTER — Other Ambulatory Visit (HOSPITAL_COMMUNITY)
Admission: RE | Admit: 2023-10-26 | Discharge: 2023-10-26 | Disposition: A | Discharge status: Home / Self Care | Source: Ambulatory Visit | Attending: Obstetrics & Gynecology | Admitting: Obstetrics & Gynecology

## 2023-10-26 ENCOUNTER — Encounter: Payer: Self-pay | Admitting: Obstetrics & Gynecology

## 2023-10-26 ENCOUNTER — Other Ambulatory Visit: Payer: Self-pay

## 2023-10-26 ENCOUNTER — Other Ambulatory Visit (HOSPITAL_COMMUNITY): Payer: Self-pay

## 2023-10-26 ENCOUNTER — Ambulatory Visit: Payer: Commercial Managed Care - PPO | Admitting: Radiology

## 2023-10-26 ENCOUNTER — Ambulatory Visit (INDEPENDENT_AMBULATORY_CARE_PROVIDER_SITE_OTHER): Payer: Commercial Managed Care - PPO | Admitting: Obstetrics & Gynecology

## 2023-10-26 VITALS — BP 115/79 | HR 89 | Wt 262.4 lb

## 2023-10-26 DIAGNOSIS — O24419 Gestational diabetes mellitus in pregnancy, unspecified control: Secondary | ICD-10-CM | POA: Diagnosis not present

## 2023-10-26 DIAGNOSIS — Z3A35 35 weeks gestation of pregnancy: Secondary | ICD-10-CM

## 2023-10-26 DIAGNOSIS — O10913 Unspecified pre-existing hypertension complicating pregnancy, third trimester: Secondary | ICD-10-CM

## 2023-10-26 DIAGNOSIS — Z6841 Body Mass Index (BMI) 40.0 and over, adult: Secondary | ICD-10-CM

## 2023-10-26 DIAGNOSIS — O99213 Obesity complicating pregnancy, third trimester: Secondary | ICD-10-CM | POA: Diagnosis not present

## 2023-10-26 DIAGNOSIS — O0993 Supervision of high risk pregnancy, unspecified, third trimester: Secondary | ICD-10-CM | POA: Diagnosis not present

## 2023-10-26 DIAGNOSIS — O24313 Unspecified pre-existing diabetes mellitus in pregnancy, third trimester: Secondary | ICD-10-CM | POA: Diagnosis not present

## 2023-10-26 DIAGNOSIS — I10 Essential (primary) hypertension: Secondary | ICD-10-CM | POA: Diagnosis not present

## 2023-10-26 DIAGNOSIS — O288 Other abnormal findings on antenatal screening of mother: Secondary | ICD-10-CM

## 2023-10-26 DIAGNOSIS — O10919 Unspecified pre-existing hypertension complicating pregnancy, unspecified trimester: Secondary | ICD-10-CM

## 2023-10-26 DIAGNOSIS — O24319 Unspecified pre-existing diabetes mellitus in pregnancy, unspecified trimester: Secondary | ICD-10-CM

## 2023-10-26 DIAGNOSIS — Z9289 Personal history of other medical treatment: Secondary | ICD-10-CM

## 2023-10-26 LAB — OB RESULTS CONSOLE GBS: GBS: NEGATIVE

## 2023-10-26 NOTE — Progress Notes (Signed)
 HIGH-RISK PREGNANCY VISIT Patient name: Alexandra Jones MRN [id number]  Date of birth: 08-Feb-1996 Chief Complaint:   Routine Prenatal Visit  History of Present Illness:   Alexandra Jones is a 28 y.o. G65P0000 female at [redacted]w[redacted]d with an Estimated Date of Delivery: 11/25/23 being seen today for ongoing management of a high-risk pregnancy complicated by:  -Class B DM Well controlled with metformin  -Chronic HTN Stable on Norvasc  Today she reports no complaints.   Contractions: Not present. Vag. Bleeding: None.  Movement: Present. denies leaking of fluid.      05/20/2023   10:36 AM 05/15/2023    3:44 PM 03/13/2023    9:14 AM 10/07/2022   11:07 AM 05/13/2022    8:30 AM  Depression screen PHQ 2/9  Decreased Interest 1 1 1 1  0  Down, Depressed, Hopeless 1 2 1  0 0  PHQ - 2 Score 2 3 2 1  0  Altered sleeping 0 2 1 1    Tired, decreased energy 2 2 2 1    Change in appetite 0 0 1 0   Feeling bad or failure about yourself  1 1 1  0   Trouble concentrating 0 0 0 0   Moving slowly or fidgety/restless 0 0 0 0   Suicidal thoughts 0 0 0 0   PHQ-9 Score 5 8 7 3    Difficult doing work/chores  Somewhat difficult Not difficult at all Somewhat difficult      Current Outpatient Medications  Medication Instructions   acetaminophen (TYLENOL) 500 mg, Every 6 hours PRN   amLODipine (NORVASC) 10 mg, Oral, Daily   Aspirin Low Dose 162 mg, Oral, Daily   Biotin 5 MG CAPS Take by mouth.   Blood Glucose Monitoring Suppl (ONETOUCH VERIO) w/Device KIT Use as directed to check blood sugar 4 times daily   calcium carbonate (TUMS - DOSED IN MG ELEMENTAL CALCIUM) 500 MG chewable tablet 1 tablet, As needed   Emollient (COLLAGEN EX) Apply topically. 1  scoop   escitalopram (LEXAPRO) 5 mg, Oral, Daily   escitalopram (LEXAPRO) 10 mg, Oral, Daily   famotidine (PEPCID) 40 mg, Oral, 2 times daily   glucose blood test strip Use as instructed to check blood sugar 4 times daily   metFORMIN (GLUCOPHAGE) 500 mg, Oral, 2 times  daily before breakfast and at bedtime   nystatin (MYCOSTATIN/NYSTOP) powder 1 Application, Topical, 3 times daily   ondansetron (ZOFRAN) 4 mg, Oral, Every 8 hours PRN   OneTouch Delica Lancets 33G MISC Use to check blood sugar 4 times daily   Prenatal Vit-Fe Fumarate-FA (PRENATAL VITAMIN PO) Take by mouth.   Probiotic Product (PROBIOTIC PO) Take by mouth.     Review of Systems:   Pertinent items are noted in HPI Denies abnormal vaginal discharge w/ itching/odor/irritation, headaches, visual changes, shortness of breath, chest pain, abdominal pain, severe nausea/vomiting, or problems with urination or bowel movements unless otherwise stated above. Pertinent History Reviewed:  Reviewed past medical,surgical, social, obstetrical and family history.  Reviewed problem list, medications and allergies. Physical Assessment:   Vitals:   10/26/23 1605  BP: 115/79  Pulse: 89  Weight: 262 lb 6.4 oz (119 kg)  Body mass index is 43.67 kg/m.           Physical Examination:   General appearance: alert, well appearing, and in no distress  Mental status: normal mood, behavior, speech, dress, motor activity, and thought processes  Skin: warm & dry   Extremities: Edema: None    Cardiovascular:  normal heart rate noted  Respiratory: normal respiratory effort, no distress  Abdomen: gravid, soft, non-tender  Pelvic:  vaginal swabs obtained.  Small sebaceous cyst- near clitoral hood          Fetal Status:     Movement: Present    Fetal Surveillance Testing today: BPP 6/8, non-reactive NST --> BPP 6/10   Chaperone: Latisha Cresenzo    No results found for this or any previous visit (from the past 24 hours).   Assessment & Plan:  High-risk pregnancy: G1P0000 at [redacted]w[redacted]d with an Estimated Date of Delivery: 11/25/23   1) BPP 6/10- equivocal -plan to repeat BPP in 24hr- discussed with Dr. Bryn Gulling, able to schedule with MFM  2) Chronic HTN -stable on current meds -continue twice weekly  testing -reviewed IOL 37-39wks though due to above testing may likely lean closer to 37wks  3) Class BDM Stable on metformin  Meds: No orders of the defined types were placed in this encounter.   Labs/procedures today: BPP,NST  Treatment Plan:  as outlined above  Reviewed: Preterm labor symptoms and general obstetric precautions including but not limited to vaginal bleeding, contractions, leaking of fluid and fetal movement were reviewed in detail with the patient.  All questions were answered. Pt has home bp cuff. Check bp weekly, let us know if >140/90.   Follow-up: No follow-ups on file.   Future Appointments  Date Time Provider Department Center  10/29/2023  8:50 AM [facility] NURSE [facility]  11/03/2023  9:10 AM [facility] NURSE [facility]  11/06/2023  8:30 AM [facility] - FTO[facility]YN [place] [facility] None  11/06/2023  9:30 AM Lazaro Arms, MD [facility]  11/10/2023  9:10 AM [facility] NURSE [facility]  11/13/2023  8:30 AM [facility] - [facility]YN [place] [facility] None  11/13/2023  9:30 AM Myna Hidalgo, DO [facility]  11/17/2023  9:10 AM [facility] NURSE [facility]  11/23/2023  3:00 PM [facility] - FT IMG 2 [facility]FT[facility] None    No orders of the defined types were placed in this encounter.   Myna Hidalgo, DO Attending Obstetrician & Gynecologist, [facility] for [employer], [facility]

## 2023-10-26 NOTE — Progress Notes (Signed)
 [place]:  GA = 35+5 weeks Single active female fetus, cephalic, FHR = 155 bpm  posterior pl high, gr1 AFI = 16.2 cm,  64%, MVP = 4.4 cm,  EFW 80%, AC 94% 3058g BPP = 6/8  respirations seen but not sustained x 30 seconds over an observation  period of > 20 minutes.   RI: 0.54,0.47:  avg 0.51 21% good fetal movements and kicks

## 2023-10-27 ENCOUNTER — Ambulatory Visit

## 2023-10-27 ENCOUNTER — Other Ambulatory Visit: Payer: Self-pay

## 2023-10-27 ENCOUNTER — Other Ambulatory Visit: Payer: Self-pay | Admitting: Obstetrics & Gynecology

## 2023-10-27 ENCOUNTER — Ambulatory Visit (HOSPITAL_BASED_OUTPATIENT_CLINIC_OR_DEPARTMENT_OTHER): Admitting: Maternal & Fetal Medicine

## 2023-10-27 ENCOUNTER — Ambulatory Visit: Attending: Obstetrics and Gynecology

## 2023-10-27 VITALS — BP 126/79 | HR 91

## 2023-10-27 DIAGNOSIS — Z141 Cystic fibrosis carrier: Secondary | ICD-10-CM

## 2023-10-27 DIAGNOSIS — Z148 Genetic carrier of other disease: Secondary | ICD-10-CM

## 2023-10-27 DIAGNOSIS — Z7984 Long term (current) use of oral hypoglycemic drugs: Secondary | ICD-10-CM | POA: Diagnosis not present

## 2023-10-27 DIAGNOSIS — O24415 Gestational diabetes mellitus in pregnancy, controlled by oral hypoglycemic drugs: Secondary | ICD-10-CM | POA: Insufficient documentation

## 2023-10-27 DIAGNOSIS — I10 Essential (primary) hypertension: Secondary | ICD-10-CM

## 2023-10-27 DIAGNOSIS — O24319 Unspecified pre-existing diabetes mellitus in pregnancy, unspecified trimester: Secondary | ICD-10-CM

## 2023-10-27 DIAGNOSIS — O0993 Supervision of high risk pregnancy, unspecified, third trimester: Secondary | ICD-10-CM

## 2023-10-27 DIAGNOSIS — Z3A35 35 weeks gestation of pregnancy: Secondary | ICD-10-CM

## 2023-10-27 DIAGNOSIS — O09893 Supervision of other high risk pregnancies, third trimester: Secondary | ICD-10-CM | POA: Diagnosis not present

## 2023-10-27 DIAGNOSIS — O10913 Unspecified pre-existing hypertension complicating pregnancy, third trimester: Secondary | ICD-10-CM | POA: Diagnosis not present

## 2023-10-27 DIAGNOSIS — O10013 Pre-existing essential hypertension complicating pregnancy, third trimester: Secondary | ICD-10-CM | POA: Diagnosis not present

## 2023-10-27 DIAGNOSIS — Z7982 Long term (current) use of aspirin: Secondary | ICD-10-CM | POA: Diagnosis not present

## 2023-10-27 DIAGNOSIS — E669 Obesity, unspecified: Secondary | ICD-10-CM

## 2023-10-27 DIAGNOSIS — O99213 Obesity complicating pregnancy, third trimester: Secondary | ICD-10-CM | POA: Insufficient documentation

## 2023-10-27 DIAGNOSIS — Z8632 Personal history of gestational diabetes: Secondary | ICD-10-CM | POA: Insufficient documentation

## 2023-10-27 DIAGNOSIS — O289 Unspecified abnormal findings on antenatal screening of mother: Secondary | ICD-10-CM | POA: Diagnosis present

## 2023-10-27 DIAGNOSIS — O288 Other abnormal findings on antenatal screening of mother: Secondary | ICD-10-CM

## 2023-10-27 DIAGNOSIS — Z9289 Personal history of other medical treatment: Secondary | ICD-10-CM

## 2023-10-27 DIAGNOSIS — O368330 Maternal care for abnormalities of the fetal heart rate or rhythm, third trimester, not applicable or unspecified: Secondary | ICD-10-CM | POA: Diagnosis present

## 2023-10-27 DIAGNOSIS — Z79899 Other long term (current) drug therapy: Secondary | ICD-10-CM | POA: Insufficient documentation

## 2023-10-27 NOTE — Progress Notes (Signed)
 Patient information  Patient Name: Alexandra Jones  Patient MRN:   [id number]  Referring practice: MFM Referring Provider: [facility]  MFM CONSULT  Alexandra Jones is a 28 y.o. G1P0000 at [redacted]w[redacted]d here for ultrasound and consultation. Patient Active Problem List   Diagnosis Date Noted   Cystic fibrosis carrier 06/08/2023   Carrier of spinal muscular atrophy 06/08/2023   Chronic otitis externa 05/17/2023   Supervision of high-risk pregnancy 05/15/2023   Depression 06/26/2022   Essential hypertension 05/13/2022   Mixed hyperlipidemia 05/13/2022   Vitamin D deficiency 03/13/2022   Pre-diabetes 10/02/2020   Class 3 severe obesity with serious comorbidity and body mass index (BMI) of 40.0 to 44.9 in adult [facility]) 10/01/2020   GERD (gastroesophageal reflux disease)    Anxiety disorder 03/14/2019    Alexandra Jones has a pregnancy with the complications mentioned in the problem list. During today's visit we focused on the following concerns:   RE gestational diabetes: The patient has prediabetes and instead of a glucose challenge test she was offered to check her blood sugars and was considered to be diabetic.  She is currently on metformin 1000 mg twice daily.  Her fasting blood sugars are all less than 96 and her 2-hour postprandials are all less than 120 ever since she increased her metformin dose.  RE chronic hypertension: She is compliant with Norvasc 10 mg daily as well as prenatal aspirin for preeclampsia prophylaxis.  Today her blood pressure is 126/79.  RE BPP of 6 out of 10: The patient had a biophysical score yesterday of 6 out of 10, -2 points for breathing and a nonreactive NST that was reassuring overall.  Today she easily passed the biophysical profile ultrasound with a perfect score of 8 out of 8.  This indicates approximately 1 and 1000 risk of stillborn in the next 7 days which is very low.  I discussed the importance of monitoring fetal movement.  She has an NST scheduled for 2  days from now.  Sonographic findings Single intrauterine pregnancy. Fetal cardiac activity: Observed. Presentation: Cephalic. Interval fetal anatomy appears normal. Amniotic fluid: Within normal limits.  MVP: 3.75 cm. Placenta: Posterior. BPP: 8/8.   Recommendations 1. Continue antenatal testing 1-2x per week until delivery - next on Thursday 2. Growth [place] every 4 weeks 3. Delivery around 37-38 weeks pending clinical course  Review of Systems: A review of systems was performed and was negative except per HPI   Vitals and Physical Exam    10/27/2023   10:43 AM 10/26/2023    4:05 PM 10/23/2023    8:42 AM  Vitals with BMI  Weight  262 lbs 6 oz 262 lbs  Systolic 126 115 347  Diastolic 79 79 77  Pulse 91 89 88    Sitting comfortably on the sonogram table Nonlabored breathing Normal rate and rhythm Abdomen is nontender  Past pregnancies OB History  Gravida Para Term Preterm AB Living  1 0 0 0 0 0  SAB IAB Ectopic Multiple Live Births  0 0 0 0 0    # Outcome Date GA Lbr Len/2nd Weight Sex Type Anes PTL Lv  1 Current             I spent 30 minutes reviewing the patients chart, including labs and images as well as counseling the patient about her medical conditions. Greater than 50% of the time was spent in direct face-to-face patient counseling.  Braxton Feathers, DO Maternal fetal medicine,   10/27/2023  11:17 AM

## 2023-10-28 ENCOUNTER — Encounter: Payer: Self-pay | Admitting: Obstetrics & Gynecology

## 2023-10-28 LAB — STREP GP B NAA+RFLX: Strep Gp B NAA+Rflx: NEGATIVE

## 2023-10-28 LAB — CERVICOVAGINAL ANCILLARY ONLY
Chlamydia: NEGATIVE
Comment: NEGATIVE
Comment: NORMAL
Neisseria Gonorrhea: NEGATIVE

## 2023-10-29 ENCOUNTER — Ambulatory Visit: Payer: Commercial Managed Care - PPO | Admitting: *Deleted

## 2023-10-29 VITALS — BP 123/85 | HR 128

## 2023-10-29 DIAGNOSIS — O0993 Supervision of high risk pregnancy, unspecified, third trimester: Secondary | ICD-10-CM | POA: Diagnosis not present

## 2023-10-29 DIAGNOSIS — I10 Essential (primary) hypertension: Secondary | ICD-10-CM

## 2023-10-29 DIAGNOSIS — Z3A36 36 weeks gestation of pregnancy: Secondary | ICD-10-CM | POA: Diagnosis not present

## 2023-10-29 DIAGNOSIS — O24415 Gestational diabetes mellitus in pregnancy, controlled by oral hypoglycemic drugs: Secondary | ICD-10-CM | POA: Diagnosis not present

## 2023-10-29 NOTE — Progress Notes (Signed)
   NURSE VISIT- NST  SUBJECTIVE:  Alexandra Jones is a 28 y.o. G77P0000 female at [redacted]w[redacted]d, here for a NST for pregnancy complicated by [facility] and Diabetes: A2/BDM}.  She reports active fetal movement, contractions: none, vaginal bleeding: none, membranes: intact.   OBJECTIVE:  BP 123/85   Pulse (!) 128   LMP 02/18/2023   Appears well, no apparent distress  No results found for this or any previous visit (from the past 24 hours).  NST: FHR baseline 140 bpm, Variability: moderate, Accelerations:present, Decelerations:  Absent= Cat 1/reactive Toco: none   ASSESSMENT: G1P0000 at [redacted]w[redacted]d with CHTN and Diabetes: A2/BDM} NST reactive  PLAN: EFM strip reviewed by Dr. Despina Hidden   Recommendations: keep next appointment as scheduled    Jobe Marker  10/29/2023 10:08 AM

## 2023-11-02 ENCOUNTER — Encounter: Payer: Self-pay | Admitting: Obstetrics & Gynecology

## 2023-11-02 ENCOUNTER — Other Ambulatory Visit (HOSPITAL_COMMUNITY): Payer: Self-pay

## 2023-11-02 ENCOUNTER — Other Ambulatory Visit: Payer: Self-pay | Admitting: Obstetrics & Gynecology

## 2023-11-02 DIAGNOSIS — O0993 Supervision of high risk pregnancy, unspecified, third trimester: Secondary | ICD-10-CM

## 2023-11-02 DIAGNOSIS — I10 Essential (primary) hypertension: Secondary | ICD-10-CM

## 2023-11-02 NOTE — Progress Notes (Signed)
 IOL for 3/30

## 2023-11-03 ENCOUNTER — Telehealth (HOSPITAL_COMMUNITY): Payer: Self-pay | Admitting: *Deleted

## 2023-11-03 ENCOUNTER — Ambulatory Visit: Payer: Commercial Managed Care - PPO | Admitting: *Deleted

## 2023-11-03 ENCOUNTER — Encounter (HOSPITAL_COMMUNITY): Payer: Self-pay | Admitting: *Deleted

## 2023-11-03 VITALS — BP 124/84 | HR 108

## 2023-11-03 DIAGNOSIS — O0993 Supervision of high risk pregnancy, unspecified, third trimester: Secondary | ICD-10-CM | POA: Diagnosis not present

## 2023-11-03 DIAGNOSIS — I10 Essential (primary) hypertension: Secondary | ICD-10-CM

## 2023-11-03 DIAGNOSIS — Z3A36 36 weeks gestation of pregnancy: Secondary | ICD-10-CM | POA: Diagnosis not present

## 2023-11-03 DIAGNOSIS — O24415 Gestational diabetes mellitus in pregnancy, controlled by oral hypoglycemic drugs: Secondary | ICD-10-CM

## 2023-11-03 NOTE — Telephone Encounter (Signed)
 Preadmission screen

## 2023-11-03 NOTE — Progress Notes (Signed)
   NURSE VISIT- NST  SUBJECTIVE:  Alexandra Jones is a 28 y.o. G27P0000 female at [redacted]w[redacted]d, here for a NST for pregnancy complicated by [facility] and Diabetes: A2/BDM}.  She reports active fetal movement, contractions: none, vaginal bleeding: none, membranes: intact.   OBJECTIVE:  BP 124/84   Pulse (!) 108   LMP 02/18/2023   Appears well, no apparent distress  No results found for this or any previous visit (from the past 24 hours).  NST: FHR baseline 140 bpm, Variability: moderate, Accelerations:present, Decelerations:  Absent= Cat 1/reactive Toco: none   ASSESSMENT: G1P0000 at [redacted]w[redacted]d with CHTN and Diabetes: A2/BDM} NST reactive  PLAN: EFM strip reviewed by Dr. Charlotta Newton   Recommendations: keep next appointment as scheduled    Jobe Marker  11/03/2023 11:04 AM

## 2023-11-05 ENCOUNTER — Other Ambulatory Visit (HOSPITAL_COMMUNITY): Payer: Self-pay

## 2023-11-06 ENCOUNTER — Other Ambulatory Visit: Payer: Self-pay | Admitting: Obstetrics & Gynecology

## 2023-11-06 ENCOUNTER — Ambulatory Visit (INDEPENDENT_AMBULATORY_CARE_PROVIDER_SITE_OTHER): Payer: Commercial Managed Care - PPO

## 2023-11-06 ENCOUNTER — Ambulatory Visit: Payer: Commercial Managed Care - PPO | Admitting: Obstetrics & Gynecology

## 2023-11-06 VITALS — BP 129/84 | HR 101 | Wt 263.0 lb

## 2023-11-06 DIAGNOSIS — Z3A37 37 weeks gestation of pregnancy: Secondary | ICD-10-CM

## 2023-11-06 DIAGNOSIS — O10919 Unspecified pre-existing hypertension complicating pregnancy, unspecified trimester: Secondary | ICD-10-CM

## 2023-11-06 DIAGNOSIS — O0993 Supervision of high risk pregnancy, unspecified, third trimester: Secondary | ICD-10-CM

## 2023-11-06 DIAGNOSIS — O10913 Unspecified pre-existing hypertension complicating pregnancy, third trimester: Secondary | ICD-10-CM | POA: Diagnosis not present

## 2023-11-06 DIAGNOSIS — O24313 Unspecified pre-existing diabetes mellitus in pregnancy, third trimester: Secondary | ICD-10-CM | POA: Diagnosis not present

## 2023-11-06 DIAGNOSIS — O24419 Gestational diabetes mellitus in pregnancy, unspecified control: Secondary | ICD-10-CM | POA: Diagnosis not present

## 2023-11-06 DIAGNOSIS — Z148 Genetic carrier of other disease: Secondary | ICD-10-CM

## 2023-11-06 DIAGNOSIS — Z6841 Body Mass Index (BMI) 40.0 and over, adult: Secondary | ICD-10-CM

## 2023-11-06 DIAGNOSIS — Z141 Cystic fibrosis carrier: Secondary | ICD-10-CM

## 2023-11-06 DIAGNOSIS — I10 Essential (primary) hypertension: Secondary | ICD-10-CM | POA: Diagnosis not present

## 2023-11-06 DIAGNOSIS — O24319 Unspecified pre-existing diabetes mellitus in pregnancy, unspecified trimester: Secondary | ICD-10-CM

## 2023-11-06 NOTE — Progress Notes (Signed)
 [place] 37+2 wks,cephalic,BPP 8/8,AFI 16 cm,posterior placenta gr 1,FHR 142 bpm,RI .58,.52,.47=34%

## 2023-11-06 NOTE — Progress Notes (Signed)
 HIGH-RISK PREGNANCY VISIT Patient name: Alexandra Jones MRN [id number]  Date of birth: 1996/01/01 Chief Complaint:   Routine Prenatal Visit  History of Present Illness:   Alexandra Jones is a 28 y.o. G106P0000 female at [redacted]w[redacted]d with an Estimated Date of Delivery: 11/25/23 being seen today for ongoing management of a high-risk pregnancy complicated by     ICD-10-CM   1. Supervision of high risk pregnancy in third trimester  O09.93     2. Class B  DM(Type 2): metformin 1000 qhs, take right at bedtime  O24.319     3. Chronic hypertension on Norvasc 10  O10.919         Today she reports no complaints. Contractions: Not present. Vag. Bleeding: None.  Movement: Present. denies leaking of fluid.      05/20/2023   10:36 AM 05/15/2023    3:44 PM 03/13/2023    9:14 AM 10/07/2022   11:07 AM 05/13/2022    8:30 AM  Depression screen PHQ 2/9  Decreased Interest 1 1 1 1  0  Down, Depressed, Hopeless 1 2 1  0 0  PHQ - 2 Score 2 3 2 1  0  Altered sleeping 0 2 1 1    Tired, decreased energy 2 2 2 1    Change in appetite 0 0 1 0   Feeling bad or failure about yourself  1 1 1  0   Trouble concentrating 0 0 0 0   Moving slowly or fidgety/restless 0 0 0 0   Suicidal thoughts 0 0 0 0   PHQ-9 Score 5 8 7 3    Difficult doing work/chores  Somewhat difficult Not difficult at all Somewhat difficult         05/20/2023   10:36 AM 05/15/2023    3:44 PM 10/07/2022   11:07 AM 02/04/2022    8:36 AM  GAD 7 : Generalized Anxiety Score  Nervous, Anxious, on Edge 2 2 1 2   Control/stop worrying 1 2 2 2   Worry too much - different things 2 2 2 2   Trouble relaxing 0 1 1 1   Restless 0 0 1 1  Easily annoyed or irritable 1 2 0 1  Afraid - awful might happen 1 1 2 2   Total GAD 7 Score 7 10 9 11   Anxiety Difficulty  Somewhat difficult Somewhat difficult      Review of Systems:   Pertinent items are noted in HPI Denies abnormal vaginal discharge w/ itching/odor/irritation, headaches, visual changes, shortness of  breath, chest pain, abdominal pain, severe nausea/vomiting, or problems with urination or bowel movements unless otherwise stated above. Pertinent History Reviewed:  Reviewed past medical,surgical, social, obstetrical and family history.  Reviewed problem list, medications and allergies. Physical Assessment:   Vitals:   11/06/23 0907  BP: 129/84  Pulse: (!) 101  Weight: 263 lb (119.3 kg)  Body mass index is 43.77 kg/m.           Physical Examination:   General appearance: alert, well appearing, and in no distress  Mental status: alert, oriented to person, place, and time  Skin: warm & dry   Extremities:      Cardiovascular: normal heart rate noted  Respiratory: normal respiratory effort, no distress  Abdomen: gravid, soft, non-tender  Pelvic: Cervical exam deferred         Fetal Status:     Movement: Present    Fetal Surveillance Testing today: BPP 8/8 normal UAD   Chaperone: N/A    No results found for  this or any previous visit (from the past 24 hours).  Assessment & Plan:  High-risk pregnancy: G1P0000 at [redacted]w[redacted]d with an Estimated Date of Delivery: 11/25/23      ICD-10-CM   1. Supervision of high risk pregnancy in third trimester  O09.93     2. Class B  DM(Type 2): metformin 1000 qhs, take right at bedtime  O24.319     3. Chronic hypertension on Norvasc 10  O10.919         Meds: No orders of the defined types were placed in this encounter.   Orders: No orders of the defined types were placed in this encounter.    Labs/procedures today: U/S  Treatment Plan:  IOL tomorrow night  Follow-up: No follow-ups on file.   Future Appointments  Date Time Provider Department Center  11/08/2023 12:00 AM [facility] SCHED ROOM [facility] None    No orders of the defined types were placed in this encounter.  Lazaro Arms  Attending Physician for the Center for [facility] 11/06/2023 12:09 PM

## 2023-11-07 NOTE — H&P (Signed)
 OBSTETRIC ADMISSION HISTORY AND PHYSICAL  Collyn ILKA LOVICK is a 28 y.o. female G1P0000 with IUP at [redacted]w[redacted]d by LMP presenting for IOL for [facility] and T2DM on Metformin. She reports +FMs, No LOF, no VB, no blurry vision, headaches or peripheral edema, and RUQ pain.  She plans on breast feeding. She request Mirena IUD (placed out patient) for birth control. She received her prenatal care at [facility]   Dating: By LMP --->  Estimated Date of Delivery: 11/25/23  Sono:   @[redacted]w[redacted]d , CWD, normal anatomy, cephalic presentation, posterior placental lie, 3058  grams, 80 % EFW, 94% AC.   Prenatal History/Complications:  - CHTN on Norvasc 10 mg - T2DM on Metformin - Obesity, BMI 40-44 - Cystic Fibrosis carrier   Past Medical History: Past Medical History:  Diagnosis Date   Anxiety disorder 03/14/2019   Last Assessment & Plan:  Controlled with Lexapro 5 mg PO QD.   Diarrhea 05/01/2017   GERD (gastroesophageal reflux disease)    Gestational diabetes    High blood pressure    Lactose intolerance     Past Surgical History: Past Surgical History:  Procedure Laterality Date   CHOLECYSTECTOMY     COLONOSCOPY N/A 06/22/2017   Procedure: COLONOSCOPY;  Surgeon: West Bali, MD;  Location: [facility];  Service: Endoscopy;  Laterality: N/A;  1:45pm   WISDOM TOOTH EXTRACTION      Obstetrical History: OB History     Gravida  1   Para  0   Term  0   Preterm  0   AB  0   Living  0      SAB  0   IAB  0   Ectopic  0   Multiple  0   Live Births  0           Social History Social History   Socioeconomic History   Marital status: Significant Other    Spouse name: Not on file   Number of children: Not on file   Years of education: Not on file   Highest education level: Not on file  Occupational History   Occupation: Occupational Therapist  Tobacco Use   Smoking status: Never   Smokeless tobacco: Never  Vaping Use   Vaping status: Never Used  Substance and Sexual  Activity   Alcohol use: Not Currently    Comment: occasional   Drug use: No   Sexual activity: Yes    Birth control/protection: None  Other Topics Concern   Not on file  Social History Narrative   Not on file   Social Drivers of Health   Financial Resource Strain: Low Risk  (05/20/2023)   Overall Financial Resource Strain (CARDIA)    Difficulty of Paying Living Expenses: Not hard at all  Food Insecurity: No Food Insecurity (11/08/2023)   Hunger Vital Sign    Worried About Running Out of Food in the Last Year: Never true    Ran Out of Food in the Last Year: Never true  Transportation Needs: No Transportation Needs (11/08/2023)   PRAPARE - Administrator, Civil Service (Medical): No    Lack of Transportation (Non-Medical): No  Physical Activity: Inactive (05/20/2023)   Exercise Vital Sign    Days of Exercise per Week: 0 days    Minutes of Exercise per Session: 0 min  Stress: Stress Concern Present (05/20/2023)   [employer] of Occupational Health - Occupational Stress Questionnaire    Feeling of Stress : Rather much  Social Connections: Moderately Integrated (11/08/2023)   Social Connection and Isolation Panel [NHANES]    Frequency of Communication with Friends and Family: More than three times a week    Frequency of Social Gatherings with Friends and Family: More than three times a week    Attends Religious Services: 1 to 4 times per year    Active Member of [employer] or Organizations: No    Attends Engineer, structural: Never    Marital Status: Living with partner    Family History: Family History  Problem Relation Age of Onset   High blood pressure Mother    High Cholesterol Mother    Thyroid disease Mother    Obesity Mother    Prostate cancer Father    High blood pressure Father    High Cholesterol Father    Depression Father    Obesity Father    Hypertension Maternal Grandmother    Breast cancer Maternal Grandmother 68   Kidney failure  Maternal Grandmother    Colon cancer Maternal Grandfather 60   Heart failure Maternal Grandfather    Prostate cancer Maternal Grandfather    Celiac disease Neg Hx    Crohn's disease Neg Hx    Ulcerative colitis Neg Hx     Allergies: Allergies  Allergen Reactions   Hydrocodone Hives   Penicillins Rash    fever Has patient had a PCN reaction causing immediate rash, facial/tongue/throat swelling, SOB or lightheadedness with hypotension: Yes Has patient had a PCN reaction causing severe rash involving mucus membranes or skin necrosis: No Has patient had a PCN reaction that required hospitalization: No Has patient had a PCN reaction occurring within the last 10 years: Yes If all of the above answers are "NO", then may proceed with Cephalosporin use.     Medications Prior to Admission  Medication Sig Dispense Refill Last Dose/Taking   ondansetron (ZOFRAN) 4 MG tablet Take 1 tablet (4 mg total) by mouth every 8 (eight) hours as needed for nausea or vomiting. (Patient not taking: Reported on 11/06/2023) 20 tablet 0    acetaminophen (TYLENOL) 500 MG tablet Take 500 mg by mouth every 6 (six) hours as needed.      amLODipine (NORVASC) 10 MG tablet Take 1 tablet (10 mg total) by mouth daily. 90 tablet 3    aspirin 81 MG chewable tablet Chew 2 tablets (162 mg total) by mouth daily. 60 tablet 7    Biotin 5 MG CAPS Take by mouth.      Blood Glucose Monitoring Suppl (ONETOUCH VERIO) w/Device KIT Use as directed to check blood sugar 4 times daily 1 kit 0    calcium carbonate (TUMS - DOSED IN MG ELEMENTAL CALCIUM) 500 MG chewable tablet Chew 1 tablet by mouth as needed.      Emollient (COLLAGEN EX) Apply topically. 1  scoop      escitalopram (LEXAPRO) 10 MG tablet Take 1 tablet (10 mg total) by mouth daily. 90 tablet 1    escitalopram (LEXAPRO) 5 MG tablet Take 1 tablet (5 mg total) by mouth daily. 90 tablet 1    famotidine (PEPCID) 40 MG tablet Take 1 tablet (40 mg total) by mouth 2 (two) times  daily. 30 tablet 3    glucose blood test strip Use as instructed to check blood sugar 4 times daily 100 each 12    metFORMIN (GLUCOPHAGE) 500 MG tablet Take 1 tablet (500 mg total) by mouth 2 (two) times daily at 8 am and 10 pm. (Patient taking  differently: Take 500 mg by mouth 2 (two) times daily at 8 am and 10 pm. Taking 1,000 at bedtime) 60 tablet 3    nystatin (MYCOSTATIN/NYSTOP) powder Apply 1 Application topically 3 (three) times daily. (Patient not taking: Reported on 11/06/2023) 60 g 1    OneTouch Delica Lancets 33G MISC Use to check blood sugar 4 times daily 100 each 12    Prenatal Vit-Fe Fumarate-FA (PRENATAL VITAMIN PO) Take by mouth.      Probiotic Product (PROBIOTIC PO) Take by mouth.        Review of Systems   All systems reviewed and negative except as stated in HPI  Blood pressure 126/78, pulse 96, resp. rate 18, last menstrual period 02/18/2023. General appearance: alert, cooperative, appears stated age, and no distress Lungs: clear to auscultation bilaterally Heart: regular rate and rhythm Abdomen: soft, non-tender; bowel sounds normal Pelvic: adequate, unproven Extremities: Homans sign is negative, no sign of DVT DTR's 2+ Presentation: cephalic Fetal monitoringBaseline: 140 bpm, Variability: Good {> 6 bpm), Accelerations: Reactive, and Decelerations: Absent Uterine activityFrequency: 2 times per hour Dilation: 1 Effacement (%): 50 Station: -3 Exam by:: Dr Leanora Cover   Prenatal labs: ABO, Rh: --/--/O POS (03/30 0023) Antibody: NEG (03/30 0023) Rubella: 2.67 (10/09 1636) RPR: Non Reactive (01/20 1644)  HBsAg: Negative (10/09 1636)  HIV: Non Reactive (01/20 1644)  GBS: --Theda Sers (03/17 1630)    Lab Results  Component Value Date   GBS Negative 10/26/2023   GTT not done Genetic screening  INCREASED CARRIER RISK for Spinal Muscular Atrophy, Positive for the pathogenic variant c.[id number] (p.F508del) in the CFTR gene.  Anatomy US normal  Immunization  History  Administered Date(s) Administered   DTaP 12/10/1993, 02/10/1994, 04/18/1994, 01/20/1995   HIB (PRP-OMP) 12/10/1993, 02/10/1994, 04/18/1994, 01/20/1995   HPV Quadrivalent 03/06/2011, 05/08/2011, 09/15/2011   Hepatitis B, PED/ADOLESCENT 12/10/1993, 02/10/1994, 07/18/1994   IPV 12/10/1993, 02/10/1994, 04/18/1994   Influenza Inj Mdck Quad Pf 05/31/2019   Influenza,inj,Quad PF,6+ Mos 06/03/2018, 05/21/2021, 05/13/2022   Influenza-Unspecified 06/10/2023   MMR 01/20/1995, 02/13/2012   Meningococcal B Recombinant 11/10/2011   Meningococcal Conjugate 11/10/2011   Moderna SARS-COV2 Booster Vaccination 07/12/2020   Moderna Sars-Covid-2 Vaccination 11/10/2019, 12/14/2019   PPD Test 03/08/2018, 03/24/2018, 03/16/2019, 03/26/2020, 05/21/2021   Tdap 09/27/2010, 09/14/2023   Varicella 01/09/1995, 03/08/2018    Prenatal Transfer Tool  Maternal Diabetes: Yes:  Diabetes Type:  Pre-pregnancy, Insulin/Medication controlled Genetic Screening: Abnormal:  Results: Other: Positive for the pathogenic variant c.[id number] (p.F508del) in the CFTR gene. INCREASED CARRIER RISK for Spinal Muscular Atrophy Maternal Ultrasounds/Referrals: Normal Fetal Ultrasounds or other Referrals:  None Maternal Substance Abuse:  No Significant Maternal Medications:  None Significant Maternal Lab Results: Group B Strep negative Number of Prenatal Visits:greater than 3 verified prenatal visits Maternal Vaccinations:TDap and Flu Other Comments:  None   Results for orders placed or performed during the hospital encounter of 11/08/23 (from the past 24 hours)  Comprehensive metabolic panel   Collection Time: 11/08/23 12:13 AM  Result Value Ref Range   Sodium 137 135 - 145 mmol/L   Potassium 3.8 3.5 - 5.1 mmol/L   Chloride 108 98 - 111 mmol/L   CO2 16 (L) 22 - 32 mmol/L   Glucose, Bld 104 (H) 70 - 99 mg/dL   BUN 11 6 - 20 mg/dL   Creatinine, Ser 1.61 0.44 - 1.00 mg/dL   Calcium 8.8 (L) 8.9 - 10.3 mg/dL   Total  Protein 6.2 (L) 6.5 - 8.1 g/dL   Albumin 2.6 (L) 3.5 -  5.0 g/dL   AST 17 15 - 41 U/L   ALT 13 0 - 44 U/L   Alkaline Phosphatase 161 (H) 38 - 126 U/L   Total Bilirubin 0.3 0.0 - 1.2 mg/dL   GFR, Estimated >16 >10 mL/min   Anion gap 13 5 - 15  CBC   Collection Time: 11/08/23 12:13 AM  Result Value Ref Range   WBC 9.5 4.0 - 10.5 K/uL   RBC 4.13 3.87 - 5.11 MIL/uL   Hemoglobin 11.7 (L) 12.0 - 15.0 g/dL   HCT 96.0 (L) 45.4 - 09.8 %   MCV 85.0 80.0 - 100.0 fL   MCH 28.3 26.0 - 34.0 pg   MCHC 33.3 30.0 - 36.0 g/dL   RDW 11.9 14.7 - 82.9 %   Platelets 216 150 - 400 K/uL   nRBC 0.0 0.0 - 0.2 %  Type and screen   Collection Time: 11/08/23 12:23 AM  Result Value Ref Range   ABO/RH(D) O POS    Antibody Screen NEG    Sample Expiration      11/11/2023,2359 Performed at [facility], [address] [place], [place] [address]     Patient Active Problem List   Diagnosis Date Noted   Intrauterine pregnancy 11/08/2023   Gestational diabetes mellitus (GDM) controlled on oral hypoglycemic drug 10/27/2023   Cystic fibrosis carrier 06/08/2023   Carrier of spinal muscular atrophy 06/08/2023   Chronic otitis externa 05/17/2023   Supervision of high-risk pregnancy 05/15/2023   Depression 06/26/2022   Essential hypertension 05/13/2022   Mixed hyperlipidemia 05/13/2022   Vitamin D deficiency 03/13/2022   Pre-diabetes 10/02/2020   Class 3 severe obesity with serious comorbidity and body mass index (BMI) of 40.0 to 44.9 in adult [facility]) 10/01/2020   GERD (gastroesophageal reflux disease)    Anxiety disorder 03/14/2019    Assessment/Plan:  ORLA ESTRIN is a 28 y.o. G1P0000 at [redacted]w[redacted]d here for IOL for T2DM on metformin and CHTN on Norvasc 10 mg   #Labor: Dual cytotec; FB as able.  #Pain: Pt aware of options #FWB: Cat I #GBS status:  negative #Feeding: Breastmilk  #Reproductive Life planning: IUD Mirena at postpartum visit #Circ:  not applicable  #BMI 40-44  #T2DM on Metformin;  Type B - q4hr CBG in latent labor, q2hr in active labor, continue metformin; Endotool as needed for recurrently elevated CBG  #CHTN on Norvasc 10 mg  - Continue Norvasc; CMP, CBC, Urine pro/Cr   Wyn Forster, MD  11/08/2023, 1:25 AM

## 2023-11-08 ENCOUNTER — Inpatient Hospital Stay (HOSPITAL_COMMUNITY): Admitting: Anesthesiology

## 2023-11-08 ENCOUNTER — Inpatient Hospital Stay (HOSPITAL_COMMUNITY)
Admission: RE | Admit: 2023-11-08 | Discharge: 2023-11-10 | DRG: 807 | Disposition: A | Discharge status: Home / Self Care | Attending: Family Medicine | Admitting: Family Medicine

## 2023-11-08 ENCOUNTER — Inpatient Hospital Stay (HOSPITAL_COMMUNITY)

## 2023-11-08 ENCOUNTER — Other Ambulatory Visit: Payer: Self-pay

## 2023-11-08 ENCOUNTER — Encounter (HOSPITAL_COMMUNITY): Payer: Self-pay | Admitting: Obstetrics & Gynecology

## 2023-11-08 DIAGNOSIS — O1002 Pre-existing essential hypertension complicating childbirth: Secondary | ICD-10-CM | POA: Diagnosis not present

## 2023-11-08 DIAGNOSIS — Z141 Cystic fibrosis carrier: Secondary | ICD-10-CM

## 2023-11-08 DIAGNOSIS — Z3A37 37 weeks gestation of pregnancy: Secondary | ICD-10-CM

## 2023-11-08 DIAGNOSIS — O24425 Gestational diabetes mellitus in childbirth, controlled by oral hypoglycemic drugs: Secondary | ICD-10-CM | POA: Diagnosis not present

## 2023-11-08 DIAGNOSIS — R7303 Prediabetes: Secondary | ICD-10-CM | POA: Diagnosis present

## 2023-11-08 DIAGNOSIS — O99214 Obesity complicating childbirth: Secondary | ICD-10-CM | POA: Diagnosis present

## 2023-11-08 DIAGNOSIS — E66813 Obesity, class 3: Secondary | ICD-10-CM | POA: Diagnosis not present

## 2023-11-08 DIAGNOSIS — O24415 Gestational diabetes mellitus in pregnancy, controlled by oral hypoglycemic drugs: Secondary | ICD-10-CM | POA: Diagnosis present

## 2023-11-08 DIAGNOSIS — Z880 Allergy status to penicillin: Secondary | ICD-10-CM | POA: Diagnosis not present

## 2023-11-08 DIAGNOSIS — O9962 Diseases of the digestive system complicating childbirth: Secondary | ICD-10-CM | POA: Diagnosis not present

## 2023-11-08 DIAGNOSIS — I10 Essential (primary) hypertension: Secondary | ICD-10-CM | POA: Diagnosis present

## 2023-11-08 DIAGNOSIS — E559 Vitamin D deficiency, unspecified: Secondary | ICD-10-CM | POA: Diagnosis not present

## 2023-11-08 DIAGNOSIS — O1092 Unspecified pre-existing hypertension complicating childbirth: Secondary | ICD-10-CM | POA: Diagnosis not present

## 2023-11-08 DIAGNOSIS — Z7982 Long term (current) use of aspirin: Secondary | ICD-10-CM

## 2023-11-08 DIAGNOSIS — K219 Gastro-esophageal reflux disease without esophagitis: Secondary | ICD-10-CM | POA: Diagnosis present

## 2023-11-08 DIAGNOSIS — O0993 Supervision of high risk pregnancy, unspecified, third trimester: Secondary | ICD-10-CM

## 2023-11-08 DIAGNOSIS — Z79899 Other long term (current) drug therapy: Secondary | ICD-10-CM

## 2023-11-08 DIAGNOSIS — O24424 Gestational diabetes mellitus in childbirth, insulin controlled: Secondary | ICD-10-CM | POA: Diagnosis not present

## 2023-11-08 DIAGNOSIS — Z8632 Personal history of gestational diabetes: Secondary | ICD-10-CM | POA: Diagnosis present

## 2023-11-08 DIAGNOSIS — Z148 Genetic carrier of other disease: Secondary | ICD-10-CM

## 2023-11-08 DIAGNOSIS — O0990 Supervision of high risk pregnancy, unspecified, unspecified trimester: Secondary | ICD-10-CM

## 2023-11-08 DIAGNOSIS — F419 Anxiety disorder, unspecified: Secondary | ICD-10-CM | POA: Diagnosis present

## 2023-11-08 DIAGNOSIS — Z3490 Encounter for supervision of normal pregnancy, unspecified, unspecified trimester: Principal | ICD-10-CM

## 2023-11-08 DIAGNOSIS — O99344 Other mental disorders complicating childbirth: Secondary | ICD-10-CM | POA: Diagnosis present

## 2023-11-08 LAB — TYPE AND SCREEN
ABO/RH(D): O POS
Antibody Screen: NEGATIVE

## 2023-11-08 LAB — CBC
HCT: 35.1 % — ABNORMAL LOW (ref 36.0–46.0)
HCT: 36.5 % (ref 36.0–46.0)
Hemoglobin: 11.7 g/dL — ABNORMAL LOW (ref 12.0–15.0)
Hemoglobin: 12.4 g/dL (ref 12.0–15.0)
MCH: 28.3 pg (ref 26.0–34.0)
MCH: 28.8 pg (ref 26.0–34.0)
MCHC: 33.3 g/dL (ref 30.0–36.0)
MCHC: 34 g/dL (ref 30.0–36.0)
MCV: 84.7 fL (ref 80.0–100.0)
MCV: 85 fL (ref 80.0–100.0)
Platelets: 213 10*3/uL (ref 150–400)
Platelets: 216 10*3/uL (ref 150–400)
RBC: 4.13 MIL/uL (ref 3.87–5.11)
RBC: 4.31 MIL/uL (ref 3.87–5.11)
RDW: 14.3 % (ref 11.5–15.5)
RDW: 14.3 % (ref 11.5–15.5)
WBC: 10.7 10*3/uL — ABNORMAL HIGH (ref 4.0–10.5)
WBC: 9.5 10*3/uL (ref 4.0–10.5)
nRBC: 0 % (ref 0.0–0.2)
nRBC: 0 % (ref 0.0–0.2)

## 2023-11-08 LAB — GLUCOSE, CAPILLARY
Glucose-Capillary: 107 mg/dL — ABNORMAL HIGH (ref 70–99)
Glucose-Capillary: 105 mg/dL — ABNORMAL HIGH (ref 70–99)
Glucose-Capillary: 118 mg/dL — ABNORMAL HIGH (ref 70–99)
Glucose-Capillary: 91 mg/dL (ref 70–99)
Glucose-Capillary: 98 mg/dL (ref 70–99)
Glucose-Capillary: 96 mg/dL (ref 70–99)

## 2023-11-08 LAB — COMPREHENSIVE METABOLIC PANEL WITH GFR
ALT: 13 U/L (ref 0–44)
AST: 17 U/L (ref 15–41)
Albumin: 2.6 g/dL — ABNORMAL LOW (ref 3.5–5.0)
Alkaline Phosphatase: 161 U/L — ABNORMAL HIGH (ref 38–126)
Anion gap: 13 (ref 5–15)
BUN: 11 mg/dL (ref 6–20)
CO2: 16 mmol/L — ABNORMAL LOW (ref 22–32)
Calcium: 8.8 mg/dL — ABNORMAL LOW (ref 8.9–10.3)
Chloride: 108 mmol/L (ref 98–111)
Creatinine, Ser: 0.59 mg/dL (ref 0.44–1.00)
GFR, Estimated: >60 mL/min (ref >60)
Glucose, Bld: 104 mg/dL — ABNORMAL HIGH (ref 70–99)
Potassium: 3.8 mmol/L (ref 3.5–5.1)
Sodium: 137 mmol/L (ref 135–145)
Total Bilirubin: 0.3 mg/dL (ref 0.0–1.2)
Total Protein: 6.2 g/dL — ABNORMAL LOW (ref 6.5–8.1)

## 2023-11-08 LAB — PROTEIN / CREATININE RATIO, URINE
Creatinine, Urine: 27 mg/dL
Total Protein, Urine: <6 mg/dL

## 2023-11-08 LAB — RPR: RPR Ser Ql: NONREACTIVE

## 2023-11-08 MED ORDER — FENTANYL-BUPIVACAINE-NACL 0.5-0.125-0.9 MG/250ML-% EP SOLN
12.0000 mL/h | EPIDURAL | Status: DC | PRN
  Administered 2023-11-08: 12 mL/h via EPIDURAL
  Filled 2023-11-08: qty 250

## 2023-11-08 MED ORDER — MISOPROSTOL 50MCG HALF TABLET
50.0000 ug | ORAL_TABLET | Freq: Once | ORAL | Status: AC
  Administered 2023-11-08: 50 ug via ORAL
  Filled 2023-11-08: qty 1

## 2023-11-08 MED ORDER — METFORMIN HCL 500 MG PO TABS
500.0000 mg | ORAL_TABLET | Freq: Two times a day (BID) | ORAL | Status: DC
  Filled 2023-11-08: qty 1

## 2023-11-08 MED ORDER — TERBUTALINE SULFATE 1 MG/ML IJ SOLN: 0.2500 mg | Freq: Once | INTRAMUSCULAR | Status: DC | PRN

## 2023-11-08 MED ORDER — AMLODIPINE BESYLATE 5 MG PO TABS
10.0000 mg | ORAL_TABLET | Freq: Every day | ORAL | Status: DC
  Administered 2023-11-09 (×2): 10 mg via ORAL
  Filled 2023-11-08: qty 1
  Filled 2023-11-08: qty 2

## 2023-11-08 MED ORDER — FAMOTIDINE 20 MG PO TABS
40.0000 mg | ORAL_TABLET | Freq: Two times a day (BID) | ORAL | Status: DC
  Administered 2023-11-08: 40 mg via ORAL
  Filled 2023-11-08 (×2): qty 2

## 2023-11-08 MED ORDER — ESCITALOPRAM OXALATE 10 MG PO TABS: 10.0000 mg | ORAL_TABLET | Freq: Every day | ORAL | Status: DC

## 2023-11-08 MED ORDER — SOD CITRATE-CITRIC ACID 500-334 MG/5ML PO SOLN: 30.0000 mL | ORAL | Status: DC | PRN

## 2023-11-08 MED ORDER — ESCITALOPRAM OXALATE 10 MG PO TABS
15.0000 mg | ORAL_TABLET | Freq: Every day | ORAL | Status: DC
  Administered 2023-11-08 – 2023-11-09 (×2): 15 mg via ORAL
  Filled 2023-11-08: qty 2
  Filled 2023-11-08: qty 1.5

## 2023-11-08 MED ORDER — OXYTOCIN BOLUS FROM INFUSION
333.0000 mL | Freq: Once | INTRAVENOUS | Status: AC
  Administered 2023-11-09: 333 mL via INTRAVENOUS

## 2023-11-08 MED ORDER — FENTANYL CITRATE (PF) 100 MCG/2ML IJ SOLN: 50.0000 ug | INTRAMUSCULAR | Status: DC | PRN

## 2023-11-08 MED ORDER — ONDANSETRON HCL 4 MG/2ML IJ SOLN: 4.0000 mg | Freq: Four times a day (QID) | INTRAMUSCULAR | Status: DC | PRN

## 2023-11-08 MED ORDER — LACTATED RINGERS IV SOLN: INTRAVENOUS | Status: AC

## 2023-11-08 MED ORDER — ESCITALOPRAM OXALATE 10 MG PO TABS
5.0000 mg | ORAL_TABLET | Freq: Every day | ORAL | Status: DC
  Filled 2023-11-08: qty 0.5

## 2023-11-08 MED ORDER — OXYCODONE-ACETAMINOPHEN 5-325 MG PO TABS: 1.0000 | ORAL_TABLET | ORAL | Status: DC | PRN

## 2023-11-08 MED ORDER — PHENYLEPHRINE 80 MCG/ML (10ML) SYRINGE FOR IV PUSH (FOR BLOOD PRESSURE SUPPORT)
80.0000 ug | PREFILLED_SYRINGE | INTRAVENOUS | Status: DC | PRN
  Filled 2023-11-08: qty 10

## 2023-11-08 MED ORDER — OXYTOCIN-SODIUM CHLORIDE 30-0.9 UT/500ML-% IV SOLN
2.5000 [IU]/h | INTRAVENOUS | Status: DC
  Filled 2023-11-08: qty 500

## 2023-11-08 MED ORDER — MISOPROSTOL 25 MCG QUARTER TABLET
25.0000 ug | ORAL_TABLET | Freq: Once | ORAL | Status: AC
  Administered 2023-11-08: 25 ug via VAGINAL
  Filled 2023-11-08: qty 1

## 2023-11-08 MED ORDER — LIDOCAINE HCL (PF) 1 % IJ SOLN
30.0000 mL | INTRAMUSCULAR | Status: DC | PRN
Start: 2023-11-08 — End: 2023-11-10

## 2023-11-08 MED ORDER — AMLODIPINE BESYLATE 10 MG PO TABS
10.0000 mg | ORAL_TABLET | Freq: Every day | ORAL | Status: DC
  Filled 2023-11-08: qty 1

## 2023-11-08 MED ORDER — OXYCODONE-ACETAMINOPHEN 5-325 MG PO TABS: 2.0000 | ORAL_TABLET | ORAL | Status: DC | PRN

## 2023-11-08 MED ORDER — ACETAMINOPHEN 325 MG PO TABS: 650.0000 mg | ORAL_TABLET | ORAL | Status: DC | PRN

## 2023-11-08 MED ORDER — EPHEDRINE 5 MG/ML INJ: 10.0000 mg | INTRAVENOUS | Status: DC | PRN

## 2023-11-08 MED ORDER — PHENYLEPHRINE 80 MCG/ML (10ML) SYRINGE FOR IV PUSH (FOR BLOOD PRESSURE SUPPORT): 80.0000 ug | PREFILLED_SYRINGE | INTRAVENOUS | Status: DC | PRN

## 2023-11-08 MED ORDER — MISOPROSTOL 25 MCG QUARTER TABLET
25.0000 ug | ORAL_TABLET | Freq: Once | ORAL | Status: AC
  Administered 2023-11-08: 25 ug via ORAL
  Filled 2023-11-08: qty 1

## 2023-11-08 MED ORDER — ESCITALOPRAM OXALATE 10 MG PO TABS: 5.0000 mg | ORAL_TABLET | Freq: Every day | ORAL | Status: DC

## 2023-11-08 MED ORDER — DIPHENHYDRAMINE HCL 50 MG/ML IJ SOLN: 12.5000 mg | INTRAMUSCULAR | Status: DC | PRN

## 2023-11-08 MED ORDER — LIDOCAINE HCL (PF) 1 % IJ SOLN
INTRAMUSCULAR | Status: DC | PRN
  Administered 2023-11-08 (×2): 4 mL via EPIDURAL

## 2023-11-08 MED ORDER — ESCITALOPRAM OXALATE 10 MG PO TABS
10.0000 mg | ORAL_TABLET | Freq: Every day | ORAL | Status: DC
  Filled 2023-11-08: qty 1

## 2023-11-08 MED ORDER — LACTATED RINGERS IV SOLN
500.0000 mL | Freq: Once | INTRAVENOUS | Status: AC
  Administered 2023-11-08: 500 mL via INTRAVENOUS

## 2023-11-08 MED ORDER — LACTATED RINGERS IV SOLN: 500.0000 mL | INTRAVENOUS | Status: AC | PRN

## 2023-11-08 MED ORDER — METFORMIN HCL 500 MG PO TABS
1000.0000 mg | ORAL_TABLET | Freq: Every day | ORAL | Status: DC
  Administered 2023-11-09 (×2): 1000 mg via ORAL
  Filled 2023-11-08 (×2): qty 2

## 2023-11-08 MED ORDER — OXYTOCIN-SODIUM CHLORIDE 30-0.9 UT/500ML-% IV SOLN
1.0000 m[IU]/min | INTRAVENOUS | Status: DC
  Administered 2023-11-08: 2 m[IU]/min via INTRAVENOUS
  Administered 2023-11-09: 24 m[IU]/min via INTRAVENOUS

## 2023-11-08 NOTE — Progress Notes (Addendum)
 Labor Progress Note Alexandra Jones is a 28 y.o. G1P0000 at [redacted]w[redacted]d who presented for IOL for cHTN.  S: Patient reports things are a little "rough" due to anxiety. Also feeling contractions more. Thinking she will likely want an epidural and will let her nurse know when she decides.  O:  BP 131/82   Pulse 89   Temp 98.3 F (36.8 C) (Oral)   Resp 18   Ht 5\' 5"  (1.651 m)   Wt 119.3 kg   LMP 02/18/2023   BMI 43.75 kg/m  FHT: 150bpm baseline, moderate variability, + accels, no decels CTX: q2-34min on IUPC  CVE: Dilation: 4 Effacement (%): 70 Cervical Position: Anterior Station: -1 Presentation: Vertex Exam by:: Jonah Blue CNM   A&P:  Alexandra Jones is a 28 y.o. G1P0000 at [redacted]w[redacted]d who presented for IOL for cHTN.  --IOL: Most recent SVE 4/70/-1 at 16:45. Plan to continue pitocin (currently at 52mL/hr).         --S/p cytotec         --AROM with light meconium at 16:45         --IUPC in place. Continue to titrate pitocin to achieve adequate MVUs.         --Considering epidural for pain control --FWB: Category I, reassuring. Continue to monitor. EFW 80% with AC 94% on 03/17 [place]. --cHTN: Asymptomatic, BP labs WNL. Amlodipine 10mg  qHS. BP largely WNL with some low and a couple MRBPs. Continue to monitor. --T2DM: Metformin 1000mg  qHS. Continue to monitor CBGs with q4hr in latent labor and q2hr in active labor. Endotool PRN recurrently elevated CBGs. --Anxiety: Escitalopram 15mg  qHS --GERD: Famotidine 40mg  BID --BMI 43  --Rh pos/GBS neg/girl/breast/Mirena OP   Thereasa Solo, MD 8:35 PM   Evaluation and management procedures were performed by the [facility] Medicine Resident under my supervision. I was immediately available for direct supervision, assistance and direction throughout this encounter.  I also confirm that I have verified the information documented in the resident's note, and that I have also personally reperformed the pertinent components of the physical exam and all of  the medical decision making activities.  I have also made any necessary editorial changes.   Mittie Bodo, MD Family Medicine - Obstetrics Fellow

## 2023-11-08 NOTE — Progress Notes (Addendum)
 Attestation of Supervision of Student:  I confirm that I have verified the information documented in the medical resident's note and that I have also personally supervised the history, physical exam and all medical decision making activities.  I have verified that all services and findings are accurately documented in this student's note; and I agree with management and plan as outlined in the documentation. I have also made any necessary editorial changes.  Richardson Landry, CNM Center for [employer], [facility] 11/08/2023 11:06 AM   Labor Progress Note Signa INFANTOF Alexandra Jones is a 28 y.o. G1P0000 at [redacted]w[redacted]d presented for IOL due to [place].  S: Patient is resting comfortably in bed. No major concerns other than some tailbone pain since she recently fell. Amenable to cervical check and foley balloon.   O:  BP 121/77   Pulse 92   Temp 98 F (36.7 C) (Oral)   Resp 18   Ht 5\' 5"  (1.651 m)   Wt 119.3 kg   LMP 02/18/2023   BMI 43.75 kg/m  EFM: baseline 150 bpm / moderate variability / + accelerations  CVE: Dilation: 2 Effacement (%): 70 Cervical Position: Anterior Station: -2 Presentation: Vertex Exam by:: Gomes DO  A&P: 28 y.o. G1P0000 [redacted]w[redacted]d IOL due to cHTN #Labor: IOL in latent phase of labor s/p dual cytotec followed by oral cytotec x2. RBA of cervical ripening balloon d/w patient with patient consenting to and desiring this intervention, speculum used to visualize cervix and Cook's cath balloon used. 60mL filled in uterine balloon early, tension applied and determined to be placed correctly. Patient tolerated procedure well. Will administer of cytotec orally, and plan AROM after expulsion of balloon. RBA of AROM discussed with patient agreeable to this when appropriate.  #Pain: Per pt request #FWB: Cat 1 #GBS negative  #cHTN: PEC w/ UPC neg  normotensive throughout admission to L&D   #BGDM: on Metformin  CBG ok thus far   #MDD/GAD: early PPD screen    #HLD   #Vit D deficiency  Fortunato Curling, DO [facility], PGY-1 9:46 AM

## 2023-11-08 NOTE — Progress Notes (Signed)
 Labor Progress Note Alexandra Jones is a 28 y.o. G1P0000 at [redacted]w[redacted]d presented for IOL for CHTN  S:  Patient comfortable. Has been walking unit and independently doing position changes.   O:  BP (!) 155/84   Pulse 95   Temp 98 F (36.7 C) (Oral)   Resp 16   Ht 5\' 5"  (1.651 m)   Wt 119.3 kg   LMP 02/18/2023   BMI 43.75 kg/m   EFM: baseline 145 bpm/ moderate variability/ 10x10 accels/ occasional late/variable decels  Toco/IUPC: 3-4  SVE: Dilation: 4 Effacement (%): 70 Cervical Position: Anterior Station: -1 Presentation: Vertex Exam by:: Jonah Blue CNM Pitocin: 4 mu/min  A/P: 28 y.o. G1P0000 [redacted]w[redacted]d IOL for CHTN. CNM to room following RN concern regarding FHR tracing and difficulty tracing contractions and therefore fetal response to contractions.   1. Labor: IOL in latent phase of labor following cervical ripening and expulsion of cooks balloon. Previous attempt AROM unsuccessful, now after several hours of pitocin, RBA of AROM and placement of IUPC d/w patient agrees to both interventions. AROM performed with copious amount of light meconium fluid noted. IUPC also placed easily and patient tolerated well. Encouraged by dilation of cervix, but primarily by change in cervix from very anterior to more centrally located with good application of fetal head. Encouraged patient to continue position changes as tolerated. Titration of pitocin for adequate MVUs and monitor for fetal well being.  2. FWB: Cat 2 3. Pain: Coping well. Epidural on request.  4. GBS negative   Anticipate NVSB.  Richardson Landry, CNM 5:09 PM

## 2023-11-08 NOTE — Anesthesia Procedure Notes (Signed)
 Epidural Patient location during procedure: OB Start time: 11/08/2023 9:45 PM End time: 11/08/2023 9:48 PM  Staffing Anesthesiologist: Kaylyn Layer, MD Performed: anesthesiologist   Preanesthetic Checklist Completed: patient identified, IV checked, risks and benefits discussed, monitors and equipment checked, pre-op evaluation and timeout performed  Epidural Patient position: sitting Prep: DuraPrep and site prepped and draped Patient monitoring: continuous pulse ox, blood pressure and heart rate Approach: midline Location: L3-L4 Injection technique: LOR air  Needle:  Needle type: Tuohy  Needle gauge: 17 G Needle length: 9 cm Needle insertion depth: 6 cm Catheter type: closed end flexible Catheter size: 19 Gauge Catheter at skin depth: 11 cm Test dose: negative and Other (1% lidocaine)  Assessment Events: blood not aspirated, no cerebrospinal fluid, injection not painful, no injection resistance, no paresthesia and negative IV test  Additional Notes Patient identified. Risks, benefits, and alternatives discussed with patient including but not limited to bleeding, infection, nerve damage, paralysis, failed block, incomplete pain control, headache, blood pressure changes, nausea, vomiting, reactions to medication, itching, and postpartum back pain. Confirmed with bedside nurse the patient's most recent platelet count. Confirmed with patient that they are not currently taking any anticoagulation, have any bleeding history, or any family history of bleeding disorders. Patient expressed understanding and wished to proceed. All questions were answered. Sterile technique was used throughout the entire procedure. Please see nursing notes for vital signs.   Crisp LOR on first pass. Test dose was given through epidural catheter and negative prior to continuing to dose epidural or start infusion. Warning signs of high block given to the patient including shortness of breath,  tingling/numbness in hands, complete motor block, or any concerning symptoms with instructions to call for help. Patient was given instructions on fall risk and not to get out of bed. All questions and concerns addressed with instructions to call with any issues or inadequate analgesia.  Reason for block:procedure for pain

## 2023-11-08 NOTE — Progress Notes (Signed)
 Labor Progress Note Alexandra Jones is a 28 y.o. G1P0000 at [redacted]w[redacted]d presented for IOL due to [place]. S: Patient is resting comfortably, seen ambulating around unit earlier. Feeling more cramps and the FB came out.  O:  BP 137/84   Pulse 86   Temp 98 F (36.7 C) (Oral)   Resp 16   Ht 5\' 5"  (1.651 m)   Wt 119.3 kg   LMP 02/18/2023   BMI 43.75 kg/m  EFM: 150 bpm / moderate variability / + accels  CVE: Dilation: 3 Effacement (%): 70 Cervical Position: Anterior Station: -3 Presentation: Vertex Exam by:: Warren-Hill CNM  A&P: 28 y.o. G1P0000 [redacted]w[redacted]d presenting for IOL d/t cHTN. #Labor: s/p dual cytotec followed by oral cytotec x2 and foley ballon, however still not making expected progress. Discussed other augmentation methods such as AROM vs Pit, which patient understood the benefits of. Pt was amenable to AROM, however d/t anterior cervix and pain, AROM wasn't successful. Will trial pit to help augment cervical change further. #Pain: Epidural prn #FWB: Cat 1 #GBS negative  #cHTN: PEC w/ UPC neg  normotensive throughout admission to L&D   #BGDM: on Metformin  CBG ok thus far   #MDD/GAD: early PPD screen   #HLD   #Vit D deficiency  Fortunato Curling, DO [facility], PGY-1 1:33 PM

## 2023-11-08 NOTE — Progress Notes (Signed)
 Labor Progress Note Alexandra Jones is a 28 y.o. G1P0000 at [redacted]w[redacted]d presented for IOL for CHTN  S:  Coping well, though more uncomfortable since AROM.   O:  BP 128/86   Pulse 86   Temp 98.3 F (36.8 C) (Oral)   Resp 16   Ht 5\' 5"  (1.651 m)   Wt 119.3 kg   LMP 02/18/2023   BMI 43.75 kg/m   EFM: baseline 150 bpm/ minimal variability/ 10x10 accels/ late decels  Toco/IUPC: 3-4, MVUs140 SVE: Dilation: 4 Effacement (%): 70 Cervical Position: Anterior Station: -1 Presentation: Vertex Exam by:: Jonah Blue CNM Pitocin: 4 mu/min  A/P: 28 y.o. G1P0000 [redacted]w[redacted]d IOL for CHTN  1. Labor: IOL in latent phase of labor s/p cytotec, FB and AROM with pitocin infusing and IUPC in place. Titration of pitocin to encourage adequate MVUs and better assess fetal response to contractions now IUPC in place. Encourage position changes.  2. FWB: Cat 2 3. Pain: Coping well. CNM at bedside to discuss pain relief options. Patient defers pain medication/epidural for now.  4. GBS negative   Anticipate NVSB.  Richardson Landry, CNM 6:38 PM

## 2023-11-08 NOTE — Progress Notes (Signed)
 Labor Progress Note Alexandra Jones is a 28 y.o. G1P0000 at [redacted]w[redacted]d presented for IOL due to [place].  S: Feeling mild cramping after Cytotec, otherwise no concerns at this time.  O:  BP 121/77   Pulse 92   Temp 98 F (36.7 C) (Oral)   Resp 18   Ht 5\' 5"  (1.651 m)   Wt 119.3 kg   LMP 02/18/2023   BMI 43.75 kg/m  EFM: 145/mod/+a/-d  CVE: Dilation: 2 Effacement (%): 60 Cervical Position: Anterior Station: -2 Presentation: Vertex Exam by:: brandy Fisher, RN   A&P: 28 y.o. G1P0000 [redacted]w[redacted]d here for IOL due to [place].  #Labor: Progressing well. Had very anterior cervix so repeat dose of Cytotec given. Will recheck in 4 hours and determine next step in induction. #Pain: Per pt request #FWB: Cat I #GBS negative  #cHTN: PEC w/up neg  normotensive throughout admission to L&D  #BGDM: on Metformin  CBG ok thus far  #MDD/GAD: early PPD screen  #HLD  #Vit D deficiency  Sundra Aland, MD 8:43 AM

## 2023-11-08 NOTE — Progress Notes (Signed)
 Lamont Snowball CNM here talking with the pt about pain control options.

## 2023-11-08 NOTE — Anesthesia Preprocedure Evaluation (Addendum)
 Anesthesia Evaluation  Patient identified by MRN, date of birth, ID band Patient awake    Reviewed: Allergy & Precautions, Patient's Chart, lab work & pertinent test results  History of Anesthesia Complications Negative for: history of anesthetic complications  Airway Mallampati: II  TM Distance: >3 FB Neck ROM: Full    Dental no notable dental hx.    Pulmonary neg pulmonary ROS   Pulmonary exam normal        Cardiovascular hypertension, Pt. on medications Normal cardiovascular exam     Neuro/Psych   Anxiety Depression       GI/Hepatic Neg liver ROS,GERD  ,,  Endo/Other  diabetes, Gestational, Oral Hypoglycemic Agents  Class 3 obesity  Renal/GU negative Renal ROS     Musculoskeletal negative musculoskeletal ROS (+)    Abdominal   Peds  Hematology negative hematology ROS (+)   Anesthesia Other Findings Day of surgery medications reviewed with patient.  Reproductive/Obstetrics (+) Pregnancy                             Anesthesia Physical Anesthesia Plan  ASA: 3  Anesthesia Plan: Epidural   Post-op Pain Management:    Induction:   PONV Risk Score and Plan: Treatment may vary due to age or medical condition  Airway Management Planned: Natural Airway  Additional Equipment: Fetal Monitoring  Intra-op Plan:   Post-operative Plan:   Informed Consent: I have reviewed the patients History and Physical, chart, labs and discussed the procedure including the risks, benefits and alternatives for the proposed anesthesia with the patient or authorized representative who has indicated his/her understanding and acceptance.       Plan Discussed with:   Anesthesia Plan Comments:        Anesthesia Quick Evaluation

## 2023-11-08 NOTE — Progress Notes (Signed)
 Labor Progress Note Alexandra Jones is a 28 y.o. G1P0000 at [redacted]w[redacted]d presented for IOL for CHTN and DM S: Coping well, mild cramping  O:  BP 133/86   Pulse 88   Temp 98 F (36.7 C) (Oral)   Resp 18   Ht 5\' 5"  (1.651 m)   Wt 119.3 kg   LMP 02/18/2023   BMI 43.75 kg/m   EFM: baseline 150, accels, no decels, moderate variability TOCO: infrequent contractions  CVE: Dilation: 2 Effacement (%): 60 Cervical Position: Anterior Station: -2 Presentation: Vertex Exam by:: brandy Fisher, RN   A&P: 28 y.o. G1P0000 [redacted]w[redacted]d admitted for IOL #Labor: Progressing well. Second dose Cytotec (50 mcg oral) #Pain: Mild #FWB: Cat I #GBS negative  #T2DM on Metformin; Type B - q4hr CBG in latent labor, q2hr in active labor, continue metformin; Endotool as needed for recurrently elevated CBG CBG (last 3)  Recent Labs    11/08/23 0128 11/08/23 0515  GLUCAP 107* 105*     #CHTN on Norvasc 10 mg  - Continue Norvasc; Plt 216, Urine pro/cr wnl, AST 17, ALT 13, Cr 0.59, K 3.8 - Mild range BP; Asymptomatic  Wyn Forster, MD 6:57 AM

## 2023-11-09 ENCOUNTER — Encounter (HOSPITAL_COMMUNITY): Payer: Self-pay | Admitting: Obstetrics & Gynecology

## 2023-11-09 DIAGNOSIS — Z3A37 37 weeks gestation of pregnancy: Secondary | ICD-10-CM

## 2023-11-09 DIAGNOSIS — O1002 Pre-existing essential hypertension complicating childbirth: Secondary | ICD-10-CM | POA: Diagnosis not present

## 2023-11-09 DIAGNOSIS — O99214 Obesity complicating childbirth: Secondary | ICD-10-CM | POA: Diagnosis not present

## 2023-11-09 DIAGNOSIS — O24424 Gestational diabetes mellitus in childbirth, insulin controlled: Secondary | ICD-10-CM | POA: Diagnosis not present

## 2023-11-09 DIAGNOSIS — O99344 Other mental disorders complicating childbirth: Secondary | ICD-10-CM

## 2023-11-09 LAB — GLUCOSE, CAPILLARY
Glucose-Capillary: 105 mg/dL — ABNORMAL HIGH (ref 70–99)
Glucose-Capillary: 113 mg/dL — ABNORMAL HIGH (ref 70–99)
Glucose-Capillary: 114 mg/dL — ABNORMAL HIGH (ref 70–99)
Glucose-Capillary: 97 mg/dL (ref 70–99)

## 2023-11-09 MED ORDER — SODIUM CHLORIDE 0.9% FLUSH
3.0000 mL | Freq: Two times a day (BID) | INTRAVENOUS | Status: DC
  Administered 2023-11-09 (×2): 3 mL via INTRAVENOUS

## 2023-11-09 MED ORDER — PRENATAL MULTIVITAMIN CH
1.0000 | ORAL_TABLET | Freq: Every day | ORAL | Status: DC
  Administered 2023-11-09 – 2023-11-10 (×2): 1 via ORAL
  Filled 2023-11-09 (×2): qty 1

## 2023-11-09 MED ORDER — FENTANYL CITRATE (PF) 100 MCG/2ML IJ SOLN
INTRAMUSCULAR | Status: DC | PRN
  Administered 2023-11-09: 100 ug via EPIDURAL

## 2023-11-09 MED ORDER — OXYCODONE HCL 5 MG PO TABS: 5.0000 mg | ORAL_TABLET | ORAL | Status: DC | PRN

## 2023-11-09 MED ORDER — COCONUT OIL OIL
1.0000 | TOPICAL_OIL | Status: DC | PRN
  Administered 2023-11-09: 1 via TOPICAL

## 2023-11-09 MED ORDER — SODIUM CHLORIDE 0.9% FLUSH: 3.0000 mL | INTRAVENOUS | Status: DC | PRN

## 2023-11-09 MED ORDER — ONDANSETRON HCL 4 MG PO TABS: 4.0000 mg | ORAL_TABLET | ORAL | Status: DC | PRN

## 2023-11-09 MED ORDER — FENTANYL CITRATE (PF) 100 MCG/2ML IJ SOLN
INTRAMUSCULAR | Status: AC
  Filled 2023-11-09: qty 2

## 2023-11-09 MED ORDER — ZOLPIDEM TARTRATE 5 MG PO TABS: 5.0000 mg | ORAL_TABLET | Freq: Every evening | ORAL | Status: DC | PRN

## 2023-11-09 MED ORDER — DIBUCAINE (PERIANAL) 1 % EX OINT: 1.0000 | TOPICAL_OINTMENT | CUTANEOUS | Status: DC | PRN

## 2023-11-09 MED ORDER — ACETAMINOPHEN 325 MG PO TABS: 650.0000 mg | ORAL_TABLET | ORAL | Status: DC | PRN

## 2023-11-09 MED ORDER — DIPHENHYDRAMINE HCL 25 MG PO CAPS: 25.0000 mg | ORAL_CAPSULE | Freq: Four times a day (QID) | ORAL | Status: DC | PRN

## 2023-11-09 MED ORDER — TETANUS-DIPHTH-ACELL PERTUSSIS 5-2.5-18.5 LF-MCG/0.5 IM SUSY: 0.5000 mL | PREFILLED_SYRINGE | Freq: Once | INTRAMUSCULAR | Status: DC

## 2023-11-09 MED ORDER — SENNOSIDES-DOCUSATE SODIUM 8.6-50 MG PO TABS
2.0000 | ORAL_TABLET | ORAL | Status: DC
  Administered 2023-11-09 – 2023-11-10 (×3): 2 via ORAL
  Filled 2023-11-09 (×2): qty 2

## 2023-11-09 MED ORDER — MEASLES, MUMPS & RUBELLA VAC IJ SOLR: 0.5000 mL | Freq: Once | INTRAMUSCULAR | Status: DC

## 2023-11-09 MED ORDER — METFORMIN HCL 500 MG PO TABS
500.0000 mg | ORAL_TABLET | Freq: Two times a day (BID) | ORAL | Status: DC
Start: 2023-11-09 — End: 2023-11-09

## 2023-11-09 MED ORDER — SODIUM CHLORIDE 0.9 % IV SOLN: 250.0000 mL | INTRAVENOUS | Status: DC | PRN

## 2023-11-09 MED ORDER — ONDANSETRON HCL 4 MG/2ML IJ SOLN: 4.0000 mg | INTRAMUSCULAR | Status: DC | PRN

## 2023-11-09 MED ORDER — SIMETHICONE 80 MG PO CHEW: 80.0000 mg | CHEWABLE_TABLET | ORAL | Status: DC | PRN

## 2023-11-09 MED ORDER — BENZOCAINE-MENTHOL 20-0.5 % EX AERO: 1.0000 | INHALATION_SPRAY | CUTANEOUS | Status: DC | PRN

## 2023-11-09 MED ORDER — FUROSEMIDE 20 MG PO TABS
20.0000 mg | ORAL_TABLET | Freq: Two times a day (BID) | ORAL | Status: DC
  Administered 2023-11-09 – 2023-11-10 (×3): 20 mg via ORAL
  Filled 2023-11-09 (×3): qty 1

## 2023-11-09 MED ORDER — WITCH HAZEL-GLYCERIN EX PADS: 1.0000 | MEDICATED_PAD | CUTANEOUS | Status: DC | PRN

## 2023-11-09 MED ORDER — BUPIVACAINE HCL (PF) 0.25 % IJ SOLN
INTRAMUSCULAR | Status: DC | PRN
  Administered 2023-11-09: 8 mL via EPIDURAL

## 2023-11-09 MED ORDER — POTASSIUM CHLORIDE CRYS ER 20 MEQ PO TBCR
20.0000 meq | EXTENDED_RELEASE_TABLET | Freq: Two times a day (BID) | ORAL | Status: DC
  Administered 2023-11-09 – 2023-11-10 (×2): 20 meq via ORAL
  Filled 2023-11-09 (×2): qty 1

## 2023-11-09 MED ORDER — IBUPROFEN 600 MG PO TABS
600.0000 mg | ORAL_TABLET | Freq: Four times a day (QID) | ORAL | Status: DC
  Administered 2023-11-09 – 2023-11-10 (×6): 600 mg via ORAL
  Filled 2023-11-09 (×6): qty 1

## 2023-11-09 NOTE — Discharge Summary (Signed)
 Postpartum Discharge Summary  Date of Service updated***     Patient Name: Alexandra Jones DOB: 1996/01/31 MRN: [id number]  Date of admission: 11/08/2023 Delivery date:11/09/2023 Delivering provider: Wyn Forster Date of discharge: 11/09/2023  Admitting diagnosis: Intrauterine pregnancy [Z34.90] Intrauterine pregnancy: [redacted]w[redacted]d     Secondary diagnosis:  Principal Problem:   Intrauterine pregnancy Active Problems:   Anxiety disorder   Class 3 severe obesity with serious comorbidity and body mass index (BMI) of 40.0 to 44.9 in adult [facility])   Pre-diabetes   Essential hypertension   Supervision of high-risk pregnancy   Gestational diabetes mellitus (GDM) controlled on oral hypoglycemic drug   NSVD (normal spontaneous vaginal delivery)  Additional problems: Pregestational diabetes and chronic hypertension    Discharge diagnosis: Term Pregnancy Delivered                                           Post partum procedures:{Postpartum procedures:23558} Augmentation: AROM, Pitocin, Cytotec, and IP Foley Complications: {OB Labor/Delivery Complications:20784}  Hospital course: Induction of Labor With Vaginal Delivery   28 y.o. yo G1P0000 at [redacted]w[redacted]d was admitted to the hospital 11/08/2023 for induction of labor.  Indication for induction: TYPE 2 DM and Chronic hypertension .  Patient had an labor course uncomplicated. Membrane Rupture Time/Date: 4:46 PM,11/08/2023  Delivery Method:Vaginal, Spontaneous Operative Delivery:N/A Episiotomy: None Lacerations:    Details of delivery can be found in separate delivery note.  Patient had a postpartum course complicated by***. Patient is discharged home 11/09/23.  Newborn Data: Birth date:11/09/2023 Birth time:10:05 AM Gender:Female Living status:Living Apgars:6 ,9  Weight:   Magnesium Sulfate received: {Mag received:30440022} BMZ received: No Rhophylac:N/A MMR:Yes T-DaP:Given prenatally Flu: Yes RSV Vaccine received:  No Transfusion:{Transfusion received:30440034}  Immunizations received: Immunization History  Administered Date(s) Administered   DTaP 12/10/1993, 02/10/1994, 04/18/1994, 01/20/1995   HIB (PRP-OMP) 12/10/1993, 02/10/1994, 04/18/1994, 01/20/1995   HPV Quadrivalent 03/06/2011, 05/08/2011, 09/15/2011   Hepatitis B, PED/ADOLESCENT 12/10/1993, 02/10/1994, 07/18/1994   IPV 12/10/1993, 02/10/1994, 04/18/1994   Influenza Inj Mdck Quad Pf 05/31/2019   Influenza,inj,Quad PF,6+ Mos 06/03/2018, 05/21/2021, 05/13/2022   Influenza-Unspecified 06/10/2023   MMR 01/20/1995, 02/13/2012   Meningococcal B Recombinant 11/10/2011   Meningococcal Conjugate 11/10/2011   Moderna SARS-COV2 Booster Vaccination 07/12/2020   Moderna Sars-Covid-2 Vaccination 11/10/2019, 12/14/2019   PPD Test 03/08/2018, 03/24/2018, 03/16/2019, 03/26/2020, 05/21/2021   Tdap 09/27/2010, 09/14/2023   Varicella 01/09/1995, 03/08/2018    Physical exam  Vitals:   11/09/23 0900 11/09/23 0930 11/09/23 1015 11/09/23 1030  BP: 128/77 137/74 126/72 (!) 137/21  Pulse: 87 94 90   Resp: 16 16 16 15   Temp:  98.8 F (37.1 C)  98.8 F (37.1 C)  TempSrc:  Oral  Oral  SpO2:      Weight:      Height:       General: {Exam; general:21111117} Lochia: {Desc; appropriate/inappropriate:30686::"appropriate"} Uterine Fundus: {Desc; firm/soft:30687} Incision: {Exam; incision:21111123} DVT Evaluation: {Exam; dvt:2111122} Labs: Lab Results  Component Value Date   WBC 10.7 (H) 11/08/2023   HGB 12.4 11/08/2023   HCT 36.5 11/08/2023   MCV 84.7 11/08/2023   PLT 213 11/08/2023      Latest Ref Rng & Units 11/08/2023   12:13 AM  CMP  Glucose 70 - 99 mg/dL 578   BUN 6 - 20 mg/dL 11   Creatinine 4.69 - 1.00 mg/dL 6.29   Sodium 528 - 413 mmol/L 137  Potassium 3.5 - 5.1 mmol/L 3.8   Chloride 98 - 111 mmol/L 108   CO2 22 - 32 mmol/L 16   Calcium 8.9 - 10.3 mg/dL 8.8   Total Protein 6.5 - 8.1 g/dL 6.2   Total Bilirubin 0.0 - 1.2 mg/dL 0.3    Alkaline Phos 38 - 126 U/L 161   AST 15 - 41 U/L 17   ALT 0 - 44 U/L 13    Edinburgh Score:     No data to display         No data recorded  After visit meds:  Allergies as of 11/09/2023       Reactions   Hydrocodone Hives   Penicillins Rash   fever Has patient had a PCN reaction causing immediate rash, facial/tongue/throat swelling, SOB or lightheadedness with hypotension: Yes Has patient had a PCN reaction causing severe rash involving mucus membranes or skin necrosis: No Has patient had a PCN reaction that required hospitalization: No Has patient had a PCN reaction occurring within the last 10 years: Yes If all of the above answers are "NO", then may proceed with Cephalosporin use.     Med Rec must be completed prior to using this [facility]***        Discharge home in stable condition Infant Feeding: Breast Infant Disposition:home with mother Discharge instruction: per After Visit Summary and Postpartum booklet. Activity: Advance as tolerated. Pelvic rest for 6 weeks.  Diet: routine diet Future Appointments:No future appointments. Follow up Visit:  Follow-up Information     [facility] for [facility] at [facility]. Schedule an appointment as soon as possible for a visit in 1 week(s).   Specialty: Obstetrics and Gynecology Why: Postpartum blood pressure follow up in 1 week Contact information: [address] [place] [address] [phone]        [facility] for [facility] at [facility]. Schedule an appointment as soon as possible for a visit.   Specialty: Obstetrics and Gynecology Why: Routine postpartum follow up in 4-6 weeks Contact information: [address] [place] [address] [phone]               Message sen to FT 3/31  Please schedule this patient for a In person postpartum visit in 4 weeks with the following provider: Any provider. Additional  Postpartum F/U:Postpartum Depression checkup, 2 hour GTT, and BP check 1 week  High risk pregnancy complicated by: GDM and HTN Delivery mode:  Vaginal, Spontaneous Anticipated Birth Control:  IUD Mirena at postpartum visit   11/09/2023 Wyn Forster, MD

## 2023-11-09 NOTE — Progress Notes (Addendum)
 Labor Progress Note Alexandra Jones is a 28 y.o. G1P0000 at 101w5d who presented for IOL for cHTN.  S: More comfortable with epidural in place. Still anxious.  O:  BP 135/75   Pulse 82   Temp 98.2 F (36.8 C) (Oral)   Resp 16   Ht 5\' 5"  (1.651 m)   Wt 119.3 kg   LMP 02/18/2023   SpO2 97%   BMI 43.75 kg/m  EFM: 150bpm baseline, mild variability, + accels, no decels CTX: q2-22min on IUPC  CVE: Dilation: 5 Effacement (%): 80 Cervical Position: Middle Station: -1 Presentation: Vertex Exam by:: Dr Judd Lien   A&P:   Alexandra Jones is a 28 y.o. G1P0000 at [redacted]w[redacted]d who presented for IOL for cHTN.   --IOL: Most recent SVE 5/80/-1 at 16:45. Plan to continue pitocin titration (currently at 80mL/hr).         --S/p cytotec         --AROM with light meconium at 16:45         --IUPC in place. Continue to titrate pitocin to achieve adequate MVUs.         --Epidural in place --FWB: Category II due to mild variability, but overall remains reassuring. Continue to monitor. EFW 80% with AC 94% on 03/17 [place]. --cHTN: Asymptomatic, BP labs WNL. Amlodipine 10mg  qHS. BP largely WNL with some low and a couple MRBPs. Continue to monitor. --T2DM: Metformin 1000mg  qHS. Continue to monitor CBGs with q4hr in latent labor and q2hr in active labor. Endotool PRN recurrently elevated CBGs. --Anxiety: Escitalopram 15mg  qHS --GERD: Famotidine 40mg  BID --BMI 43   --Rh pos/GBS neg/girl/breast/Mirena OP   Thereasa Solo, MD 2:45 AM   Evaluation and management procedures were performed by the [facility] Medicine Resident under my supervision. I was immediately available for direct supervision, assistance and direction throughout this encounter.  I also confirm that I have verified the information documented in the resident's note, and that I have also personally reperformed the pertinent components of the physical exam and all of the medical decision making activities.  I have also made any necessary editorial  changes.   Mittie Bodo, MD Family Medicine - Obstetrics Fellow

## 2023-11-09 NOTE — Lactation Note (Signed)
 This note was copied from a baby's chart. Lactation Consultation Note  Patient Name: Alexandra Jones [id number] Date: 11/09/2023 Age:30 hours Reason for consult: Follow-up assessment;Mother's request;Primapara;Difficult latch;Early term 37-38.6wks;Maternal endocrine disorder Mom holding baby STS when LC came into rm. Mom stated that when she latches baby she will get on for a few seconds then pop off screaming. Mom sitting in recliner. Got mom to go to the bed. Done some hand expression and spoon fed easily expressed colostrum to stimulate baby to feed. She took colostrum well from spoon. In football position got baby to latch. Needing to aimed nipple more towards roof of mouth verses laying nipple on tongue. Work w/mom on latching, holding breast tissue for latching, and baby, body alignment, support and props. D/t mom being GDM and baby having low glucose earlier and near low last lab, suggested mom BF 15-20 minutes no longer to keep baby from using to much energy BF for the first 24 hrs to make sure baby's glucose has stabled. Also suggested to supplement w/every feeding to be able to visibly see baby got something. Lucky baby was swallowing on the breast. Mom has great supply of colostrum and baby was transferring w/audible swallows. Praised mom. Giving mom shells to use d/t nipples are flat but very compressible at this time. Mom had DEBP at bedside and has used it but can get a lot of colostrum w/hand expression.  Asked mom to call LC tonight if needs assistance.   Maternal Data Has patient been taught Hand Expression?: Yes  Feeding    LATCH Score Latch: Grasps breast easily, tongue down, lips flanged, rhythmical sucking.  Audible Swallowing: Spontaneous and intermittent  Type of Nipple: Flat (very compressible)  Comfort (Breast/Nipple): Soft / non-tender  Hold (Positioning): Assistance needed to correctly position infant at breast and maintain latch.  LATCH Score:  8   Lactation Tools Discussed/Used Tools: Shells  Interventions Interventions: Breast feeding basics reviewed;Assisted with latch;Skin to skin;Breast massage;Hand express;Pre-pump if needed;Breast compression;Adjust position;Support pillows;Position options;Expressed milk;Shells;Education  Discharge    Consult Status Consult Status: Follow-up Date: 11/10/23 Follow-up type: In-patient    Charyl Dancer 11/09/2023, 11:19 PM

## 2023-11-09 NOTE — Lactation Note (Signed)
 This note was copied from a baby's chart. Lactation Consultation Note  Patient Name: Alexandra Jones [id number] Date: 11/09/2023 Age:30 hours  Reason for consult: Initial assessment;Primapara;1st time breastfeeding;Early term 37-38.6wks;Breastfeeding assistance;Maternal endocrine disorder  P1, [redacted]w[redacted]d, GDM  Initial visit to see mother and baby Alexandra "Alexandra Jones". Mother states baby has breast fed twice since birth. Infant's first serum glucose,due to GDM, was 38. Upon arrival to room, baby was skin to skin with mother. Mother was receptive to learn hand expression and 4 ml was expressed and fed to baby by spoon. Baby showed feeding cues and baby placed to breast in football hold.   Baby latched well and fed for 10 minutes.The first 5-7 min were nutritive with audible swallows. Mother was taught breast compression to increase milk flow.   Discussed early term infant feeding behavior. Educated to latch baby first then pump to stimulate milk production. Parents can spoon feed baby expressed milk and call RN for additional assist and options.   DEBP set up with mother's consent. Instructed mother on the use, frequency, cleaning of breast pump and storage of breast milk. Mother was comfortable with the 21 mm flange on the left breast and 18 mm flange on the right. Discussed switching the flange to the size that gives good suction without pain. Mother pumped for a few minutes before visitor arrived. Right nipple appeared irritated and small bleeding noted. Advised to use coconut oil and use a low setting when pumping. Call if bleeding continues.   Mom made aware of O/P services, breastfeeding support groups, and our phone # for post-discharge questions.     Maternal Data Has patient been taught Hand Expression?: Yes Does the patient have breastfeeding experience prior to this delivery?: No  Feeding Mother's Current Feeding Choice: Breast Milk  LATCH Score Latch: Grasps breast easily, tongue down, lips  flanged, rhythmical sucking.  Audible Swallowing: A few with stimulation  Type of Nipple: Everted at rest and after stimulation  Comfort (Breast/Nipple): Soft / non-tender  Hold (Positioning): Assistance needed to correctly position infant at breast and maintain latch.  LATCH Score: 8   Lactation Tools Discussed/Used Tools: Pump;Flanges;Coconut oil Flange Size: 21;18 Breast pump type: Double-Electric Breast Pump Pump Education: Setup, frequency, and cleaning;Milk Storage Reason for Pumping: ETI, GDM, low blood sugar (38) Pumping frequency: every 3 hours for 15 min in the initation setting/ may pump 3-4 in 24 hrs if baby is actively feeding  Interventions Interventions: Breast feeding basics reviewed;Assisted with latch;Skin to skin;Breast compression;Support pillows;Position options;DEBP;Hand pump;Coconut oil;Education;LC Services brochure;CDC milk storage guidelines;CDC Guidelines for Breast Pump Cleaning  Discharge Pump: DEBP;Personal  Consult Status Consult Status: Follow-up Date: 11/10/23 Follow-up type: In-patient    Christella Hartigan M 11/09/2023, 3:09 PM

## 2023-11-10 ENCOUNTER — Other Ambulatory Visit: Payer: Commercial Managed Care - PPO

## 2023-11-10 ENCOUNTER — Other Ambulatory Visit (HOSPITAL_COMMUNITY): Payer: Self-pay

## 2023-11-10 LAB — CBC
HCT: 32.6 % — ABNORMAL LOW (ref 36.0–46.0)
Hemoglobin: 10.8 g/dL — ABNORMAL LOW (ref 12.0–15.0)
MCH: 28.8 pg (ref 26.0–34.0)
MCHC: 33.1 g/dL (ref 30.0–36.0)
MCV: 86.9 fL (ref 80.0–100.0)
Platelets: 181 10*3/uL (ref 150–400)
RBC: 3.75 MIL/uL — ABNORMAL LOW (ref 3.87–5.11)
RDW: 14.6 % (ref 11.5–15.5)
WBC: 12 10*3/uL — ABNORMAL HIGH (ref 4.0–10.5)
nRBC: 0 % (ref 0.0–0.2)

## 2023-11-10 LAB — GLUCOSE, CAPILLARY: Glucose-Capillary: 79 mg/dL (ref 70–99)

## 2023-11-10 MED ORDER — AMLODIPINE BESYLATE 5 MG PO TABS
10.0000 mg | ORAL_TABLET | Freq: Every day | ORAL | Status: DC
Start: 2023-11-10 — End: 2023-11-10
  Filled 2023-11-10: qty 2

## 2023-11-10 MED ORDER — IBUPROFEN 600 MG PO TABS
600.0000 mg | ORAL_TABLET | Freq: Four times a day (QID) | ORAL | 0 refills | Status: DC
  Filled 2023-11-10: qty 30, 8d supply, fill #0

## 2023-11-10 MED ORDER — AMLODIPINE BESYLATE 10 MG PO TABS
10.0000 mg | ORAL_TABLET | Freq: Every day | ORAL | 2 refills | Status: DC
  Filled 2023-11-10: qty 90, 90d supply, fill #0

## 2023-11-10 MED ORDER — AMLODIPINE BESYLATE 5 MG PO TABS
5.0000 mg | ORAL_TABLET | Freq: Every day | ORAL | Status: DC
  Filled 2023-11-10: qty 1

## 2023-11-10 NOTE — Progress Notes (Signed)
 Patient to call RN when breakfast arrives to check blood sugar.

## 2023-11-10 NOTE — Progress Notes (Signed)
 MOB was referred for history of depression/anxiety.  * Referral screened out by Clinical Social Worker because none of the following criteria appear to apply:  ~ History of anxiety/depression during this pregnancy, or of post-partum depression following prior delivery.  ~ Diagnosis of anxiety and/or depression within last 3 years  OR  * MOB's symptoms currently being treated with medication and/or therapy.  Per OB notes, MOB has been prescribed Lexapro 15 mg for support.  Please contact the Clinical Social Worker if needs arise, by [facility] request, or if MOB scores greater than 9/yes to question 10 on Edinburgh Postpartum Depression Screen.  Enos Fling, Theresia Majors Clinical Social Worker [phone]

## 2023-11-10 NOTE — Lactation Note (Signed)
 This note was copied from a baby's chart. Lactation Consultation Note  Patient Name: Alexandra Jones [id number] Date: 11/10/2023 Age:30 hours, P1  Reason for consult: Follow-up assessment;Primapara;1st time breastfeeding;Early term 37-38.6wks;Infant weight loss;Maternal endocrine disorder;Breastfeeding assistance;RN request LC offered to assess and assist with latch, mom receptive.  Dad checked the diaper and it was dry. LC placed the baby STS on the right breast and worked with  mom to obtain the depth. The feeding was on and off at 1st and then after several attempts the baby sustained the latch longer with swallows and per mom comfortable. After baby fed on the right switched to the left and mom latched with depth and baby sustained a latch for 10 mins. Swallows increased with breast compressions.  LC reviewed breast feeding basics and stressed the importance of consistently obtained a deep latch and baby feeding at least 10 mins or greater to count as a good feeding.  LC explained to parents its a lot easier when the milk has come in.  LC recommended to enhance the milk coming in to post pump after 4 feedings a day until the milk comes or take the hand pump and pre- pump to prime the milk ducts prior to latch.  LC reviewed steps for latching and the importance of the areola to be compressible prior to latch.  If the baby is still hungry after the 1st breast offer the 2nd breast.  LC reviewed engorgement prevention and tx.  Dyad has a LC O/P scheduled - see progress note from the LC's O/P.    Maternal Data Has patient been taught Hand Expression?: Yes (increase flow after latch)  Feeding Mother's Current Feeding Choice: Breast Milk  LATCH Score Latch: Repeated attempts needed to sustain latch, nipple held in mouth throughout feeding, stimulation needed to elicit sucking reflex.  Audible Swallowing: A few with stimulation (swallows increased with breast compressions and once baby stayed  latched)  Type of Nipple: Everted at rest and after stimulation  Comfort (Breast/Nipple): Soft / non-tender  Hold (Positioning): Assistance needed to correctly position infant at breast and maintain latch.  LATCH Score: 7 2nd latch was score 8   Lactation Tools Discussed/Used Tools: Pump;Shells;Flanges Flange Size: 18;21 Breast pump type: Double-Electric Breast Pump;Manual Pump Education: Setup, frequency, and cleaning;Milk Storage Reason for Pumping: to enhance flow Pumped volume: 5 mL  Interventions Interventions: Breast feeding basics reviewed;Assisted with latch;Skin to skin;Breast massage;Hand express;Pre-pump if needed;Reverse pressure;Breast compression;Adjust position;Support pillows;Position options;Shells;Hand pump;DEBP;Education;LC Services brochure;CDC milk storage guidelines;CDC Guidelines for Breast Pump Cleaning  Discharge Discharge Education: Engorgement and breast care;Warning signs for feeding baby;Outpatient recommendation;Other (comment) (LC"s from the O/P was into see mom and she has a F/U on 4/16 - see progress note) Pump: Hands Free;Personal;Manual  Consult Status Consult Status: Complete Date: 11/10/23    Kathrin Greathouse 11/10/2023, 11:31 AM

## 2023-11-10 NOTE — Patient Instructions (Signed)
 Your appointment with Outpatient Lactation is: April 16 th at 11:30 am [facility] (First Floor) [address] [place]  Check in under baby's name.  Please bring your baby hungry along with your pump and a bottle of either formula or expressed breast milk. Please also bring your pump flanges and we welcome support people! If you need lactation assistance before your appointment, please call [phone] and press [phone] for lactation.

## 2023-11-10 NOTE — Anesthesia Postprocedure Evaluation (Signed)
 Anesthesia Post Note  Patient: Alexandra Jones  Procedure(s) Performed: AN AD HOC LABOR EPIDURAL     Patient location during evaluation: Mother Baby Anesthesia Type: Epidural Level of consciousness: awake Pain management: satisfactory to patient Vital Signs Assessment: post-procedure vital signs reviewed and stable Respiratory status: spontaneous breathing Cardiovascular status: stable Anesthetic complications: no  No notable events documented.  Last Vitals:  Vitals:   11/10/23 0135 11/10/23 0523  BP: 121/68 133/86  Pulse: 87 84  Resp: 18 18  Temp: 36.6 C 36.7 C  SpO2: 98% 98%    Last Pain:  Vitals:   11/10/23 0523  TempSrc: Oral  PainSc: 0-No pain   Pain Goal:                   [employer]

## 2023-11-10 NOTE — Lactation Note (Signed)
 This note was copied from a baby's chart. Lactation Consultation Note  Patient Name: Alexandra Jones [id number] Date: 11/10/2023 Age:30 hours Reason for consult: Follow-up assessment;Primapara;Early term 37-38.6wks;Maternal endocrine disorder RN notified LC that mom had baby latched. LC went to see latch and baby was BF well. Baby had good body alignment and BF well. LC showed mom how to use hand pump attachment on DEBP and shells. Praised mom for good feeding. Mom excited.   Maternal Data    Feeding    LATCH Score Latch: Grasps breast easily, tongue down, lips flanged, rhythmical sucking.  Audible Swallowing: A few with stimulation  Type of Nipple: Everted at rest and after stimulation  Comfort (Breast/Nipple): Soft / non-tender  Hold (Positioning): No assistance needed to correctly position infant at breast.  LATCH Score: 9   Lactation Tools Discussed/Used Tools: Shells  Interventions Interventions: Breast feeding basics reviewed  Discharge    Consult Status Consult Status: Follow-up Date: 11/10/23 (afternoon) Follow-up type: In-patient    Charyl Dancer 11/10/2023, 5:35 AM

## 2023-11-13 ENCOUNTER — Encounter: Payer: Commercial Managed Care - PPO | Admitting: Obstetrics & Gynecology

## 2023-11-13 ENCOUNTER — Other Ambulatory Visit: Payer: Commercial Managed Care - PPO

## 2023-11-15 ENCOUNTER — Ambulatory Visit (HOSPITAL_COMMUNITY): Payer: Self-pay

## 2023-11-15 NOTE — Lactation Note (Signed)
 This note was copied from a baby's chart. Lactation Consultation Note  Patient Name: Alexandra Jones Today's Date: 11/15/2023 Age:30 days Reason for consult: Initial assessment;MD order;Primapara;1st time breastfeeding;Early term 37-38.6wks;Maternal endocrine disorder;Hyperbilirubinemia;Breastfeeding assistance (infant spitting up after every feeding)  P1- LC was unable to assess a latch because MOB had just finished bottle feeding infant her EBM a few minutes prior. LC encouraged MOB to call for a feeding so LC could assess a latch to ensure everything looks good. MOB agreed. During this consult, infant had spit up undigested milk twice. Infant did not seem like she was in distress, but she would arch her back every time right before the emesis would come out. Infant was calm and not irritable. MOB reported that infant has spit up after every feeding (both on the breast and formula) since birth. MOB reported that she uses a slow flow nipple at home, but this still happens. MOB has been using a yellow hospital throw away nipple while in the Peds unit. LC provided her with a white Nfant nipple. RN notified of MOB's complaint. LC reviewed how to pace feed infant in a side lying pace feed position to help reduce the flow of the EBM.   MOB has a hands free pump and as of today, she has been pumping 3-4 ounces of milk every pumping session. MOB reports that over the past few days, her milk supply has continued to increase by an ounce a day. LC praised MOB for her supply. MOB did bring her pump with her, but reports that she forgot her charger at home. LC set up MOB with the hospital DEBP with size 21 mm flanges. MOB does not have the inserts for her hands free pump, so LC provided her with details on where to purchase flange inserts. LC reviewed CDC milk storage guidelines, LC services handout and engorgement/breast care. LC encouraged MOB to call for further assistance as needed.  Maternal Data Has  patient been taught Hand Expression?: Yes Does the patient have breastfeeding experience prior to this delivery?: No  Feeding Mother's Current Feeding Choice: Breast Milk and Formula Nipple Type: Slow - flow  Lactation Tools Discussed/Used Tools: Pump;Flanges Flange Size: 21 Breast pump type: Double-Electric Breast Pump;Manual Pump Education: Setup, frequency, and cleaning;Milk Storage Reason for Pumping: hyperbilirubinemia Pumping frequency: 15-20 minutes every 3 hrs Pumped volume: 4 mL (ounces)  Interventions Interventions: Breast feeding basics reviewed;Hand pump;DEBP;Education;Pace feeding;LC Services brochure  Discharge Discharge Education: Engorgement and breast care;Warning signs for feeding baby;Outpatient recommendation (already has an outptaient appt set up with [facility] (per MOB)) Pump: Hands Free;Personal  Consult Status Consult Status: Follow-up Date: 11/16/23 Follow-up type: In-patient    Dema Severin BS, IBCLC 11/15/2023, 5:56 PM

## 2023-11-16 ENCOUNTER — Ambulatory Visit

## 2023-11-16 ENCOUNTER — Telehealth: Admitting: *Deleted

## 2023-11-16 ENCOUNTER — Encounter: Payer: Self-pay | Admitting: Obstetrics & Gynecology

## 2023-11-16 VITALS — BP 123/83 | HR 79

## 2023-11-16 DIAGNOSIS — I10 Essential (primary) hypertension: Secondary | ICD-10-CM

## 2023-11-16 NOTE — Progress Notes (Signed)
   NURSE VISIT- BLOOD PRESSURE CHECK  I connected with Alexandra Jones on 11/16/2023 by MyChart video and verified that I am speaking with the correct person using two identifiers.   I discussed the limitations of evaluation and management by telemedicine. The patient expressed understanding and agreed to proceed.  Nurse is at the office, and patient is at home.  SUBJECTIVE:  Alexandra Jones is a 28 y.o. G35P1001 female here for BP check. She is postpartum, delivery date 11/09/23     HYPERTENSION ROS:  Postpartum:  Severe headaches that don't go away with tylenol/other medicines: No  Visual changes (seeing spots/double/blurred vision) No  Severe pain under right breast breast or in center of upper chest No  Severe nausea/vomiting No  Taking medicines as instructed yes   OBJECTIVE:  BP 123/83 (BP Location: Left Arm, Patient Position: Sitting)   Pulse 79   LMP 02/18/2023   Breastfeeding Yes   Appearance alert, well appearing, and in no distress.  ASSESSMENT: Postpartum  blood pressure check  PLAN: Discussed with Philipp Deputy, CNM   Recommendations: no changes needed   Follow-up: as scheduled   Jobe Marker  11/16/2023 3:25 PM

## 2023-11-17 ENCOUNTER — Other Ambulatory Visit: Payer: Commercial Managed Care - PPO

## 2023-11-23 ENCOUNTER — Other Ambulatory Visit: Payer: Commercial Managed Care - PPO | Admitting: Radiology

## 2023-12-15 ENCOUNTER — Other Ambulatory Visit (HOSPITAL_COMMUNITY): Payer: Self-pay

## 2023-12-15 ENCOUNTER — Other Ambulatory Visit: Payer: Self-pay

## 2023-12-15 ENCOUNTER — Other Ambulatory Visit

## 2023-12-15 ENCOUNTER — Ambulatory Visit: Admitting: Women's Health

## 2023-12-15 ENCOUNTER — Encounter: Payer: Self-pay | Admitting: Women's Health

## 2023-12-15 ENCOUNTER — Encounter (HOSPITAL_COMMUNITY): Payer: Self-pay

## 2023-12-15 DIAGNOSIS — F53 Postpartum depression: Secondary | ICD-10-CM

## 2023-12-15 DIAGNOSIS — Z30430 Encounter for insertion of intrauterine contraceptive device: Secondary | ICD-10-CM

## 2023-12-15 DIAGNOSIS — F418 Other specified anxiety disorders: Secondary | ICD-10-CM | POA: Insufficient documentation

## 2023-12-15 DIAGNOSIS — I10 Essential (primary) hypertension: Secondary | ICD-10-CM | POA: Diagnosis not present

## 2023-12-15 DIAGNOSIS — N390 Urinary tract infection, site not specified: Secondary | ICD-10-CM | POA: Diagnosis not present

## 2023-12-15 DIAGNOSIS — Z131 Encounter for screening for diabetes mellitus: Secondary | ICD-10-CM

## 2023-12-15 DIAGNOSIS — Z8632 Personal history of gestational diabetes: Secondary | ICD-10-CM

## 2023-12-15 DIAGNOSIS — Z3202 Encounter for pregnancy test, result negative: Secondary | ICD-10-CM

## 2023-12-15 LAB — POCT URINALYSIS DIPSTICK
Blood, UA: NEGATIVE
Glucose, UA: NEGATIVE
Ketones, UA: NEGATIVE
Nitrite, UA: POSITIVE
Protein, UA: POSITIVE — AB

## 2023-12-15 LAB — POCT URINE PREGNANCY: Preg Test, Ur: NEGATIVE

## 2023-12-15 MED ORDER — LEVONORGESTREL 20 MCG/DAY IU IUD
1.0000 | INTRAUTERINE_SYSTEM | Freq: Once | INTRAUTERINE | Status: AC
  Administered 2023-12-15: 1 via INTRAUTERINE

## 2023-12-15 MED ORDER — NITROFURANTOIN MONOHYD MACRO 100 MG PO CAPS
100.0000 mg | ORAL_CAPSULE | Freq: Two times a day (BID) | ORAL | 0 refills | Status: DC
  Filled 2023-12-15 – 2023-12-18 (×3): qty 14, 7d supply, fill #0

## 2023-12-15 NOTE — Patient Instructions (Addendum)
 Nothing in vagina for 3 days (no sex, douching, tampons, etc...) Check your strings once a month to make sure you can feel them, if you are not able to please let us  know If you develop a fever of 100.4 or more in the next few weeks, or if you develop severe abdominal pain, please let us  know Use a backup method of birth control, such as condoms, for 2 weeks   Tips To Increase Milk Supply Lots of water! Enough so that your urine is clear Plenty of calories, if you're not getting enough calories, your milk supply can decrease Breastfeed/pump often, every 2-3 hours x 20-30mins Fenugreek 3 pills 3 times a day, this may make your urine smell like maple syrup Mother's Milk Tea Lactation cookies, google for the recipe Real oatmeal Body Armor sports drinks Liquid Gold Greater Than hydration drink   Levonorgestrel Intrauterine Device (IUD) What is this medication? LEVONORGESTREL (LEE voe nor jes trel) prevents ovulation and pregnancy. It may also be used to treat heavy periods. It belongs to a group of medications called contraceptives. This medication is a progestin hormone. This medicine may be used for other purposes; ask your health care provider or pharmacist if you have questions. COMMON BRAND NAME(S): Kyleena, LILETTA, Mirena, Skyla What should I tell my care team before I take this medication? They need to know if you have any of these conditions: Abnormal Pap smear Cancer of the breast, uterus, or cervix Diabetes Endometritis Genital or pelvic infection now or in the past Have more than one sexual partner or your partner has more than one partner Heart disease History of an ectopic or tubal pregnancy Immune system problems IUD in place Liver disease or tumor Problems with blood clots or take blood-thinners Seizures Use intravenous drugs Uterus of unusual shape Vaginal bleeding that has not been explained An unusual or allergic reaction to levonorgestrel, other hormones,  silicone, or polyethylene, medications, foods, dyes, or preservatives Pregnant or trying to get pregnant Breast-feeding How should I use this medication? This device is placed inside the uterus by your care team. A patient package insert for the product will be given each time it is inserted. Be sure to read this information carefully each time. The sheet may change often. Talk to your care team about use of this medication in children. Special care may be needed. Overdosage: If you think you have taken too much of this medicine contact a poison control center or emergency room at once. NOTE: This medicine is only for you. Do not share this medicine with others. What if I miss a dose? This does not apply. Depending on the brand of device you have inserted, the device will need to be replaced every 3 to 8 years if you wish to continue using this type of birth control. What may interact with this medication? Interactions are not expected. Tell your care team about all the medications you take. This list may not describe all possible interactions. Give your health care provider a list of all the medicines, herbs, non-prescription drugs, or dietary supplements you use. Also tell them if you smoke, drink alcohol, or use illegal drugs. Some items may interact with your medicine. What should I watch for while using this medication? Visit your care team for regular check-ups. Tell your care team if you or your partner becomes HIV positive or gets a sexually transmitted disease. Using this medication does not protect you or your partner against HIV or other sexually transmitted infections (STIs).  You can check the placement of the IUD yourself by reaching up to the top of your vagina with clean fingers to feel the threads. Do not pull on the threads. It is a good habit to check placement after each menstrual period. Call your care team right away if you feel more of the IUD than just the threads or if you  cannot feel the threads at all. The IUD may come out by itself. You may become pregnant if the device comes out. If you notice that the IUD has come out use a backup birth control method like condoms and call your care team. Using tampons will not change the position of the IUD and are okay to use during your period. This IUD can be safely scanned with magnetic resonance imaging (MRI) only under specific conditions. Before you have an MRI, tell your care team that you have an IUD in place, and which type of IUD you have in place. What side effects may I notice from receiving this medication? Side effects that you should report to your care team as soon as possible: Allergic reactions--skin rash, itching, hives, swelling of the face, lips, tongue, or throat Blood clot--pain, swelling, or warmth in the leg, shortness of breath, chest pain Gallbladder problems--severe stomach pain, nausea, vomiting, fever Increase in blood pressure Liver injury--right upper belly pain, loss of appetite, nausea, light-colored stool, dark yellow or brown urine, yellowing skin or eyes, unusual weakness or fatigue New or worsening migraines or headaches Pelvic inflammatory disease (PID)--fever, abdominal pain, pelvic pain, pain or trouble passing urine, spotting, bleeding during or after sex Stroke--sudden numbness or weakness of the face, arm, or leg, trouble speaking, confusion, trouble walking, loss of balance or coordination, dizziness, severe headache, change in vision Unusual vaginal discharge, itching, or odor Vaginal pain, irritation, or sores Worsening mood, feelings of depression Side effects that usually do not require medical attention (report to your care team if they continue or are bothersome): Breast pain or tenderness Dark patches of skin on the face or other sun-exposed areas Irregular menstrual cycles or spotting Nausea Weight gain This list may not describe all possible side effects. Call your  doctor for medical advice about side effects. You may report side effects to FDA at 1-800-FDA-1088. Where should I keep my medication? This does not apply. NOTE: This sheet is a summary. It may not cover all possible information. If you have questions about this medicine, talk to your doctor, pharmacist, or health care provider.  2024 Elsevier/Gold Standard (2021-04-10 00:00:00)

## 2023-12-15 NOTE — Progress Notes (Signed)
 POSTPARTUM VISIT Patient name: Alexandra Jones MRN [id number]  Date of birth: 1995/12/14 Chief Complaint:   Postpartum Care  History of Present Illness:   Alexandra Jones is a 28 y.o. G7P1001 Caucasian female being seen today for a postpartum visit. She is 5 weeks postpartum following a spontaneous vaginal delivery at 37.5 gestational weeks. IOL: yes, for chronic hypertension  and T2DM.  Anesthesia: epidural.  Laceration: 2nd degree.  Complications: none. Inpatient contraception: no.   Pregnancy complicated by [facility] and prediabetes/T2DM . Tobacco use: no. Substance use disorder: no. Last pap smear: 01/15/22 and results were NILM w/ HRHPV negative. Next pap smear due: 2026 Patient's last menstrual period was 02/18/2023.  Postpartum course has been uncomplicated. Bleeding none. Bowel function is normal. Bladder function is  cloudy urine w/ odor since delivery, some burning that has resolved . Urinary incontinence? no, fecal incontinence? no Patient is not sexually active. Last sexual activity: prior to birth of baby. Desired contraception: IUD. Patient does want a pregnancy in the future.  Desired family size is 2 children.   The pregnancy intention screening data noted above was reviewed. Potential methods of contraception were discussed. The patient elected to proceed with No data recorded.  Edinburgh Postpartum Depression Screening: positive, has dep/anx, on lexapro  15mg  daily, interested in [facility]. Denies SI/HI/II. Good days/bad days about the same. Does not want to increase dosage right now.   Edinburgh Postnatal Depression Scale - 12/15/23 0846       Edinburgh Postnatal Depression Scale:  In the Past 7 Days   I have been able to laugh and see the funny side of things. 0    I have looked forward with enjoyment to things. 1    I have blamed myself unnecessarily when things went wrong. 2    I have been anxious or worried for no good reason. 3    I have felt scared or panicky for no good reason.  2    Things have been getting on top of me. 2    I have been so unhappy that I have had difficulty sleeping. 0    I have felt sad or miserable. 1    I have been so unhappy that I have been crying. 1    The thought of harming myself has occurred to me. 0    Edinburgh Postnatal Depression Scale Total 12                05/20/2023   10:36 AM 05/15/2023    3:44 PM 10/07/2022   11:07 AM 02/04/2022    8:36 AM  GAD 7 : Generalized Anxiety Score  Nervous, Anxious, on Edge 2 2 1 2   Control/stop worrying 1 2 2 2   Worry too much - different things 2 2 2 2   Trouble relaxing 0 1 1 1   Restless 0 0 1 1  Easily annoyed or irritable 1 2 0 1  Afraid - awful might happen 1 1 2 2   Total GAD 7 Score 7 10 9 11   Anxiety Difficulty  Somewhat difficult Somewhat difficult      Baby's course has been complicated by reflux . Baby is feeding by breast and bottle: milk supply inadequate . Infant has a pediatrician/family doctor? Yes.  Childcare strategy if returning to work/school: family.  Pt has material needs met for her and baby: Yes.   Review of Systems:   Pertinent items are noted in HPI Denies Abnormal vaginal discharge w/ itching/odor/irritation, headaches, visual changes, shortness  of breath, chest pain, abdominal pain, severe nausea/vomiting, or problems with urination or bowel movements. Pertinent History Reviewed:  Reviewed past medical,surgical, obstetrical and family history.  Reviewed problem list, medications and allergies. OB History  Gravida Para Term Preterm AB Living  1 1 1  0 0 1  SAB IAB Ectopic Multiple Live Births  0 0 0 0 1    # Outcome Date GA Lbr Len/2nd Weight Sex Type Anes PTL Lv  1 Term 11/09/23 [redacted]w[redacted]d  6 lb 11.9 oz (3.06 kg) F Vag-Spont EPI  LIV   Physical Assessment:   Vitals:   12/15/23 0846  BP: 111/67  Pulse: 70  Weight: 245 lb 3.2 oz (111.2 kg)  Height: 5\' 5"  (1.651 m)  Body mass index is 40.8 kg/m.       Physical Examination:   General appearance: alert,  well appearing, and in no distress  Mental status: alert, oriented to person, place, and time  Skin: warm & dry   Cardiovascular: normal heart rate noted   Respiratory: normal respiratory effort, no distress   Breasts: deferred, no complaints   Abdomen: soft, non-tender   Pelvic: lac healing well, suture still visible. Thin prep pap obtained: No  Rectal: not examined  Extremities: Edema: none        Results for orders placed or performed in visit on 12/15/23 (from the past 24 hours)  POCT urine pregnancy   Collection Time: 12/15/23  8:53 AM  Result Value Ref Range   Preg Test, Ur Negative Negative  POCT Urinalysis Dipstick   Collection Time: 12/15/23  9:49 AM  Result Value Ref Range   Color, UA     Clarity, UA     Glucose, UA Negative Negative   Bilirubin, UA     Ketones, UA neg    Spec Grav, UA     Blood, UA neg    pH, UA     Protein, UA Positive (A) Negative   Urobilinogen, UA     Nitrite, UA positive    Leukocytes, UA Small (1+) (A) Negative   Appearance     Odor       IUD INSERTION The risks and benefits of the method and placement have been thouroughly reviewed with the patient and all questions were answered.  Specifically the patient is aware of failure rate of 08/998, expulsion of the IUD and of possible perforation.  The patient is aware of irregular bleeding due to the method and understands the incidence of irregular bleeding diminishes with time.  Signed copy of informed consent in chart.   Time out was performed.  A graves speculum was placed in the vagina.  The cervix was visualized, prepped using Betadine, and grasped with a single tooth tenaculum. The uterus was found to be neutral and it sounded to 9 cm.  Mirena  IUD placed per manufacturer's recommendations. The strings were trimmed to approximately 3 cm. The patient tolerated the procedure well.   U/S unavailable, will do when she returns in 4wks  Chaperone: Lorean Rodes  Assessment & Plan:  1)  Postpartum exam 2) 5 wks s/p spontaneous vaginal delivery after IOL for [facility] and T2DM 3) breast & bottle feeding> milk tips given 4) Depression screening 5) Mirena IUD insertion The patient was given post procedure instructions, including signs and symptoms of infection and to check for the strings after each menses or each month, and refraining from intercourse or anything in the vagina for 3 days.  Condoms for 2 weeks. She  was given a care card with date IUD placed, and date IUD to be removed.  She is scheduled for a f/u appointment in 4 weeks. 6) UTI> rx macrobid  (baby >93mth old), send cx 7) CHTN> was on norvasc  10mg  daily prior to pregnancy, continue, f/u w/ PCP prn 8) Prediabetes/tx as T2DM during pregnancy> on metformin  500mg  daily prior to pregnancy, continue, f/u w/ PCP prn 9) Dep/anx/PPD> IBH referral, continue lexapro  15mg  daily (doesn't want to increase right now), let us  know if changes mind, f/u 4wks at IUD f/u  Essential components of care per ACOG recommendations:  1.  Mood and well being:  If positive depression screen, discussed and plan developed.  If using tobacco we discussed reduction/cessation and risk of relapse If current substance abuse, we discussed and referral to local resources was offered.   2. Infant care and feeding:  If breastfeeding, discussed returning to work, pumping, breastfeeding-associated pain, guidance regarding return to fertility while lactating if not using another method. If needed, patient was provided with a letter to be allowed to pump q 2-3hrs to support lactation in a private location with access to a refrigerator to store breastmilk.   Recommended that all caregivers be immunized for flu, pertussis and other preventable communicable diseases If pt does not have material needs met for her/baby, referred to local resources for help obtaining these.  3. Sexuality, contraception and birth spacing Provided guidance regarding sexuality, management  of dyspareunia, and resumption of intercourse Discussed avoiding interpregnancy interval <9mths and recommended birth spacing of 18 months  4. Sleep and fatigue Discussed coping options for fatigue and sleep disruption Encouraged family/partner/community support of 4 hrs of uninterrupted sleep to help with mood and fatigue  5. Physical recovery  If pt had a C/S, assessed incisional pain and providing guidance on normal vs prolonged recovery If pt had a laceration, perineal healing and pain reviewed.  If urinary or fecal incontinence, discussed management and referred to PT or uro/gyn if indicated  Patient is safe to resume physical activity. Discussed attainment of healthy weight.  6.  Chronic disease management Discussed pregnancy complications if any, and their implications for future childbearing and long-term maternal health. Review recommendations for prevention of recurrent pregnancy complications, such as 17 hydroxyprogesterone caproate to reduce risk for recurrent PTB not applicable, or aspirin  to reduce risk of preeclampsia yes. Pt had GDM: No. If yes, 2hr GTT scheduled: not applicable. Reviewed medications and non-pregnant dosing including consideration of whether pt is breastfeeding using a reliable resource such as LactMed: yes Referred for f/u w/ PCP or subspecialist providers as indicated: yes  7. Health maintenance Mammogram at 30yo or earlier if indicated Pap smears as indicated  Meds:  Meds ordered this encounter  Medications   levonorgestrel (MIRENA) 20 MCG/DAY IUD 1 each   nitrofurantoin , macrocrystal-monohydrate, (MACROBID ) 100 MG capsule    Sig: Take 1 capsule (100 mg total) by mouth 2 (two) times daily. X 7 days    Dispense:  14 capsule    Refill:  0    Follow-up: Return in about 4 weeks (around 01/12/2024) for gyn u/s (IUD f/u), depression f/u w/ CNM, in person.   Orders Placed This Encounter  Procedures   Urine Culture   Amb ref to [facility]   POCT urine pregnancy   POCT Urinalysis Dipstick    Ferd Householder CNM, [facility] 12/15/2023 9:57 AM

## 2023-12-17 ENCOUNTER — Other Ambulatory Visit

## 2023-12-17 DIAGNOSIS — Z8632 Personal history of gestational diabetes: Secondary | ICD-10-CM | POA: Diagnosis not present

## 2023-12-17 DIAGNOSIS — Z131 Encounter for screening for diabetes mellitus: Secondary | ICD-10-CM | POA: Diagnosis not present

## 2023-12-18 ENCOUNTER — Other Ambulatory Visit (HOSPITAL_COMMUNITY): Payer: Self-pay

## 2023-12-18 ENCOUNTER — Encounter (HOSPITAL_COMMUNITY): Payer: Self-pay

## 2023-12-18 LAB — URINE CULTURE

## 2023-12-18 LAB — GLUCOSE TOLERANCE, 2 HOURS W/ 1HR
Glucose, 1 hour: 136 mg/dL (ref 70–179)
Glucose, 2 hour: 91 mg/dL (ref 70–152)
Glucose, Fasting: 85 mg/dL (ref 70–91)

## 2023-12-21 ENCOUNTER — Encounter: Payer: Self-pay | Admitting: Women's Health

## 2023-12-21 ENCOUNTER — Other Ambulatory Visit: Payer: Self-pay

## 2023-12-22 ENCOUNTER — Other Ambulatory Visit: Payer: Self-pay

## 2023-12-23 ENCOUNTER — Telehealth: Payer: Self-pay | Admitting: Clinical

## 2023-12-23 NOTE — Telephone Encounter (Signed)
Attempt call regarding referral; Left HIPPA-compliant message to call back Mikle Sternberg from [facility] at  [phone] (Ashelyn Mccravy's office).    

## 2024-01-07 ENCOUNTER — Other Ambulatory Visit: Payer: Self-pay

## 2024-01-07 ENCOUNTER — Encounter: Payer: Self-pay | Admitting: Family Medicine

## 2024-01-07 MED ORDER — ESCITALOPRAM OXALATE 10 MG PO TABS
15.0000 mg | ORAL_TABLET | Freq: Every day | ORAL | 1 refills | Status: DC
  Filled 2024-01-07 – 2024-01-27 (×4): qty 45, 30d supply, fill #0
  Filled 2024-02-21: qty 45, 30d supply, fill #1

## 2024-01-07 MED ORDER — AMLODIPINE BESYLATE 10 MG PO TABS
10.0000 mg | ORAL_TABLET | Freq: Every day | ORAL | 2 refills | Status: AC
  Filled 2024-01-07: qty 90, 90d supply, fill #0
  Filled 2024-04-03: qty 90, 90d supply, fill #1
  Filled 2024-07-07: qty 90, 90d supply, fill #2

## 2024-01-07 MED ORDER — METFORMIN HCL 500 MG PO TABS
500.0000 mg | ORAL_TABLET | Freq: Every day | ORAL | 2 refills | Status: AC
  Filled 2024-01-07: qty 90, 90d supply, fill #0
  Filled 2024-04-03: qty 90, 90d supply, fill #1
  Filled 2024-07-07: qty 90, 90d supply, fill #2

## 2024-01-08 ENCOUNTER — Other Ambulatory Visit (HOSPITAL_COMMUNITY): Payer: Self-pay

## 2024-01-11 ENCOUNTER — Other Ambulatory Visit: Payer: Self-pay | Admitting: Obstetrics & Gynecology

## 2024-01-11 DIAGNOSIS — Z6841 Body Mass Index (BMI) 40.0 and over, adult: Secondary | ICD-10-CM

## 2024-01-11 DIAGNOSIS — Z30431 Encounter for routine checking of intrauterine contraceptive device: Secondary | ICD-10-CM

## 2024-01-12 ENCOUNTER — Ambulatory Visit: Admitting: Radiology

## 2024-01-12 ENCOUNTER — Other Ambulatory Visit: Payer: Self-pay

## 2024-01-12 ENCOUNTER — Ambulatory Visit: Admitting: Advanced Practice Midwife

## 2024-01-12 ENCOUNTER — Other Ambulatory Visit (HOSPITAL_COMMUNITY): Payer: Self-pay

## 2024-01-12 ENCOUNTER — Encounter: Payer: Self-pay | Admitting: Advanced Practice Midwife

## 2024-01-12 VITALS — BP 106/74 | HR 61 | Ht 65.0 in | Wt 252.4 lb

## 2024-01-12 DIAGNOSIS — Z30431 Encounter for routine checking of intrauterine contraceptive device: Secondary | ICD-10-CM | POA: Diagnosis not present

## 2024-01-12 DIAGNOSIS — Z6841 Body Mass Index (BMI) 40.0 and over, adult: Secondary | ICD-10-CM

## 2024-01-12 NOTE — Progress Notes (Addendum)
 GYN US : TA and TV imaging performed - vinyl probe cover used - Chaperone: Keila Anteverted uterus normal in size, symmetrical, homogeneous myometrium, no focal abn seen.  IUD in normal position, upper mid cavity, arms out Endom thickness = 3.6 mm, uniform avascular cavity and canal, no evidence of intracavitary defects Nl ov's, mobile, neg adnexal regions, neg CDS, no free fluid present

## 2024-01-12 NOTE — Progress Notes (Signed)
 GYN VISIT Patient name: Alexandra Jones MRN [id number]  Date of birth: 1996-04-09 Chief Complaint:   IUD Check  and Pregnancy Ultrasound  History of Present Illness:   Alexandra Jones is a 28 y.o. G23P1001 Caucasian female being seen today for f/u IUD Mirena .    No LMP recorded. (Menstrual status: IUD). The current method of family planning is IUD.  Last pap June 2023. Results were: NILM w/ HRHPV negative     05/20/2023   10:36 AM 05/15/2023    3:44 PM 03/13/2023    9:14 AM 10/07/2022   11:07 AM 05/13/2022    8:30 AM  Depression screen PHQ 2/9  Decreased Interest 1 1 1 1  0  Down, Depressed, Hopeless 1 2 1  0 0  PHQ - 2 Score 2 3 2 1  0  Altered sleeping 0 2 1 1    Tired, decreased energy 2 2 2 1    Change in appetite 0 0 1 0   Feeling bad or failure about yourself  1 1 1  0   Trouble concentrating 0 0 0 0   Moving slowly or fidgety/restless 0 0 0 0   Suicidal thoughts 0 0 0 0   PHQ-9 Score 5 8 7 3    Difficult doing work/chores  Somewhat difficult Not difficult at all Somewhat difficult         05/20/2023   10:36 AM 05/15/2023    3:44 PM 10/07/2022   11:07 AM 02/04/2022    8:36 AM  GAD 7 : Generalized Anxiety Score  Nervous, Anxious, on Edge 2 2 1 2   Control/stop worrying 1 2 2 2   Worry too much - different things 2 2 2 2   Trouble relaxing 0 1 1 1   Restless 0 0 1 1  Easily annoyed or irritable 1 2 0 1  Afraid - awful might happen 1 1 2 2   Total GAD 7 Score 7 10 9 11   Anxiety Difficulty  Somewhat difficult Somewhat difficult      Review of Systems:   Pertinent items are noted in HPI Denies fever/chills, dizziness, headaches, visual disturbances, fatigue, shortness of breath, chest pain, abdominal pain, vomiting, abnormal vaginal discharge/itching/odor/irritation, problems with periods, bowel movements, urination, or intercourse unless otherwise stated above.  Pertinent History Reviewed:  Reviewed past medical,surgical, social, obstetrical and family history.  Reviewed problem  list, medications and allergies. Physical Assessment:   Vitals:   01/12/24 1052  BP: 106/74  Pulse: 61  Weight: 252 lb 6.4 oz (114.5 kg)  Height: 5\' 5"  (1.651 m)  Body mass index is 42 kg/m.       Physical Examination:   General appearance: alert, well appearing, and in no distress  Mental status: alert, oriented to person, place, and time  Skin: warm & dry   Cardiovascular: normal heart rate noted  Respiratory: normal respiratory effort, no distress  Abdomen: soft, non-tender   Pelvic: normal external genitalia, vulva, vagina, cervix, uterus and adnexa; string ~3cm from os  Extremities: no edema   Chaperone: N/A  No results found for this or any previous visit (from the past 24 hours).  Assessment & Plan:  1) IUD f/u> strings as described at insertion; doing well; some spotting earlier but none now; no pain or discomfort   Meds: No orders of the defined types were placed in this encounter.   No orders of the defined types were placed in this encounter.   Return in about 1 year (around 01/11/2025) for Pap & Physical.  Jolayne Natter CNM 01/12/2024 11:26 AM

## 2024-01-15 ENCOUNTER — Other Ambulatory Visit (HOSPITAL_COMMUNITY): Payer: Self-pay

## 2024-01-27 ENCOUNTER — Other Ambulatory Visit (HOSPITAL_COMMUNITY): Payer: Self-pay

## 2024-01-27 ENCOUNTER — Other Ambulatory Visit: Payer: Self-pay

## 2024-02-06 ENCOUNTER — Ambulatory Visit
Admission: EM | Admit: 2024-02-06 | Discharge: 2024-02-06 | Disposition: A | Discharge status: Home / Self Care | Attending: Family Medicine | Admitting: Family Medicine

## 2024-02-06 DIAGNOSIS — J329 Chronic sinusitis, unspecified: Secondary | ICD-10-CM | POA: Diagnosis not present

## 2024-02-06 DIAGNOSIS — H66003 Acute suppurative otitis media without spontaneous rupture of ear drum, bilateral: Secondary | ICD-10-CM | POA: Diagnosis not present

## 2024-02-06 DIAGNOSIS — B9789 Other viral agents as the cause of diseases classified elsewhere: Secondary | ICD-10-CM | POA: Diagnosis not present

## 2024-02-06 MED ORDER — AZELASTINE HCL 0.1 % NA SOLN: 1.0000 | Freq: Two times a day (BID) | NASAL | 0 refills | Status: DC

## 2024-02-06 MED ORDER — AZITHROMYCIN 250 MG PO TABS: ORAL_TABLET | ORAL | 0 refills | Status: DC

## 2024-02-06 NOTE — ED Triage Notes (Signed)
 Pt reports ear pain in th left ear, fullness in the right ear pressure in both ears and sinus drainage x 5 days

## 2024-02-06 NOTE — Discharge Instructions (Signed)
 In addition to the prescribed medications, continue your Claritin and may take Delsym, plain Mucinex , Tylenol  and ibuprofen  as needed.  You may also do sinus rinses with warm saline as well as humidifiers

## 2024-02-06 NOTE — ED Provider Notes (Signed)
 [facility]    CSN: [id number] Arrival date & time: 02/06/24  1451      History   Chief Complaint No chief complaint on file.   HPI Alexandra Jones is a 28 y.o. female.   Patient presenting today with sinus drainage, sinus pressure, sore throat, postnasal drainage, bilateral ear pain and pressure that is progressively worsening over the past 5 days.  Denies fever, chills, chest pain, shortness of breath, abdominal pain, vomiting, diarrhea.  So far trying Claritin with minimal relief.  Of note, is currently breast-feeding.    Past Medical History:  Diagnosis Date   Anxiety disorder 03/14/2019   Last Assessment & Plan:  Controlled with Lexapro  5 mg PO QD.   Diarrhea 05/01/2017   GERD (gastroesophageal reflux disease)    Gestational diabetes    High blood pressure    Lactose intolerance     Patient Active Problem List   Diagnosis Date Noted   Depression with anxiety 12/15/2023   Postpartum depression 12/15/2023   Encounter for IUD insertion 12/15/2023   History of gestational diabetes 10/27/2023   Cystic fibrosis carrier 06/08/2023   Carrier of spinal muscular atrophy 06/08/2023   Chronic otitis externa 05/17/2023   Essential hypertension 05/13/2022   Mixed hyperlipidemia 05/13/2022   Vitamin D  deficiency 03/13/2022   Pre-diabetes 10/02/2020   Class 3 severe obesity with serious comorbidity and body mass index (BMI) of 40.0 to 44.9 in adult 10/01/2020   GERD (gastroesophageal reflux disease)     Past Surgical History:  Procedure Laterality Date   CHOLECYSTECTOMY     COLONOSCOPY N/A 06/22/2017   Procedure: COLONOSCOPY;  Surgeon: Harvey Margo CROME, MD;  Location: [facility];  Service: Endoscopy;  Laterality: N/A;  1:45pm   WISDOM TOOTH EXTRACTION      OB History     Gravida  1   Para  1   Term  1   Preterm  0   AB  0   Living  1      SAB  0   IAB  0   Ectopic  0   Multiple  0   Live Births  1            Home Medications     Prior to Admission medications   Medication Sig Start Date End Date Taking? Authorizing Provider  azelastine (ASTELIN) 0.1 % nasal spray Place 1 spray into both nostrils 2 (two) times daily. Use in each nostril as directed 02/06/24  Yes Stuart Vernell Norris, PA-C  azithromycin  (ZITHROMAX ) 250 MG tablet Take first 2 tablets together, then 1 every day until finished. 02/06/24  Yes Stuart Vernell Norris, PA-C  amLODipine  (NORVASC ) 10 MG tablet Take 1 tablet (10 mg total) by mouth daily. 01/07/24   Cook, Jayce G, DO  escitalopram  (LEXAPRO ) 10 MG tablet Take 1.5 tablets (15 mg total) by mouth daily. 01/07/24   Cook, Jayce G, DO  famotidine  (PEPCID ) 40 MG tablet Take 40 mg by mouth daily.    [provider]  ibuprofen  (ADVIL ) 600 MG tablet Take 1 tablet (600 mg total) by mouth every 6 (six) hours. 11/10/23   Cresenzo-Dishmon, Cathlean, CNM  loratadine (CLARITIN) 10 MG tablet Take 10 mg by mouth daily.    [provider]  metFORMIN  (GLUCOPHAGE ) 500 MG tablet Take 1 tablet (500 mg total) by mouth at bedtime. 01/07/24   Cook, Jayce G, DO  Prenatal Vit-Fe Fumarate-FA (MULTIVITAMIN-PRENATAL) 27-0.8 MG TABS tablet Take 1 tablet by mouth daily  at 12 noon.    [provider]  Probiotic Product (PROBIOTIC ADVANCED PO) Take by mouth.    [provider]    Family History Family History  Problem Relation Age of Onset   High blood pressure Mother    High Cholesterol Mother    Thyroid  disease Mother    Obesity Mother    Prostate cancer Father    High blood pressure Father    High Cholesterol Father    Depression Father    Obesity Father    Hypertension Maternal Grandmother    Breast cancer Maternal Grandmother 25   Kidney failure Maternal Grandmother    Colon cancer Maternal Grandfather 60   Heart failure Maternal Grandfather    Prostate cancer Maternal Grandfather    Celiac disease Neg Hx    Crohn's disease Neg Hx    Ulcerative colitis Neg Hx     Social  History Social History   Tobacco Use   Smoking status: Never   Smokeless tobacco: Never  Vaping Use   Vaping status: Never Used  Substance Use Topics   Alcohol use: Not Currently    Comment: occasional   Drug use: No     Allergies   Hydrocodone  and Penicillins   Review of Systems Review of Systems Per HPI  Physical Exam Triage Vital Signs ED Triage Vitals  Encounter Vitals Group     BP 02/06/24 1458 (!) 141/84     Girls Systolic BP Percentile --      Girls Diastolic BP Percentile --      Boys Systolic BP Percentile --      Boys Diastolic BP Percentile --      Pulse Rate 02/06/24 1458 82     Resp 02/06/24 1458 16     Temp 02/06/24 1458 97.9 F (36.6 C)     Temp Source 02/06/24 1458 Oral     SpO2 02/06/24 1458 96 %     Weight --      Height --      Head Circumference --      Peak Flow --      Pain Score 02/06/24 1501 6     Pain Loc --      Pain Education --      Exclude from Growth Chart --    No data found.  Updated Vital Signs BP (!) 141/84 (BP Location: Right Arm)   Pulse 82   Temp 97.9 F (36.6 C) (Oral)   Resp 16   SpO2 96%   Breastfeeding Yes   Visual Acuity Right Eye Distance:   Left Eye Distance:   Bilateral Distance:    Right Eye Near:   Left Eye Near:    Bilateral Near:     Physical Exam Vitals and nursing note reviewed.  Constitutional:      Appearance: Normal appearance.  HENT:     Head: Atraumatic.     Right Ear: External ear normal.     Left Ear: External ear normal.     Ears:     Comments: Bilateral TMs erythematous and edematous    Nose: Rhinorrhea present.     Mouth/Throat:     Mouth: Mucous membranes are moist.     Pharynx: Posterior oropharyngeal erythema present.   Eyes:     Extraocular Movements: Extraocular movements intact.     Conjunctiva/sclera: Conjunctivae normal.    Cardiovascular:     Rate and Rhythm: Normal rate and regular rhythm.     Heart sounds:  Normal heart sounds.  Pulmonary:     Effort:  Pulmonary effort is normal.     Breath sounds: Normal breath sounds. No wheezing or rales.   Musculoskeletal:        General: Normal range of motion.     Cervical back: Normal range of motion and neck supple.   Skin:    General: Skin is warm and dry.   Neurological:     Mental Status: She is alert and oriented to person, place, and time.   Psychiatric:        Mood and Affect: Mood normal.        Thought Content: Thought content normal.      UC Treatments / Results  Labs (all labs ordered are listed, but only abnormal results are displayed) Labs Reviewed - No data to display  EKG   Radiology No results found.  Procedures Procedures (including critical care time)  Medications Ordered in UC Medications - No data to display  Initial Impression / Assessment and Plan / UC Course  I have reviewed the triage vital signs and the nursing notes.  Pertinent labs & imaging results that were available during my care of the patient were reviewed by me and considered in my medical decision making (see chart for details).     Suspect viral sinusitis causing bilateral ear infection.  Treat with Zithromax , Astelin, allergy medication daily, supportive home care.  Return for worsening symptoms.  Final Clinical Impressions(s) / UC Diagnoses   Final diagnoses:  Viral sinusitis  Non-recurrent acute suppurative otitis media of both ears without spontaneous rupture of tympanic membranes     Discharge Instructions      In addition to the prescribed medications, continue your Claritin and may take Delsym, plain Mucinex , Tylenol  and ibuprofen  as needed.  You may also do sinus rinses with warm saline as well as humidifiers    ED Prescriptions     Medication Sig Dispense Auth. Provider   azelastine (ASTELIN) 0.1 % nasal spray Place 1 spray into both nostrils 2 (two) times daily. Use in each nostril as directed 30 mL Stuart Vernell Norris, PA-C   azithromycin  (ZITHROMAX ) 250 MG  tablet Take first 2 tablets together, then 1 every day until finished. 6 tablet Stuart Vernell Norris, [place]      PDMP not reviewed this encounter.   Stuart Vernell Norris, [place] 02/06/24 1556

## 2024-02-22 ENCOUNTER — Other Ambulatory Visit (HOSPITAL_COMMUNITY): Payer: Self-pay

## 2024-03-20 ENCOUNTER — Other Ambulatory Visit: Payer: Self-pay | Admitting: Family Medicine

## 2024-03-21 ENCOUNTER — Other Ambulatory Visit (HOSPITAL_COMMUNITY): Payer: Self-pay

## 2024-03-21 MED ORDER — ESCITALOPRAM OXALATE 10 MG PO TABS
15.0000 mg | ORAL_TABLET | Freq: Every day | ORAL | 3 refills | Status: AC
  Filled 2024-03-21: qty 135, 90d supply, fill #0
  Filled 2024-06-21: qty 135, 90d supply, fill #1

## 2024-04-04 ENCOUNTER — Telehealth: Payer: Self-pay | Admitting: Clinical

## 2024-04-04 ENCOUNTER — Other Ambulatory Visit (HOSPITAL_COMMUNITY): Payer: Self-pay

## 2024-04-04 NOTE — Telephone Encounter (Signed)
Attempt call regarding referral; Left HIPPA-compliant message to call back Mikle Sternberg from [facility] at  [phone] (Ashelyn Mccravy's office).    

## 2024-05-30 ENCOUNTER — Ambulatory Visit: Admitting: Family Medicine

## 2024-05-30 ENCOUNTER — Other Ambulatory Visit (HOSPITAL_BASED_OUTPATIENT_CLINIC_OR_DEPARTMENT_OTHER): Payer: Self-pay

## 2024-05-30 VITALS — BP 125/83 | HR 69 | Temp 98.4°F | Ht 65.0 in | Wt 274.4 lb

## 2024-05-30 DIAGNOSIS — R7303 Prediabetes: Secondary | ICD-10-CM

## 2024-05-30 DIAGNOSIS — E782 Mixed hyperlipidemia: Secondary | ICD-10-CM | POA: Diagnosis not present

## 2024-05-30 DIAGNOSIS — Z862 Personal history of diseases of the blood and blood-forming organs and certain disorders involving the immune mechanism: Secondary | ICD-10-CM | POA: Diagnosis not present

## 2024-05-30 DIAGNOSIS — B369 Superficial mycosis, unspecified: Secondary | ICD-10-CM | POA: Diagnosis not present

## 2024-05-30 DIAGNOSIS — Z23 Encounter for immunization: Secondary | ICD-10-CM | POA: Diagnosis not present

## 2024-05-30 DIAGNOSIS — Z0000 Encounter for general adult medical examination without abnormal findings: Secondary | ICD-10-CM

## 2024-05-30 DIAGNOSIS — Z0001 Encounter for general adult medical examination with abnormal findings: Secondary | ICD-10-CM

## 2024-05-30 DIAGNOSIS — M7712 Lateral epicondylitis, left elbow: Secondary | ICD-10-CM

## 2024-05-30 DIAGNOSIS — H6240 Otitis externa in other diseases classified elsewhere, unspecified ear: Secondary | ICD-10-CM | POA: Diagnosis not present

## 2024-05-30 DIAGNOSIS — M7711 Lateral epicondylitis, right elbow: Secondary | ICD-10-CM

## 2024-05-30 DIAGNOSIS — I10 Essential (primary) hypertension: Secondary | ICD-10-CM

## 2024-05-30 DIAGNOSIS — Z13220 Encounter for screening for lipoid disorders: Secondary | ICD-10-CM | POA: Diagnosis not present

## 2024-05-30 MED ORDER — CLOTRIMAZOLE 1 % EX SOLN
CUTANEOUS | 0 refills | Status: AC
  Filled 2024-05-30: qty 10, 10d supply, fill #0

## 2024-05-30 MED ORDER — DICLOFENAC SODIUM 75 MG PO TBEC
75.0000 mg | DELAYED_RELEASE_TABLET | Freq: Two times a day (BID) | ORAL | 0 refills | Status: AC
  Filled 2024-05-30: qty 30, 15d supply, fill #0

## 2024-05-30 NOTE — Patient Instructions (Signed)
 Labs today.  Meds sent in.  If ear continues to be troublesome, please let me know.  Follow up annually.

## 2024-05-31 ENCOUNTER — Other Ambulatory Visit (HOSPITAL_BASED_OUTPATIENT_CLINIC_OR_DEPARTMENT_OTHER): Payer: Self-pay

## 2024-05-31 DIAGNOSIS — M7710 Lateral epicondylitis, unspecified elbow: Secondary | ICD-10-CM | POA: Insufficient documentation

## 2024-05-31 DIAGNOSIS — B369 Superficial mycosis, unspecified: Secondary | ICD-10-CM | POA: Insufficient documentation

## 2024-05-31 DIAGNOSIS — Z0000 Encounter for general adult medical examination without abnormal findings: Secondary | ICD-10-CM | POA: Insufficient documentation

## 2024-05-31 LAB — LIPID PANEL
Chol/HDL Ratio: 5 ratio — ABNORMAL HIGH (ref 0.0–4.4)
Cholesterol, Total: 184 mg/dL (ref 100–199)
HDL: 37 mg/dL — ABNORMAL LOW (ref >39)
LDL Chol Calc (NIH): 93 mg/dL (ref 0–99)
Triglycerides: 326 mg/dL — ABNORMAL HIGH (ref 0–149)
VLDL Cholesterol Cal: 54 mg/dL — ABNORMAL HIGH (ref 5–40)

## 2024-05-31 LAB — CMP14+EGFR
ALT: 25 IU/L (ref 0–32)
AST: 21 IU/L (ref 0–40)
Albumin: 4.6 g/dL (ref 4.0–5.0)
Alkaline Phosphatase: 90 IU/L (ref 41–116)
BUN/Creatinine Ratio: 13 (ref 9–23)
BUN: 9 mg/dL (ref 6–20)
Bilirubin Total: 0.2 mg/dL (ref 0.0–1.2)
CO2: 23 mmol/L (ref 20–29)
Calcium: 9.4 mg/dL (ref 8.7–10.2)
Chloride: 102 mmol/L (ref 96–106)
Creatinine, Ser: 0.68 mg/dL (ref 0.57–1.00)
Globulin, Total: 2.8 g/dL (ref 1.5–4.5)
Glucose: 93 mg/dL (ref 70–99)
Potassium: 3.8 mmol/L (ref 3.5–5.2)
Sodium: 140 mmol/L (ref 134–144)
Total Protein: 7.4 g/dL (ref 6.0–8.5)
eGFR: 120 mL/min/1.73 (ref >59)

## 2024-05-31 LAB — CBC
Hematocrit: 40.8 % (ref 34.0–46.6)
Hemoglobin: 13.3 g/dL (ref 11.1–15.9)
MCH: 29 pg (ref 26.6–33.0)
MCHC: 32.6 g/dL (ref 31.5–35.7)
MCV: 89 fL (ref 79–97)
Platelets: 244 x10E3/uL (ref 150–450)
RBC: 4.58 x10E6/uL (ref 3.77–5.28)
RDW: 13.7 % (ref 11.7–15.4)
WBC: 7.6 x10E3/uL (ref 3.4–10.8)

## 2024-05-31 LAB — HEMOGLOBIN A1C
Est. average glucose Bld gHb Est-mCnc: 120 mg/dL
Hgb A1c MFr Bld: 5.8 % — ABNORMAL HIGH (ref 4.8–5.6)

## 2024-05-31 NOTE — Assessment & Plan Note (Signed)
 Treating with Diclofenac.

## 2024-05-31 NOTE — Progress Notes (Signed)
 Subjective:  Patient ID: Alexandra Jones, female    DOB: January 14, 1996  Age: 28 y.o. MRN: [id number]  CC:   Chief Complaint  Patient presents with   Ear Pain    Left pain (feels stopped up and drainage). Bilateral elbow pain     HPI:  28 year old female presents for an annual exam.  In regards to her preventative care, she needs flu vaccine. Remainder of preventative health care items up to date.   She has 2 complaints today. Reports a 3 month history of bilateral elbow pain (lateral epicondyle). She has tried exercises and dry needling without resolution.   Also reports that her left ear feels stopped up. This has been bothering her for 3 days. She has used ear drops (prior Rx) without resolution.  Patient Active Problem List   Diagnosis Date Noted   Annual physical exam 05/31/2024   Otomycosis 05/31/2024   Lateral epicondylitis 05/31/2024   Depression with anxiety 12/15/2023   Postpartum depression 12/15/2023   Encounter for IUD insertion 12/15/2023   History of gestational diabetes 10/27/2023   Cystic fibrosis carrier 06/08/2023   Carrier of spinal muscular atrophy 06/08/2023   Essential hypertension 05/13/2022   Mixed hyperlipidemia 05/13/2022   Vitamin D  deficiency 03/13/2022   Class 3 severe obesity with serious comorbidity and body mass index (BMI) of 40.0 to 44.9 in adult [facility]) 10/01/2020   GERD (gastroesophageal reflux disease)     Social Hx   Social History   Socioeconomic History   Marital status: Significant Other    Spouse name: Not on file   Number of children: Not on file   Years of education: Not on file   Highest education level: Not on file  Occupational History   Occupation: Occupational Therapist  Tobacco Use   Smoking status: Never   Smokeless tobacco: Never  Vaping Use   Vaping status: Never Used  Substance and Sexual Activity   Alcohol use: Not Currently    Comment: occasional   Drug use: No   Sexual activity: Yes    Birth  control/protection: I.U.D.  Other Topics Concern   Not on file  Social History Narrative   Not on file   Social Drivers of Health   Financial Resource Strain: Low Risk  (05/20/2023)   Overall Financial Resource Strain (CARDIA)    Difficulty of Paying Living Expenses: Not hard at all  Food Insecurity: No Food Insecurity (11/08/2023)   Hunger Vital Sign    Worried About Running Out of Food in the Last Year: Never true    Ran Out of Food in the Last Year: Never true  Transportation Needs: No Transportation Needs (11/08/2023)   PRAPARE - Administrator, Civil Service (Medical): No    Lack of Transportation (Non-Medical): No  Physical Activity: Inactive (05/20/2023)   Exercise Vital Sign    Days of Exercise per Week: 0 days    Minutes of Exercise per Session: 0 min  Stress: Stress Concern Present (05/20/2023)   [employer] of Occupational Health - Occupational Stress Questionnaire    Feeling of Stress : Rather much  Social Connections: Moderately Integrated (11/08/2023)   Social Connection and Isolation Panel    Frequency of Communication with Friends and Family: More than three times a week    Frequency of Social Gatherings with Friends and Family: More than three times a week    Attends Religious Services: 1 to 4 times per year    Active Member of  Clubs or Organizations: No    Attends Banker Meetings: Never    Marital Status: Living with partner    Review of Systems Per HPI  Objective:  BP 125/83 (BP Location: Left Arm, Patient Position: Sitting)   Pulse 69   Temp 98.4 F (36.9 C)   Ht 5' 5 (1.651 m)   Wt 274 lb 6 oz (124.5 kg)   SpO2 100%   BMI 45.66 kg/m      05/30/2024    1:20 PM 02/06/2024    2:58 PM 01/12/2024   10:52 AM  BP/Weight  Systolic BP 125 141 106  Diastolic BP 83 84 74  Wt. (Lbs) 274.38  252.4  BMI 45.66 kg/m2  42 kg/m2    Physical Exam Vitals and nursing note reviewed.  Constitutional:      General: She is not in  acute distress.    Appearance: Normal appearance. She is obese.  HENT:     Head: Normocephalic and atraumatic.     Ears:     Comments: Left ear - otomycosis noted. Eyes:     General:        Right eye: No discharge.        Left eye: No discharge.     Conjunctiva/sclera: Conjunctivae normal.  Cardiovascular:     Rate and Rhythm: Normal rate and regular rhythm.  Pulmonary:     Effort: Pulmonary effort is normal.     Breath sounds: Normal breath sounds.  Musculoskeletal:     Comments: Lateral epicondyle tenderness to palpation bilaterally.   Neurological:     Mental Status: She is alert.  Psychiatric:        Mood and Affect: Mood normal.        Behavior: Behavior normal.     Lab Results  Component Value Date   WBC 7.6 05/30/2024   HGB 13.3 05/30/2024   HCT 40.8 05/30/2024   PLT 244 05/30/2024   GLUCOSE 93 05/30/2024   CHOL 184 05/30/2024   TRIG 326 (H) 05/30/2024   HDL 37 (L) 05/30/2024   LDLCALC 93 05/30/2024   ALT 25 05/30/2024   AST 21 05/30/2024   NA 140 05/30/2024   K 3.8 05/30/2024   CL 102 05/30/2024   CREATININE 0.68 05/30/2024   BUN 9 05/30/2024   CO2 23 05/30/2024   TSH 1.770 09/30/2022   HGBA1C 5.8 (H) 05/30/2024     Assessment & Plan:  Annual physical exam Assessment & Plan: Flu vaccine given. Remainder of preventative health care up to date.   Immunization due -     Flu vaccine trivalent PF, 6mos and older(Flulaval,Afluria,Fluarix,Fluzone)  Mixed hyperlipidemia  Essential hypertension -     CMP14+EGFR  Prediabetes -     Hemoglobin A1c  Screening, lipid -     Lipid panel  History of anemia -     CBC  Otomycosis Assessment & Plan: Treating with Clotrimazole .   Orders: -     Clotrimazole ; 3 drops to affected ear twice daily for 10 days.  Dispense: 30 mL; Refill: 0  Lateral epicondylitis of both elbows Assessment & Plan: Treating with Diclofenac.  Orders: -     Diclofenac Sodium; Take 1 tablet (75 mg total) by mouth 2 (two)  times daily.  Dispense: 30 tablet; Refill: 0    Follow-up:  Annually  Jacqulyn Ahle DO [facility]

## 2024-05-31 NOTE — Assessment & Plan Note (Signed)
 Flu vaccine given. Remainder of preventative health care up to date.

## 2024-05-31 NOTE — Assessment & Plan Note (Signed)
Treating with Clotrimazole.   

## 2024-06-01 ENCOUNTER — Ambulatory Visit: Payer: Self-pay | Admitting: Family Medicine

## 2024-06-14 ENCOUNTER — Encounter: Payer: Self-pay | Admitting: Family Medicine

## 2024-06-15 ENCOUNTER — Other Ambulatory Visit: Payer: Self-pay | Admitting: Family Medicine

## 2024-06-15 DIAGNOSIS — B369 Superficial mycosis, unspecified: Secondary | ICD-10-CM

## 2024-06-21 ENCOUNTER — Other Ambulatory Visit (HOSPITAL_COMMUNITY): Payer: Self-pay

## 2024-06-24 ENCOUNTER — Other Ambulatory Visit (HOSPITAL_BASED_OUTPATIENT_CLINIC_OR_DEPARTMENT_OTHER): Payer: Self-pay

## 2024-06-29 ENCOUNTER — Other Ambulatory Visit: Payer: Self-pay

## 2024-06-29 ENCOUNTER — Other Ambulatory Visit (HOSPITAL_COMMUNITY): Payer: Self-pay

## 2024-06-29 ENCOUNTER — Encounter (INDEPENDENT_AMBULATORY_CARE_PROVIDER_SITE_OTHER): Payer: Self-pay | Admitting: Otolaryngology

## 2024-06-29 ENCOUNTER — Ambulatory Visit (INDEPENDENT_AMBULATORY_CARE_PROVIDER_SITE_OTHER): Admitting: Otolaryngology

## 2024-06-29 VITALS — BP 127/79 | HR 88 | Ht 65.0 in | Wt 270.0 lb

## 2024-06-29 DIAGNOSIS — H6123 Impacted cerumen, bilateral: Secondary | ICD-10-CM | POA: Diagnosis not present

## 2024-06-29 DIAGNOSIS — H608X3 Other otitis externa, bilateral: Secondary | ICD-10-CM | POA: Diagnosis not present

## 2024-06-29 DIAGNOSIS — H938X2 Other specified disorders of left ear: Secondary | ICD-10-CM

## 2024-06-29 MED ORDER — CIPROFLOXACIN-DEXAMETHASONE 0.3-0.1 % OT SUSP
4.0000 [drp] | Freq: Two times a day (BID) | OTIC | 1 refills | Status: AC
  Filled 2024-06-29: qty 7.5, 14d supply, fill #0

## 2024-06-29 MED ORDER — BETAMETHASONE DIPROPIONATE 0.05 % EX CREA
TOPICAL_CREAM | Freq: Two times a day (BID) | CUTANEOUS | 0 refills | Status: AC
  Filled 2024-06-29: qty 30, 14d supply, fill #0

## 2024-06-29 NOTE — Patient Instructions (Signed)
 Starting black Friday, use 4 drops of ciprodex  in each ear twice daily for 2 weeks; right after, use betamethasone  cream just to outside of ear twice daily for 2 weeks (pea sized amt)

## 2024-06-29 NOTE — Progress Notes (Signed)
 Dear Dr. Bluford, Here is my assessment for our mutual patient, Alexandra Jones. Thank you for allowing me the opportunity to care for your patient. Please do not hesitate to contact me should you have any other questions. Sincerely, Dr. Eldora Blanch  Otolaryngology Clinic Note Referring provider: Dr. Bluford HPI:  Initial visit (06/2024): Discussed the use of AI scribe software for clinical note transcription with the patient, who gave verbal consent to proceed.  History of Present Illness Alexandra Jones is a 28 year old female with recurrent ear infections who presents with bilateral ear drainage and itching.  She experiences bilateral ear drainage and itching for several months, with the left ear feeling particularly clogged. She has been feeling as if the left ear is stopped up, and itches and drains. Has been on otic drops, and recently on Clotrimazole  drops, which provided temporary relief, but symptoms returned after discontinuation. Ciprodex  drops have been helpful during severe drainage episodes. She extended the use of the drops without significant improvement. Previous treatments for bacterial and fungal infections included CSF powder (Dr. Mable -- saw in 2021). The drainage is usually not purulence, but appears like an exudate.  No antecedent event. There is no recent illness, middle ear infections, ear surgery, or use of ear tubes. She experiences occasional vertigo and a woozy sensation when her ears are clogged, but no significant hearing loss, tinnitus, or ear popping.  She denies diabetes and reports seasonal allergies with nasal dryness. 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    Patient also denies barotrauma, vestibular suppressant use, ototoxic medication use Prior ear surgery: no    H&N Surgery: no Personal or FHx of bleeding dz or anesthesia difficulty: no  GLP-1: no AP/AC: no  Tobacco: no  PMHx: HTN, GERD, MDD/GAD, HLD,  PreDM  Independent Review of Additional Tests or Records:  Dr. Bluford (05/30/2024): noted left ear feels stopped up, used ear drops without resolution; Dx: Otomycosis, Rx: Clotrimazole ; ref to ENT 06/14/2024 Dr. Bluford: Noted ear drops finished but ear still itching and draining; ref to ENT CBC and BMP 05/30/2024: WBC 7.6, Hgb 13.3, Plt 244 Dr. Mable (07/16/2020): itching and fullness right ear, muffled hearing and intense itching; some debris, otherwise mild erythema; recommend acetasol HC drops and hydrocortisone cream PMH/Meds/All/SocHx/FamHx/ROS:   Past Medical History:  Diagnosis Date   Anxiety disorder 03/14/2019   Last Assessment & Plan:  Controlled with Lexapro  5 mg PO QD.   Diarrhea 05/01/2017   GERD (gastroesophageal reflux disease)    Gestational diabetes    High blood pressure    Lactose intolerance      Past Surgical History:  Procedure Laterality Date   CHOLECYSTECTOMY     COLONOSCOPY N/A 06/22/2017   Procedure: COLONOSCOPY;  Surgeon: Harvey Margo CROME, MD;  Location: [facility];  Service: Endoscopy;  Laterality: N/A;  1:45pm   WISDOM TOOTH EXTRACTION      Family History  Problem Relation Age of Onset   High blood pressure Mother    High Cholesterol Mother    Thyroid  disease Mother    Obesity Mother    Prostate cancer Father    High blood pressure Father    High Cholesterol Father    Depression Father    Obesity Father    Hypertension Maternal Grandmother    Breast cancer Maternal Grandmother 75   Kidney failure Maternal Grandmother    Colon cancer Maternal Grandfather 60   Heart failure Maternal Grandfather  Prostate cancer Maternal Grandfather    Celiac disease Neg Hx    Crohn's disease Neg Hx    Ulcerative colitis Neg Hx      Social Connections: Moderately Integrated (11/08/2023)   Social Connection and Isolation Panel    Frequency of Communication with Friends and Family: More than three times a week    Frequency of Social Gatherings with Friends  and Family: More than three times a week    Attends Religious Services: 1 to 4 times per year    Active Member of [employer] or Organizations: No    Attends Banker Meetings: Never    Marital Status: Living with partner      Current Outpatient Medications:    amLODipine  (NORVASC ) 10 MG tablet, Take 1 tablet (10 mg total) by mouth daily., Disp: 90 tablet, Rfl: 2   betamethasone  dipropionate 0.05 % cream, Apply topically 2 (two) times daily for 14 days. Apply just to outside of ear canal for 2 weeks, Disp: 30 g, Rfl: 0   ciprofloxacin -dexamethasone  (CIPRODEX ) OTIC suspension, Place 4 drops into both ears 2 (two) times daily for 14 days., Disp: 7.5 mL, Rfl: 1   clotrimazole  (LOTRIMIN ) 1 % external solution, 3 drops to affected ear twice daily for 10 days., Disp: 30 mL, Rfl: 0   diclofenac  (VOLTAREN ) 75 MG EC tablet, Take 1 tablet (75 mg total) by mouth 2 (two) times daily., Disp: 30 tablet, Rfl: 0   escitalopram  (LEXAPRO ) 10 MG tablet, Take 1.5 tablets (15 mg total) by mouth daily., Disp: 135 tablet, Rfl: 3   famotidine  (PEPCID ) 40 MG tablet, Take 40 mg by mouth daily., Disp: , Rfl:    loratadine (CLARITIN) 10 MG tablet, Take 10 mg by mouth daily., Disp: , Rfl:    metFORMIN  (GLUCOPHAGE ) 500 MG tablet, Take 1 tablet (500 mg total) by mouth at bedtime., Disp: 90 tablet, Rfl: 2   Probiotic Product (PROBIOTIC ADVANCED PO), Take by mouth., Disp: , Rfl:    Physical Exam:   BP 127/79 (BP Location: Right Arm, Patient Position: Sitting, Cuff Size: Large)   Pulse 88   Ht 5' 5 (1.651 m)   Wt 270 lb (122.5 kg)   SpO2 94%   BMI 44.93 kg/m   Salient findings:  CN II-XII intact Port wine stain right infraauricular area and cheek Given history and complaints, ear microscopy was indicated and performed for evaluation with findings as below in physical exam section and in procedures; noted bilateral EAC impaction with wet debris, cleared; visualized TM intact with well pneumatized middle ear  spaces it appears; modest eczematoid change b/l; no fungal elements appreciated; CSF powder placed both ears Weber 512: mid Rinne 512: AC > BC b/l  Anterior rhinoscopy: Septum intact; bilateral inferior turbinates without significant hypertrophy; modest dryness No lesions of oral cavity/oropharynx No obviously palpable neck masses/lymphadenopathy/thyromegaly No respiratory distress or stridor    Seprately Identifiable Procedures:  Prior to initiating any procedures, risks/benefits/alternatives were explained to the patient and verbal consent obtained. Procedure: Bilateral ear microscopy and cerumen removal using microscope (CPT [id number]) - Mod 25 Pre-procedure diagnosis: Cerumen impaction bilateral external ears Post-procedure diagnosis: same Indication: bilateral cerumen impaction; given patient's otologic complaints and history as well as for improved and comprehensive examination of external ear and tympanic membrane, bilateral otologic examination using microscope was performed and impacted cerumen removed  Procedure: Patient was placed semi-recumbent. Both ear canals were examined using the microscope with findings above. Impacted Cerumen removed on left and on right using  suction and currette with improvement in EAC examination and patency. Patient tolerated the procedure well.      Impression & Plans:  Lochlyn Zullo is a 28 y.o. female with:  1. Sensation of fullness in left ear   2. Chronic eczematous otitis externa of both ears   3. Bilateral impacted cerumen    Appears to have sx for man years with exacerbations. Ears cleaned, likely eczematoid otitis. Will treat medically and see how she responds.  - Cleaned ear canals to remove debris. - CSF powder placed b/l - Keep ear dry - Prescribed Ciprodex  drops for use after one week for 2 weeks - Prescribed betamethasone  cream for topical application after one week x2 weeks  F/u ~4 weeks  See below regarding exact medications  prescribed this encounter including dosages and route: Meds ordered this encounter  Medications   ciprofloxacin -dexamethasone  (CIPRODEX ) OTIC suspension    Sig: Place 4 drops into both ears 2 (two) times daily for 14 days.    Dispense:  7.5 mL    Refill:  1   betamethasone  dipropionate 0.05 % cream    Sig: Apply topically 2 (two) times daily for 14 days. Apply just to outside of ear canal for 2 weeks    Dispense:  30 g    Refill:  0      Thank you for allowing me the opportunity to care for your patient. Please do not hesitate to contact me should you have any other questions.  Sincerely, Eldora Blanch, MD Otolaryngologist (ENT), [facility] Phone: [phone] Fax: [phone]  07/16/2024, 9:20 AM   MDM:  Level 4 - [phone] Complexity/Problems addressed: mod - chronic problem with exacerbation Data complexity: mod - independent review of note, labs - Morbidity: mod  - Prescription Drug prescribed or managed: y

## 2024-07-08 ENCOUNTER — Other Ambulatory Visit (HOSPITAL_COMMUNITY): Payer: Self-pay

## 2024-07-25 ENCOUNTER — Other Ambulatory Visit (HOSPITAL_BASED_OUTPATIENT_CLINIC_OR_DEPARTMENT_OTHER): Payer: Self-pay

## 2024-07-25 ENCOUNTER — Ambulatory Visit (INDEPENDENT_AMBULATORY_CARE_PROVIDER_SITE_OTHER): Admitting: Otolaryngology

## 2024-07-25 ENCOUNTER — Encounter (INDEPENDENT_AMBULATORY_CARE_PROVIDER_SITE_OTHER): Payer: Self-pay | Admitting: Otolaryngology

## 2024-07-25 VITALS — BP 140/85 | HR 102 | Ht 65.0 in | Wt 270.0 lb

## 2024-07-25 DIAGNOSIS — H938X2 Other specified disorders of left ear: Secondary | ICD-10-CM

## 2024-07-25 DIAGNOSIS — H608X3 Other otitis externa, bilateral: Secondary | ICD-10-CM | POA: Diagnosis not present

## 2024-07-25 MED ORDER — FLUOCINOLONE ACETONIDE 0.01 % OT OIL
4.0000 [drp] | TOPICAL_OIL | OTIC | 5 refills | Status: AC
  Filled 2024-07-25: qty 20, 30d supply, fill #0

## 2024-07-25 NOTE — Progress Notes (Unsigned)
 Dear Dr. Bluford, Here is my assessment for our mutual patient, Alexandra Jones. Thank you for allowing me the opportunity to care for your patient. Please do not hesitate to contact me should you have any other questions. Sincerely, Dr. Eldora Blanch  Otolaryngology Clinic Note Referring provider: Dr. Bluford HPI:  Initial visit (06/2024): Discussed the use of AI scribe software for clinical note transcription with the patient, who gave verbal consent to proceed.  History of Present Illness Alexandra Jones is a 28 year old female with recurrent ear infections who presents with bilateral ear drainage and itching.  She experiences bilateral ear drainage and itching for several months, with the left ear feeling particularly clogged. She has been feeling as if the left ear is stopped up, and itches and drains. Has been on otic drops, and recently on Clotrimazole  drops, which provided temporary relief, but symptoms returned after discontinuation. Ciprodex  drops have been helpful during severe drainage episodes. She extended the use of the drops without significant improvement. Previous treatments for bacterial and fungal infections included CSF powder (Dr. Mable -- saw in 2021). The drainage is usually not purulence, but appears like an exudate.  No antecedent event. There is no recent illness, middle ear infections, ear surgery, or use of ear tubes. She experiences occasional vertigo and a woozy sensation when her ears are clogged, but no significant hearing loss, tinnitus, or ear popping.  She denies diabetes and reports seasonal allergies with nasal dryness. 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    Patient also denies barotrauma, vestibular suppressant use, ototoxic medication use Prior ear surgery: no  --------------------------------------------------------- 07/25/2024 Doing much better from ear standpoint; itching and drainage significantly better.  Clogging is also better.  H&N Surgery: no Personal or FHx of bleeding dz or anesthesia difficulty: no  GLP-1: no AP/AC: no  Tobacco: no  PMHx: HTN, GERD, MDD/GAD, HLD, PreDM  Independent Review of Additional Tests or Records:  Dr. Bluford (05/30/2024): noted left ear feels stopped up, used ear drops without resolution; Dx: Otomycosis, Rx: Clotrimazole ; ref to ENT 06/14/2024 Dr. Bluford: Noted ear drops finished but ear still itching and draining; ref to ENT CBC and BMP 05/30/2024: WBC 7.6, Hgb 13.3, Plt 244 Dr. Mable (07/16/2020): itching and fullness right ear, muffled hearing and intense itching; some debris, otherwise mild erythema; recommend acetasol HC drops and hydrocortisone cream PMH/Meds/All/SocHx/FamHx/ROS:   Past Medical History:  Diagnosis Date   Anxiety disorder 03/14/2019   Last Assessment & Plan:  Controlled with Lexapro  5 mg PO QD.   Diarrhea 05/01/2017   GERD (gastroesophageal reflux disease)    Gestational diabetes    High blood pressure    Lactose intolerance      Past Surgical History:  Procedure Laterality Date   CHOLECYSTECTOMY     COLONOSCOPY N/A 06/22/2017   Procedure: COLONOSCOPY;  Surgeon: Harvey Margo CROME, MD;  Location: [facility];  Service: Endoscopy;  Laterality: N/A;  1:45pm   WISDOM TOOTH EXTRACTION      Family History  Problem Relation Age of Onset   High blood pressure Mother    High Cholesterol Mother    Thyroid  disease Mother    Obesity Mother    Prostate cancer Father    High blood pressure Father    High Cholesterol Father    Depression Father    Obesity Father    Hypertension Maternal Grandmother    Breast cancer Maternal Grandmother 62   Kidney failure  Maternal Grandmother    Colon cancer Maternal Grandfather 60   Heart failure Maternal Grandfather    Prostate cancer Maternal Grandfather    Celiac disease Neg Hx    Crohn's disease Neg Hx    Ulcerative colitis Neg Hx      Social Connections: Moderately Integrated  (11/08/2023)   Social Connection and Isolation Panel    Frequency of Communication with Friends and Family: More than three times a week    Frequency of Social Gatherings with Friends and Family: More than three times a week    Attends Religious Services: 1 to 4 times per year    Active Member of [employer] or Organizations: No    Attends Banker Meetings: Never    Marital Status: Living with partner      Current Outpatient Medications:    amLODipine  (NORVASC ) 10 MG tablet, Take 1 tablet (10 mg total) by mouth daily., Disp: 90 tablet, Rfl: 2   clotrimazole  (LOTRIMIN ) 1 % external solution, 3 drops to affected ear twice daily for 10 days., Disp: 30 mL, Rfl: 0   diclofenac  (VOLTAREN ) 75 MG EC tablet, Take 1 tablet (75 mg total) by mouth 2 (two) times daily., Disp: 30 tablet, Rfl: 0   escitalopram  (LEXAPRO ) 10 MG tablet, Take 1.5 tablets (15 mg total) by mouth daily., Disp: 135 tablet, Rfl: 3   famotidine  (PEPCID ) 40 MG tablet, Take 40 mg by mouth daily., Disp: , Rfl:    Fluocinolone  Acetonide (DERMOTIC ) 0.01 % OIL, Place 4 drops in ear(s) 2 (two) times a week., Disp: 20 mL, Rfl: 5   loratadine (CLARITIN) 10 MG tablet, Take 10 mg by mouth daily., Disp: , Rfl:    metFORMIN  (GLUCOPHAGE ) 500 MG tablet, Take 1 tablet (500 mg total) by mouth at bedtime., Disp: 90 tablet, Rfl: 2   Probiotic Product (PROBIOTIC ADVANCED PO), Take by mouth., Disp: , Rfl:    Physical Exam:   BP (!) 140/85 (BP Location: Right Arm, Patient Position: Sitting, Cuff Size: Large)   Pulse (!) 102   Ht 5' 5 (1.651 m)   Wt 270 lb (122.5 kg)   SpO2 90%   BMI 44.93 kg/m   Salient findings:  CN II-XII intact Port wine stain right infraauricular area and cheek Given history and complaints, ear microscopy was indicated and performed for evaluation with findings as below in physical exam section and in procedures; EAC with modest eczematoid change but look significantly better, no debris; TM intact with well  pneumatized middle ear spaces it appears Weber 512: mid Rinne 512: AC > BC b/l  No respiratory distress or stridor    Seprately Identifiable Procedures:  Prior to initiating any procedures, risks/benefits/alternatives were explained to the patient and verbal consent obtained. Procedure: Bilateral ear microscopy using microscope (CPT [id number]) Pre-procedure diagnosis: bilateral eczematoid otitis externa Post-procedure diagnosis: same Indication: see above; given patient's otologic complaints and history, for improved and comprehensive examination of external ear and tympanic membrane, bilateral otologic examination using microscope was performed. Prior to proceeding, verbal consent was obtained after discussion of R/B/A  Procedure: Patient was placed semi-recumbent. Both ear canals were examined using the microscope with findings above. Patient tolerated the procedure well.       Impression & Plans:  Gael Delude is a 28 y.o. female with:  1. Sensation of fullness in left ear   2. Chronic eczematous otitis externa of both ears    Appears to have sx for man years with exacerbations.  Doing significantly better  after CSF powder and EOE Rx - Will do ciprodex  1 more week, then dermotic  twice weekly - f/u as needed; d/w pt HT, she declined  See below regarding exact medications prescribed this encounter including dosages and route: Meds ordered this encounter  Medications   Fluocinolone  Acetonide (DERMOTIC ) 0.01 % OIL    Sig: Place 4 drops in ear(s) 2 (two) times a week.    Dispense:  20 mL    Refill:  5      Thank you for allowing me the opportunity to care for your patient. Please do not hesitate to contact me should you have any other questions.  Sincerely, Eldora Blanch, MD Otolaryngologist (ENT), [facility] Phone: [phone] Fax: [phone]  07/25/2024, 4:24 PM   MDM:  I have personally spent 31 minutes involved in face-to-face and  non-face-to-face activities for this patient on the day of the visit.  Professional time spent excludes any procedures performed but includes the following activities, in addition to those noted in the documentation: preparing to see the patient (review of outside documentation and results), performing a medically appropriate examination, counseling and coming up with a plan, ordering medications (dermotic ), documenting in the electronic health record

## 2024-07-25 NOTE — Patient Instructions (Signed)
 Use ciprodex  drops 4 drops twice daily in each ear for 1 week, then use dermotic  oil 4 drops twice weekly; if symptoms worsen, use it regularly.

## 2024-07-26 ENCOUNTER — Other Ambulatory Visit (HOSPITAL_BASED_OUTPATIENT_CLINIC_OR_DEPARTMENT_OTHER): Payer: Self-pay

## 2024-08-08 ENCOUNTER — Other Ambulatory Visit (HOSPITAL_BASED_OUTPATIENT_CLINIC_OR_DEPARTMENT_OTHER): Payer: Self-pay

## 2024-08-09 ENCOUNTER — Other Ambulatory Visit (HOSPITAL_BASED_OUTPATIENT_CLINIC_OR_DEPARTMENT_OTHER): Payer: Self-pay
